# Patient Record
Sex: Female | Born: 1965
Health system: Southern US, Community
[De-identification: ages and names within clinical notes are randomized; demographics above are authoritative.]

## PROBLEM LIST (undated history)

## (undated) DIAGNOSIS — R05 Cough: Secondary | ICD-10-CM

## (undated) DIAGNOSIS — R209 Unspecified disturbances of skin sensation: Secondary | ICD-10-CM

## (undated) DIAGNOSIS — K219 Gastro-esophageal reflux disease without esophagitis: Secondary | ICD-10-CM

## (undated) DIAGNOSIS — R252 Cramp and spasm: Secondary | ICD-10-CM

## (undated) DIAGNOSIS — M79609 Pain in unspecified limb: Secondary | ICD-10-CM

## (undated) DIAGNOSIS — E785 Hyperlipidemia, unspecified: Secondary | ICD-10-CM

## (undated) DIAGNOSIS — D259 Leiomyoma of uterus, unspecified: Secondary | ICD-10-CM

## (undated) DIAGNOSIS — Z90710 Acquired absence of both cervix and uterus: Secondary | ICD-10-CM

## (undated) DIAGNOSIS — R5383 Other fatigue: Secondary | ICD-10-CM

## (undated) DIAGNOSIS — R259 Unspecified abnormal involuntary movements: Secondary | ICD-10-CM

## (undated) DIAGNOSIS — N926 Irregular menstruation, unspecified: Secondary | ICD-10-CM

## (undated) DIAGNOSIS — R1031 Right lower quadrant pain: Secondary | ICD-10-CM

## (undated) DIAGNOSIS — E669 Obesity, unspecified: Secondary | ICD-10-CM

## (undated) DIAGNOSIS — E559 Vitamin D deficiency, unspecified: Secondary | ICD-10-CM

## (undated) DIAGNOSIS — R5381 Other malaise: Secondary | ICD-10-CM

## (undated) DIAGNOSIS — D509 Iron deficiency anemia, unspecified: Secondary | ICD-10-CM

## (undated) DIAGNOSIS — F909 Attention-deficit hyperactivity disorder, unspecified type: Secondary | ICD-10-CM

## (undated) DIAGNOSIS — G4700 Insomnia, unspecified: Secondary | ICD-10-CM

## (undated) HISTORY — DX: Unspecified disturbances of skin sensation: R20.9

## (undated) HISTORY — DX: Gastro-esophageal reflux disease without esophagitis: K21.9

## (undated) HISTORY — DX: Unspecified abnormal involuntary movements: R25.9

## (undated) HISTORY — DX: Obesity, unspecified: E66.9

## (undated) HISTORY — DX: Hyperlipidemia, unspecified: E78.5

## (undated) HISTORY — DX: Cough: R05

## (undated) HISTORY — DX: Iron deficiency anemia, unspecified: D50.9

## (undated) HISTORY — DX: Vitamin D deficiency, unspecified: E55.9

## (undated) HISTORY — DX: Attention-deficit hyperactivity disorder, unspecified type: F90.9

## (undated) HISTORY — DX: Insomnia, unspecified: G47.00

## (undated) HISTORY — PX: CYST REMOVAL HAND: SHX6279

## (undated) HISTORY — DX: Right lower quadrant pain: R10.31

## (undated) HISTORY — PX: BREAST SURGERY: SHX581

## (undated) HISTORY — DX: Pain in unspecified limb: M79.609

## (undated) HISTORY — DX: Other malaise: R53.81

## (undated) HISTORY — DX: Irregular menstruation, unspecified: N92.6

## (undated) HISTORY — DX: Cramp and spasm: R25.2

## (undated) HISTORY — DX: Acquired absence of both cervix and uterus: Z90.710

## (undated) HISTORY — PX: OOPHORECTOMY: SHX86

## (undated) HISTORY — DX: Leiomyoma of uterus, unspecified: D25.9

## (undated) HISTORY — PX: CHOLECYSTECTOMY: SHX55

## (undated) HISTORY — DX: Other fatigue: R53.83

---

## 2002-08-11 ENCOUNTER — Other Ambulatory Visit: Admission: RE | Admit: 2002-08-11 | Discharge: 2002-08-11 | Payer: Self-pay | Admitting: Obstetrics & Gynecology

## 2003-10-04 ENCOUNTER — Other Ambulatory Visit: Admission: RE | Admit: 2003-10-04 | Discharge: 2003-10-04 | Payer: Self-pay | Admitting: Obstetrics & Gynecology

## 2005-05-06 ENCOUNTER — Ambulatory Visit: Payer: Self-pay | Admitting: Family Medicine

## 2005-10-06 ENCOUNTER — Encounter: Admission: RE | Admit: 2005-10-06 | Discharge: 2005-10-06 | Payer: Self-pay | Admitting: Obstetrics & Gynecology

## 2005-11-16 ENCOUNTER — Ambulatory Visit: Payer: Self-pay | Admitting: Internal Medicine

## 2007-02-09 ENCOUNTER — Encounter: Admission: RE | Admit: 2007-02-09 | Discharge: 2007-02-09 | Payer: Self-pay | Admitting: Obstetrics & Gynecology

## 2007-08-08 DIAGNOSIS — E785 Hyperlipidemia, unspecified: Secondary | ICD-10-CM | POA: Insufficient documentation

## 2007-08-08 DIAGNOSIS — K219 Gastro-esophageal reflux disease without esophagitis: Secondary | ICD-10-CM

## 2007-08-08 HISTORY — DX: Hyperlipidemia, unspecified: E78.5

## 2007-08-08 HISTORY — DX: Gastro-esophageal reflux disease without esophagitis: K21.9

## 2007-11-16 ENCOUNTER — Ambulatory Visit: Payer: Self-pay | Admitting: Internal Medicine

## 2007-11-16 DIAGNOSIS — G4700 Insomnia, unspecified: Secondary | ICD-10-CM | POA: Insufficient documentation

## 2007-11-16 HISTORY — DX: Insomnia, unspecified: G47.00

## 2007-12-19 ENCOUNTER — Ambulatory Visit: Payer: Self-pay | Admitting: Internal Medicine

## 2008-03-01 ENCOUNTER — Encounter: Admission: RE | Admit: 2008-03-01 | Discharge: 2008-03-01 | Payer: Self-pay | Admitting: Obstetrics & Gynecology

## 2008-11-26 ENCOUNTER — Encounter: Payer: Self-pay | Admitting: Internal Medicine

## 2009-03-04 ENCOUNTER — Encounter: Admission: RE | Admit: 2009-03-04 | Discharge: 2009-03-04 | Payer: Self-pay | Admitting: Obstetrics & Gynecology

## 2009-04-16 ENCOUNTER — Emergency Department (HOSPITAL_COMMUNITY): Admission: EM | Admit: 2009-04-16 | Discharge: 2009-04-16 | Payer: Self-pay | Admitting: Emergency Medicine

## 2009-04-17 ENCOUNTER — Telehealth (INDEPENDENT_AMBULATORY_CARE_PROVIDER_SITE_OTHER): Payer: Self-pay | Admitting: *Deleted

## 2009-04-18 ENCOUNTER — Ambulatory Visit: Payer: Self-pay | Admitting: Family Medicine

## 2009-05-03 ENCOUNTER — Ambulatory Visit: Payer: Self-pay | Admitting: Internal Medicine

## 2009-06-24 ENCOUNTER — Ambulatory Visit: Payer: Self-pay | Admitting: Internal Medicine

## 2009-06-24 DIAGNOSIS — E669 Obesity, unspecified: Secondary | ICD-10-CM

## 2009-06-24 DIAGNOSIS — R252 Cramp and spasm: Secondary | ICD-10-CM | POA: Insufficient documentation

## 2009-06-24 DIAGNOSIS — R5381 Other malaise: Secondary | ICD-10-CM | POA: Insufficient documentation

## 2009-06-24 DIAGNOSIS — N926 Irregular menstruation, unspecified: Secondary | ICD-10-CM | POA: Insufficient documentation

## 2009-06-24 DIAGNOSIS — R5383 Other fatigue: Secondary | ICD-10-CM

## 2009-06-24 HISTORY — DX: Other malaise: R53.81

## 2009-06-24 HISTORY — DX: Obesity, unspecified: E66.9

## 2009-06-24 HISTORY — DX: Cramp and spasm: R25.2

## 2009-06-24 HISTORY — DX: Irregular menstruation, unspecified: N92.6

## 2009-06-24 LAB — CONVERTED CEMR LAB
Albumin: 4.1 g/dL (ref 3.5–5.2)
Anti Nuclear Antibody(ANA): NEGATIVE
BUN: 9 mg/dL (ref 6–23)
Basophils Absolute: 0.2 10*3/uL — ABNORMAL HIGH (ref 0.0–0.1)
CRP, High Sensitivity: 5 (ref 0.00–5.00)
Calcium: 9.1 mg/dL (ref 8.4–10.5)
Cholesterol: 213 mg/dL — ABNORMAL HIGH (ref 0–200)
Eosinophils Absolute: 0.2 10*3/uL (ref 0.0–0.7)
Free T4: 0.9 ng/dL (ref 0.6–1.6)
GFR calc non Af Amer: 96.15 mL/min (ref >60)
Glucose, Bld: 79 mg/dL (ref 70–99)
HDL: 57.1 mg/dL (ref >39.00)
Hgb A1c MFr Bld: 5.9 % (ref 4.6–6.5)
Lymphocytes Relative: 19.2 % (ref 12.0–46.0)
MCHC: 34 g/dL (ref 30.0–36.0)
Monocytes Relative: 2.4 % — ABNORMAL LOW (ref 3.0–12.0)
Neutrophils Relative %: 75.7 % (ref 43.0–77.0)
Platelets: 395 10*3/uL (ref 150.0–400.0)
Potassium: 3.9 meq/L (ref 3.5–5.1)
RDW: 14.4 % (ref 11.5–14.6)
Sodium: 138 meq/L (ref 135–145)
Total Bilirubin: 0.8 mg/dL (ref 0.3–1.2)
Total CHOL/HDL Ratio: 4
Total CK: 87 units/L (ref 7–177)
VLDL: 10.8 mg/dL (ref 0.0–40.0)
Vit D, 25-Hydroxy: 23 ng/mL — ABNORMAL LOW (ref 30–89)
Vitamin B-12: 471 pg/mL (ref 211–911)

## 2009-06-26 ENCOUNTER — Encounter: Payer: Self-pay | Admitting: Internal Medicine

## 2009-07-05 ENCOUNTER — Telehealth: Payer: Self-pay | Admitting: *Deleted

## 2009-07-10 LAB — CONVERTED CEMR LAB: Volume, Urine-CORTUR: 2800 mL

## 2009-11-11 ENCOUNTER — Ambulatory Visit: Payer: Self-pay | Admitting: Internal Medicine

## 2009-11-11 LAB — CONVERTED CEMR LAB: Vit D, 25-Hydroxy: 26 ng/mL — ABNORMAL LOW (ref 30–89)

## 2010-01-07 ENCOUNTER — Telehealth: Payer: Self-pay | Admitting: *Deleted

## 2010-04-03 ENCOUNTER — Encounter: Admission: RE | Admit: 2010-04-03 | Discharge: 2010-04-03 | Payer: Self-pay | Admitting: Internal Medicine

## 2010-04-09 ENCOUNTER — Ambulatory Visit: Payer: Self-pay | Admitting: Family Medicine

## 2010-04-09 DIAGNOSIS — R059 Cough, unspecified: Secondary | ICD-10-CM

## 2010-04-09 DIAGNOSIS — R05 Cough: Secondary | ICD-10-CM

## 2010-04-09 HISTORY — DX: Cough, unspecified: R05.9

## 2010-05-22 ENCOUNTER — Telehealth: Payer: Self-pay | Admitting: Internal Medicine

## 2010-06-02 ENCOUNTER — Ambulatory Visit: Payer: Self-pay | Admitting: Internal Medicine

## 2010-06-02 DIAGNOSIS — E559 Vitamin D deficiency, unspecified: Secondary | ICD-10-CM

## 2010-06-02 HISTORY — DX: Vitamin D deficiency, unspecified: E55.9

## 2010-06-05 ENCOUNTER — Encounter: Payer: Self-pay | Admitting: *Deleted

## 2010-06-05 LAB — CONVERTED CEMR LAB
ALT: 31 U/L
AST: 17 U/L
Albumin: 3.5 g/dL
Alkaline Phosphatase: 63 U/L
BUN: 16 mg/dL
Basophils Absolute: 0.1 10*3/uL
Basophils Relative: 0.9 %
Bilirubin, Direct: 0.1 mg/dL
CO2: 26 meq/L
Calcium: 8.8 mg/dL
Chloride: 111 meq/L
Creatinine, Ser: 0.7 mg/dL
Eosinophils Absolute: 0.3 10*3/uL
Eosinophils Relative: 2.6 %
Ferritin: 8.3 ng/mL — ABNORMAL LOW
Free T4: 0.95 ng/dL
GFR calc non Af Amer: 99 mL/min
Glucose, Bld: 94 mg/dL
HCT: 36.3 %
Hemoglobin: 12.5 g/dL
Lymphocytes Relative: 22 %
Lymphs Abs: 2.4 10*3/uL
MCHC: 34.4 g/dL
MCV: 85.3 fL
Monocytes Absolute: 1 10*3/uL
Monocytes Relative: 9.7 %
Neutro Abs: 6.9 10*3/uL
Neutrophils Relative %: 64.8 %
Platelets: 431 10*3/uL — ABNORMAL HIGH
Potassium: 4.4 meq/L
RBC: 4.26 M/uL
RDW: 14.7 % — ABNORMAL HIGH
Sodium: 140 meq/L
T3, Free: 2.7 pg/mL
TSH: 1.05 u[IU]/mL
Total Bilirubin: 0.2 mg/dL — ABNORMAL LOW
Total Protein: 6.2 g/dL
WBC: 10.7 10*3/uL — ABNORMAL HIGH

## 2010-07-02 ENCOUNTER — Ambulatory Visit: Payer: Self-pay | Admitting: Internal Medicine

## 2010-07-02 DIAGNOSIS — R259 Unspecified abnormal involuntary movements: Secondary | ICD-10-CM

## 2010-07-02 DIAGNOSIS — D509 Iron deficiency anemia, unspecified: Secondary | ICD-10-CM | POA: Insufficient documentation

## 2010-07-02 HISTORY — DX: Unspecified abnormal involuntary movements: R25.9

## 2010-07-02 HISTORY — DX: Iron deficiency anemia, unspecified: D50.9

## 2010-08-08 ENCOUNTER — Telehealth: Payer: Self-pay | Admitting: *Deleted

## 2010-08-27 ENCOUNTER — Ambulatory Visit: Payer: Self-pay | Admitting: Internal Medicine

## 2010-08-27 LAB — CONVERTED CEMR LAB
Basophils Relative: 0.5 % (ref 0.0–3.0)
Eosinophils Relative: 3.3 % (ref 0.0–5.0)
Lymphocytes Relative: 23.6 % (ref 12.0–46.0)
Neutrophils Relative %: 63.6 % (ref 43.0–77.0)
RBC: 4.39 M/uL (ref 3.87–5.11)
Saturation Ratios: 26.8 % (ref 20.0–50.0)
Transferrin: 290.9 mg/dL (ref 212.0–360.0)
WBC: 8.8 10*3/uL (ref 4.5–10.5)

## 2010-09-02 HISTORY — PX: ABDOMINAL HYSTERECTOMY: SHX81

## 2010-09-03 ENCOUNTER — Ambulatory Visit: Payer: Self-pay | Admitting: Internal Medicine

## 2010-09-03 DIAGNOSIS — D259 Leiomyoma of uterus, unspecified: Secondary | ICD-10-CM | POA: Insufficient documentation

## 2010-09-03 HISTORY — DX: Leiomyoma of uterus, unspecified: D25.9

## 2010-09-17 ENCOUNTER — Encounter: Payer: Self-pay | Admitting: Internal Medicine

## 2010-09-18 ENCOUNTER — Encounter: Payer: Self-pay | Admitting: Internal Medicine

## 2010-09-24 ENCOUNTER — Ambulatory Visit (HOSPITAL_COMMUNITY): Admission: RE | Admit: 2010-09-24 | Discharge: 2010-09-24 | Payer: Self-pay | Admitting: Obstetrics & Gynecology

## 2010-09-24 ENCOUNTER — Encounter: Payer: Self-pay | Admitting: Internal Medicine

## 2010-09-24 ENCOUNTER — Encounter (INDEPENDENT_AMBULATORY_CARE_PROVIDER_SITE_OTHER): Payer: Self-pay | Admitting: Obstetrics & Gynecology

## 2010-10-02 ENCOUNTER — Telehealth: Payer: Self-pay | Admitting: Internal Medicine

## 2010-10-03 ENCOUNTER — Ambulatory Visit: Payer: Self-pay | Admitting: Internal Medicine

## 2010-10-03 ENCOUNTER — Ambulatory Visit: Payer: Self-pay

## 2010-10-03 DIAGNOSIS — R209 Unspecified disturbances of skin sensation: Secondary | ICD-10-CM

## 2010-10-03 DIAGNOSIS — Z90710 Acquired absence of both cervix and uterus: Secondary | ICD-10-CM

## 2010-10-03 DIAGNOSIS — M79609 Pain in unspecified limb: Secondary | ICD-10-CM | POA: Insufficient documentation

## 2010-10-03 DIAGNOSIS — R1031 Right lower quadrant pain: Secondary | ICD-10-CM | POA: Insufficient documentation

## 2010-10-03 HISTORY — DX: Right lower quadrant pain: R10.31

## 2010-10-03 HISTORY — DX: Unspecified disturbances of skin sensation: R20.9

## 2010-10-03 HISTORY — DX: Pain in unspecified limb: M79.609

## 2010-10-03 HISTORY — DX: Acquired absence of both cervix and uterus: Z90.710

## 2010-10-08 ENCOUNTER — Telehealth: Payer: Self-pay | Admitting: Internal Medicine

## 2010-10-17 ENCOUNTER — Encounter: Payer: Self-pay | Admitting: Family Medicine

## 2010-10-17 ENCOUNTER — Ambulatory Visit: Payer: Self-pay | Admitting: Family Medicine

## 2010-10-20 LAB — CONVERTED CEMR LAB
Basophils Relative: 0.7 % (ref 0.0–3.0)
Eosinophils Absolute: 0.2 10*3/uL (ref 0.0–0.7)
Eosinophils Relative: 2.4 % (ref 0.0–5.0)
HCT: 41 % (ref 36.0–46.0)
Hemoglobin: 13.7 g/dL (ref 12.0–15.0)
MCHC: 33.4 g/dL (ref 30.0–36.0)
MCV: 88.2 fL (ref 78.0–100.0)
Monocytes Absolute: 0.7 10*3/uL (ref 0.1–1.0)
Neutro Abs: 5.9 10*3/uL (ref 1.4–7.7)
RBC: 4.66 M/uL (ref 3.87–5.11)

## 2010-10-21 ENCOUNTER — Encounter
Admission: RE | Admit: 2010-10-21 | Discharge: 2010-10-21 | Payer: Self-pay | Source: Home / Self Care | Attending: Obstetrics & Gynecology | Admitting: Obstetrics & Gynecology

## 2010-10-30 ENCOUNTER — Ambulatory Visit
Admission: RE | Admit: 2010-10-30 | Discharge: 2010-10-30 | Payer: Self-pay | Source: Home / Self Care | Attending: Internal Medicine | Admitting: Internal Medicine

## 2010-11-06 ENCOUNTER — Ambulatory Visit
Admission: RE | Admit: 2010-11-06 | Discharge: 2010-11-06 | Payer: Self-pay | Source: Home / Self Care | Attending: Internal Medicine | Admitting: Internal Medicine

## 2010-11-10 ENCOUNTER — Encounter: Payer: Self-pay | Admitting: Internal Medicine

## 2010-11-23 ENCOUNTER — Encounter: Payer: Self-pay | Admitting: Obstetrics & Gynecology

## 2010-11-26 ENCOUNTER — Encounter: Payer: Self-pay | Admitting: Internal Medicine

## 2010-11-26 ENCOUNTER — Other Ambulatory Visit: Payer: Self-pay | Admitting: Internal Medicine

## 2010-11-26 ENCOUNTER — Ambulatory Visit
Admission: RE | Admit: 2010-11-26 | Discharge: 2010-11-26 | Payer: Self-pay | Source: Home / Self Care | Attending: Internal Medicine | Admitting: Internal Medicine

## 2010-11-26 LAB — CBC WITH DIFFERENTIAL/PLATELET
Basophils Absolute: 0.1 10*3/uL (ref 0.0–0.1)
Eosinophils Relative: 2.4 % (ref 0.0–5.0)
HCT: 38.3 % (ref 36.0–46.0)
Lymphs Abs: 2.1 10*3/uL (ref 0.7–4.0)
Monocytes Absolute: 0.7 10*3/uL (ref 0.1–1.0)
Monocytes Relative: 8 % (ref 3.0–12.0)
Neutrophils Relative %: 65.7 % (ref 43.0–77.0)
Platelets: 383 10*3/uL (ref 150.0–400.0)
RDW: 14.9 % — ABNORMAL HIGH (ref 11.5–14.6)
WBC: 8.8 10*3/uL (ref 4.5–10.5)

## 2010-11-26 LAB — IBC PANEL
Iron: 73 ug/dL (ref 42–145)
Saturation Ratios: 18.4 % — ABNORMAL LOW (ref 20.0–50.0)
Transferrin: 284 mg/dL (ref 212.0–360.0)

## 2010-11-26 LAB — FERRITIN: Ferritin: 17.4 ng/mL (ref 10.0–291.0)

## 2010-11-30 LAB — CONVERTED CEMR LAB
Alkaline Phosphatase: 110 units/L (ref 39–117)
Basophils Absolute: 0.1 10*3/uL (ref 0.0–0.1)
Bilirubin, Direct: 0.1 mg/dL (ref 0.0–0.3)
Eosinophils Relative: 1.9 % (ref 0.0–5.0)
GFR calc non Af Amer: 100.56 mL/min (ref >60)
Glucose, Bld: 123 mg/dL — ABNORMAL HIGH (ref 70–99)
Ketones, urine, test strip: NEGATIVE
MCV: 88.8 fL (ref 78.0–100.0)
Monocytes Absolute: 1.2 10*3/uL — ABNORMAL HIGH (ref 0.1–1.0)
Neutrophils Relative %: 77.4 % — ABNORMAL HIGH (ref 43.0–77.0)
Nitrite: NEGATIVE
Platelets: 509 10*3/uL — ABNORMAL HIGH (ref 150.0–400.0)
Potassium: 4.4 meq/L (ref 3.5–5.1)
RDW: 14.8 % — ABNORMAL HIGH (ref 11.5–14.6)
Sodium: 143 meq/L (ref 135–145)
TSH: 0.75 microintl units/mL (ref 0.35–5.50)
Total Bilirubin: 0.6 mg/dL (ref 0.3–1.2)
Urobilinogen, UA: 0.2
WBC: 14.7 10*3/uL — ABNORMAL HIGH (ref 4.5–10.5)

## 2010-12-02 NOTE — Assessment & Plan Note (Signed)
Summary: cough lingering/dm   Vital Signs:  Patient profile:   45 year old female Menstrual status:  irregular Weight:      202 pounds Temp:     98.1 degrees F oral BP sitting:   110 / 80  (left arm) Cuff size:   large CC: Lingering cough, Cough   History of Present Illness:  Cough      This is a 45 year old woman who presents with Cough.  The patient reports non-productive cough, shortness of breath, and wheezing, but denies productive cough, pleuritic chest pain, exertional dyspnea, fever, and hemoptysis.  Associated symtpoms include acid reflux symptoms.  The patient denies the following symptoms: cold/URI symptoms, sore throat, nasal congestion, chronic rhinitis, weight loss, and peripheral edema.  Risk factors include history of reflux.  Diagnostic testing to date has included CXR (at urgent care 3 months ago).  Cough of 3 months duration.  Has freq GERD symptoms usually controlled with H2 blocker. No smoking hx.  No hx of asthma.   Current Medications (verified): 1)  Vitamin D (Ergocalciferol) 50000 Unit Caps (Ergocalciferol) .Marland Kitchen.. 1 By Mouth Weekly  Allergies (verified): No Known Drug Allergies  Past History:  Past Medical History: Last updated: 08/08/2007 GERD Hyperlipidemia  Social History: Last updated: 06/24/2009 Occupation: Single Never Smoked Alcohol use-yes Drug use-no Regular exercise-yes HH of  1   3 [employer]  PMH reviewed for relevance, SH/Risk Factors reviewed for relevance  Review of Systems  The patient denies anorexia, fever, weight loss, hoarseness, chest pain, peripheral edema, and hemoptysis.    Physical Exam  General:  Well-developed,well-nourished,in no acute distress; alert,appropriate and cooperative throughout examination Head:  Normocephalic and atraumatic without obvious abnormalities. No apparent alopecia or balding. Ears:  External ear exam shows no significant lesions or deformities.  Otoscopic examination reveals clear canals,  tympanic membranes are intact bilaterally without bulging, retraction, inflammation or discharge. Hearing is grossly normal bilaterally. Mouth:  Oral mucosa and oropharynx without lesions or exudates.  Teeth in good repair. Neck:  No deformities, masses, or tenderness noted. Lungs:  Normal respiratory effort, chest expands symmetrically. Lungs are clear to auscultation, no crackles or wheezes. Heart:  Normal rate and regular rhythm. S1 and S2 normal without gallop, murmur, click, rub or other extra sounds. Extremities:  no edema   Impression & Recommendations:  Problem # 1:  COUGH, CHRONIC (ICD-786.2) Assessment New ?GERD related.  No hx asthma and does not appear to have any allergy or chronic sinusitis symptoms. Start PPI and diet discussed.  Elevate head of bed.  Cough syrup for suppression.  Problem # 2:  GERD (ICD-530.81)  Her updated medication list for this problem includes:    Omeprazole 20 Mg Cpdr (Omeprazole) ..... One by mouth once daily  Complete Medication List: 1)  Vitamin D (ergocalciferol) 50000 Unit Caps (Ergocalciferol) .Marland Kitchen.. 1 by mouth weekly 2)  Hydrocodone-homatropine 5-1.5 Mg/77ml Syrp (Hydrocodone-homatropine) .... One tsp by mouth q 4-6 hours as needed cough 3)  Omeprazole 20 Mg Cpdr (Omeprazole) .... One by mouth once daily  Patient Instructions: 1)  Elevate head of bed 6 to 8 inches. 2)  Take Omeprazole daily. 3)  Avoid eating within 3 hours of bedtime. 4)  Follow up with Dr Darrell Jewel in 3-4 weeks if no better. Prescriptions: VITAMIN D (ERGOCALCIFEROL) 50000 UNIT CAPS (ERGOCALCIFEROL) 1 by mouth weekly  #12 x 1   Entered and Authorized by:   Evelena Peat MD   Signed by:   Evelena Peat MD on 04/09/2010  Method used:   Electronically to        [facility]  [phone]* (retail)       [address] [place], [place]  [address]       Ph: [phone] or [phone]       Fax: [phone]   RxID:   [id number] OMEPRAZOLE 20 MG CPDR  (OMEPRAZOLE) one by mouth once daily  #30 x 3   Entered and Authorized by:   Evelena Peat MD   Signed by:   Evelena Peat MD on 04/09/2010   Method used:   Electronically to        [facility]  [phone]* (retail)       [address] [place], [place]  [address]       Ph: [phone] or [phone]       Fax: [phone]   RxID:   [id number] HYDROCODONE-HOMATROPINE 5-1.5 MG/5ML SYRP (HYDROCODONE-HOMATROPINE) one tsp by mouth q 4-6 hours as needed cough  #120 ml x 0   Entered and Authorized by:   Evelena Peat MD   Signed by:   Evelena Peat MD on 04/09/2010   Method used:   Print then Give to Patient   RxID:   [id number]

## 2010-12-02 NOTE — Consult Note (Signed)
Summary: [facility]  [facility]   Imported By: Maryln Gottron 09/23/2010 10:20:40  _____________________________________________________________________  External Attachment:    Type:   Image     Comment:   External Document

## 2010-12-02 NOTE — Assessment & Plan Note (Signed)
Summary: 1 month follow up/cjr   Vital Signs:  Patient profile:   45 year old female Menstrual status:  irregular Weight:      198 pounds Pulse rate:   86 / minute BP sitting:   110 / 80  (left arm) Cuff size:   regular  Vitals Entered By: Romualdo Bolk, CMA (AAMA) (July 02, 2010 4:06 PM) CC: follow-up visit   History of Present Illness: Tiffany Carson comes in today   for follow up of a number of problems. Since her last bisit sleep poss soome improvement . Asks about meds and melatonins to use. Also gets leg cramps and started iron.  Has righ had tremor for a while that is getting worse affecting her handwriting  . no weakness sudden onset or association with a given med.  GERD :   taking prilosec   with help  To have gyne  surgery at some point  for her fibroid poss contibuting to her  anemia.  Preventive Screening-Counseling & Management  Alcohol-Tobacco     Alcohol drinks/day: 0     Smoking Status: never  Caffeine-Diet-Exercise     Caffeine use/day: 0     Does Patient Exercise: yes     Type of exercise: walking     Times/week: 2  Current Medications (verified): 1)  Vitamin D (Ergocalciferol) 50000 Unit Caps (Ergocalciferol) .Marland Kitchen.. 1 By Mouth Weekly 2)  Omeprazole 20 Mg Cpdr (Omeprazole) .... One By Mouth Once Daily 3)  Ambien 10 Mg Tabs (Zolpidem Tartrate) .Marland Kitchen.. 1 To 1 and 1/2  By Mouth At Bedtime As Needed Sleep  Allergies (verified): 1)  ! Dexamethasone (Dexamethasone)  Past History:  Past medical, surgical, family and social histories (including risk factors) reviewed, and no changes noted (except as noted below).  Past Medical History: Reviewed history from 06/02/2010 and no changes required. GERD  fibroid tumor     may n  Past Surgical History: Reviewed history from 08/08/2007 and no changes required. Cholecystectomy Oophorectomy  Past History:  Care Management: Gynecology:[facility]  Urgent Care: [facility]   Family  History: Reviewed history from 11/16/2007 and no changes required. Family History of Alcoholism/Addiction bro Family History of Arthritis Family History Breast cancer 1st degree relative <50 Family History Diabetes 1st degree relative Family History High cholesterol Family History Hypertension Family History Ovarian cancer Family History of Sudden Death Family History Uterine cancer  no fam hx other mental illness  Social History: Reviewed history from 06/24/2009 and no changes required. Occupation: Single Never Smoked Alcohol use-yes Drug use-no Regular exercise-yes HH of  1   3 [employer]   Review of Systems  The patient denies anorexia, fever, weight loss, weight gain, vision loss, decreased hearing, chest pain, syncope, prolonged cough, melena, hematochezia, hematuria, difficulty walking, depression, enlarged lymph nodes, and angioedema.    Physical Exam  General:  alert, well-developed, well-nourished, and well-hydrated.   Eyes:  vision grossly intact.   Neck:  No deformities, masses, or tenderness noted. Lungs:  normal respiratory effort, no intercostal retractions, no accessory muscle use, and normal breath sounds.   Heart:  normal rate, regular rhythm, and no murmur.   Msk:  no joint warmth and no redness over joints.   Pulses:  pulses intact without delay   Extremities:  no clubbing cyanosis or edema  Neurologic:  alert & oriented X3, strength normal in all extremities, and gait normal.  right hand with a fine intention tremor  and handwriting  erratice  and halting  but  confluent  Skin:  turgor normal, color normal, no ecchymoses, no petechiae, and no purpura.   Cervical Nodes:  No lymphadenopathy noted Psych:  Oriented X3, normally interactive, good eye contact, not anxious appearing, and not depressed appearing.     Impression & Recommendations:  Problem # 1:  ANEMIA, IRON DEFICIENCY (ICD-280.9)  could be contributing to sleep issues   no t anemic  now but  is iron deficient   Hgb: 12.5 (06/02/2010)   Hct: 36.3 (06/02/2010)   Platelets: 431.0 (06/02/2010) RBC: 4.26 (06/02/2010)   RDW: 14.7 (06/02/2010)   WBC: 10.7 (06/02/2010) MCV: 85.3 (06/02/2010)   MCHC: 34.4 (06/02/2010) Ferritin: 8.3 (06/02/2010) B12: 471 (06/24/2009)   Folate: 12.8 (06/24/2009)   TSH: 1.05 (06/02/2010)  Problem # 2:  SLEEPLESSNESS (ICD-780.52) ok to use melatonin  other  concern about   ingredient   purity  Her updated medication list for this problem includes:    Ambien 10 Mg Tabs (Zolpidem tartrate) .Marland Kitchen... 1 to 1 and 1/2  by mouth at bedtime as needed sleep  Problem # 3:  LEG CRAMPS (ICD-729.82) poss better ? from  iron deficicny.   Problem # 4:  TREMOR (ICD-781.0)  right hand now affecting  her handwriting   ? essential bvs  othe tremor.  fam hx of parkinsons and tremor great aunt.   Orders: Neurology Referral (Neuro)  Problem # 5:  GERD (ICD-530.81) Assessment: Unchanged  Her updated medication list for this problem includes:    Omeprazole 20 Mg Cpdr (Omeprazole) ..... One by mouth once daily  Complete Medication List: 1)  Vitamin D (ergocalciferol) 50000 Unit Caps (Ergocalciferol) .Marland Kitchen.. 1 by mouth weekly 2)  Omeprazole 20 Mg Cpdr (Omeprazole) .... One by mouth once daily 3)  Ambien 10 Mg Tabs (Zolpidem tartrate) .Marland Kitchen.. 1 to 1 and 1/2  by mouth at bedtime as needed sleep  Other Orders: Admin 1st Vaccine ([address]) Flu Vaccine 66yrs + (60454)  Patient Instructions: 1)  will contact you  about neuro referral  about the tremor. 2)  take iron two times a day if possible  better absorbed with vitamin C  250mg  . 3)  call us when surgery date decided and then plan follow up . 4)  otherwise  Ferritin ,CBC and diff and IBC panel in 2 months and then ROV  5)  prefer straight melatonin for sleep .     Flu Vaccine Consent Questions     Do you have a history of severe allergic reactions to this vaccine? no    Any prior history of allergic reactions to egg and/or  gelatin? no    Do you have a sensitivity to the preservative Thimersol? no    Do you have a past history of Guillan-Barre Syndrome? no    Do you currently have an acute febrile illness? no    Have you ever had a severe reaction to latex? no    Vaccine information given and explained to patient? yes    Are you currently pregnant? no    Lot Number:[id number]   Exp Date:05/02/2011   Site Given  Left Deltoid IMu Romualdo Bolk, CMA (AAMA)  July 02, 2010 4:07 PM

## 2010-12-02 NOTE — Miscellaneous (Signed)
Summary: Orders Update  Clinical Lists Changes  Problems: Added new problem of LEG PAIN, RIGHT (ICD-729.5) Orders: Added new Test order of Venous Duplex Lower Extremity (Venous Duplex Lower) - Signed 

## 2010-12-02 NOTE — Progress Notes (Signed)
Summary: numbness in hands  Phone Note Call from Patient Call back at Home Phone [phone]   Caller: Patient Call For: Madelin Headings MD Summary of Call: Hysterectomy Nov. 23, 2011, and is now having tingling of both hands.  GYN states she needs to see Primary Care.  Also, complains of cramping in right lower leg, no hotness, not swollen.  The cramping comes and goes.  Initial call taken by: Lynann Beaver CMA AAMA,  October 02, 2010 10:05 AM  Follow-up for Phone Call        dont know what all of this means   need  hosp discharge summaryop note etc and   labs   that werer done  in hospital for review and and  then can do OV  on MOnday with the information    I  there  is anyconcernbout blood clots with leg pain   or cannot wait  until monday  then she can see other doc this pm or     be seen in ED .  Follow-up by: Madelin Headings MD,  October 02, 2010 11:45 AM  Additional Follow-up for Phone Call Additional follow up Details #1::        Does not sound suspicious of a blood clot.  Op notes printed. Pt does not want to wait until Monday.  Will come in tomorrow.  Does not want to go to the ER as the GYN doctors feels she  needs to see Primary Care and they do not feel it is emergent. Additional Follow-up by: Lynann Beaver CMA AAMA,  October 02, 2010 1:28 PM

## 2010-12-02 NOTE — Progress Notes (Signed)
Summary: FYI-  Phone Note Call from Patient Call back at [facility] Phone [phone]   Caller: Patient Summary of Call: Pt called saying that she saw her gyn and she has a post op infection. They put her on flagyl.  Initial call taken by: Romualdo Bolk, CMA Duncan Dull),  October 08, 2010 9:17 AM  Follow-up for Phone Call        noted  Follow-up by: Madelin Headings MD,  October 08, 2010 9:40 AM

## 2010-12-02 NOTE — Assessment & Plan Note (Signed)
Summary: 2 month fup//ccm   Vital Signs:  Patient profile:   45 year old female Menstrual status:  irregular LMP:     08/27/2010 Weight:      197 pounds Pulse rate:   80 / minute BP sitting:   120 / 80  (right arm) Cuff size:   regular  Vitals Entered By: Romualdo Bolk, CMA (AAMA) (September 03, 2010 3:42 PM) CC: Follow-up visit on labs LMP (date): 08/27/2010 LMP - Character: normal Menarche (age onset years): 11   Menses interval (days): 14-21 Menstrual flow (days): 4 Enter LMP: 08/27/2010   History of Present Illness: Tiffany Carson  for follow up of iron deficiciency. Sincre last visit her leg cramps are better  is still taking iron and is not finished with vit d course.    November 23rd to fibroid  removal .  Has appt for tremor eval in NOv 16 for neuro.  ? if had  issues with ambien sleep    lights were on in her room but no documented sleep walking or  other behaviors.     Preventive Screening-Counseling & Management  Alcohol-Tobacco     Alcohol drinks/day: 0     Smoking Status: never  Caffeine-Diet-Exercise     Caffeine use/day: 0     Does Patient Exercise: yes     Type of exercise: walking     Times/week: 2  Current Medications (verified): 1)  Omeprazole 20 Mg Cpdr (Omeprazole) .... One By Mouth Once Daily 2)  Ambien 10 Mg Tabs (Zolpidem Tartrate) .Marland Kitchen.. 1 To 1 and 1/2  By Mouth At Bedtime As Needed Sleep 3)  Ferrous Sulfate 325 (65 Fe) Mg Tabs (Ferrous Sulfate) .Marland Kitchen.. 1 By Mouth Two Times A Day  Allergies (verified): 1)  ! Dexamethasone (Dexamethasone)  Past History:  Past medical, surgical, family and social histories (including risk factors) reviewed for relevance to current acute and chronic problems.  Past Medical History: GERD fibroid tumor      Iron deficiency   Past Surgical History: Reviewed history from 08/08/2007 and no changes required. Cholecystectomy Oophorectomy  Past History:  Care Management: Gynecology:[facility]  Urgent  Care: [facility]   Family History: Reviewed history from 11/16/2007 and no changes required. Family History of Alcoholism/Addiction bro Family History of Arthritis Family History Breast cancer 1st degree relative <50 Family History Diabetes 1st degree relative Family History High cholesterol Family History Hypertension Family History Ovarian cancer Family History of Sudden Death Family History Uterine cancer  no fam hx other mental illness  Social History: Reviewed history from 06/24/2009 and no changes required. Occupation: Single Never Smoked Alcohol use-yes Drug use-no Regular exercise-yes HH of  1   3 [employer]   Review of Systems  The patient denies anorexia, fever, chest pain, hemoptysis, abdominal pain, difficulty walking, depression, enlarged lymph nodes, and angioedema.    Physical Exam  General:  Well-developed,well-nourished,in no acute distress; alert,appropriate and cooperative throughout examination Neurologic:  no tremor seen today  Psych:  Oriented X3, normally interactive, good eye contact, not anxious appearing, and not depressed appearing.     Impression & Recommendations:  Problem # 1:  ANEMIA, IRON DEFICIENCY (ICD-280.9) Assessment Improved  Her updated medication list for this problem includes:    Ferrous Sulfate 325 (65 Fe) Mg Tabs (Ferrous sulfate) .Marland Kitchen... 1 by mouth two times a day  Problem # 2:  VITAMIN D DEFICIENCY (ICD-268.9) Assessment: Comment Only finished  rx med   Problem # 3:  TREMOR (ICD-781.0)  Problem # 4:  FIBROIDS, UTERUS (ICD-218.9) to be removed by robotoc surgery this month  Problem # 5:  SLEEPLESSNESS (ICD-780.52) poss combo  / is se of med  sample  of silenor if needed  Her updated medication list for this problem includes:    Ambien 10 Mg Tabs (Zolpidem tartrate) .Marland Kitchen... 1 to 1 and 1/2  by mouth at bedtime as needed sleep    Silenor 6 Mg Tabs (Doxepin hcl) .Marland Kitchen... 1 by mouth hs for sleep  Problem # 6:   LEG CRAMPS (ICD-729.82) Assessment: Improved ? from iron defic  Complete Medication List: 1)  Omeprazole 20 Mg Cpdr (Omeprazole) .... One by mouth once daily 2)  Ambien 10 Mg Tabs (Zolpidem tartrate) .Marland Kitchen.. 1 to 1 and 1/2  by mouth at bedtime as needed sleep 3)  Ferrous Sulfate 325 (65 Fe) Mg Tabs (Ferrous sulfate) .Marland Kitchen.. 1 by mouth two times a day 4)  Silenor 6 Mg Tabs (Doxepin hcl) .Marland Kitchen.. 1 by mouth hs for sleep  Patient Instructions: 1)  take vit d 1000-2000iu  per day. 2)  can try off ambien  if wishes but could get slight rebound.  3)  Continue  t on     iron   as tolerated . 4)  check CBC diff and  IBC ferritin   vit D level in 2-3 months  and then return office visit .   5)  Dx iron deficiency  and vit d deficiency .    Orders Added: 1)  Est. Patient Level IV [16109]   greater than 50% of visit spent in counseling  25 minutes

## 2010-12-02 NOTE — Progress Notes (Signed)
Summary: sleeping rx  Phone Note Call from Patient Call back at [phone]   Caller: Patient Call For: Madelin Headings MD Reason for Call: Talk to Doctor Summary of Call: patient is calling because she is having a problem falling and staying asleep.  she does not drink soda in the afternoon or evenings.  she has tried [place] in the past.   [facility]  Follow-up for Phone Call        pt called following up on phone call from yesterday requesting medication for sleep.  Follow-up by: Duard Brady LPN,  May 23, 2010 2:05 PM  Additional Follow-up for Phone Call Additional follow up Details #1::        can disp ambien 10 mg  disp 10  no refills .  ROV   to evaluate.  Additional Follow-up by: Madelin Headings MD,  May 23, 2010 4:59 PM    Additional Follow-up for Phone Call Additional follow up Details #2::    added to med list - [facility] - [facility] to [facility] pt - home # listed has been disconnected.   #10 called in - needs rov to eval. KIK Follow-up by: Duard Brady LPN,  May 23, 2010 5:06 PM  New/Updated Medications: AMBIEN 10 MG TABS (ZOLPIDEM TARTRATE) 1 by mouth at bedtime as needed sleep Prescriptions: AMBIEN 10 MG TABS (ZOLPIDEM TARTRATE) 1 by mouth at bedtime as needed sleep  #10 x 0   Entered by:   Duard Brady LPN   Authorized by:   Madelin Headings MD   Signed by:   Duard Brady LPN on 44/11/270   Method used:   Historical   RxID:   [id number]   Appended Document: sleeping rx please call whatever number  she contacted [place] to make appt .   the pharmacy may have an updated .  one.   Appended Document: sleeping rx attempted to call at # left with phone note - voice mail - LMTCB and make rov - to eval need for sleep meds with Dr. Fabian Sharp. KIK

## 2010-12-02 NOTE — Op Note (Signed)
Summary: [facility]  [facility]   Imported By: Maryln Gottron 10/09/2010 09:16:55  _____________________________________________________________________  External Attachment:    Type:   Image     Comment:   External Document

## 2010-12-02 NOTE — Assessment & Plan Note (Signed)
Summary: fu on med/njr   Vital Signs:  Patient profile:   45 year old female Menstrual status:  irregular LMP:     05/18/2010 Height:      63 inches Weight:      196 pounds BMI:     34.85 Pulse rate:   66 / minute BP sitting:   110 / 70  (right arm) Cuff size:   regular  Vitals Entered By: Romualdo Bolk, CMA (AAMA) (June 02, 2010 3:16 PM) CC: Follow-up visit on meds LMP (date): 05/18/2010 LMP - Character: normal Menarche (age onset years): 11   Menses interval (days): 14-21 Menstrual flow (days): 4 Enter LMP: 05/18/2010   History of Present Illness: Tiffany Carson  comes in today  for problems with sleep. Last visit was  for med check was august 2010 . seen for cough 2 months ago.   AMbien gen not as good as brand in the remote past .    HAd not slept weell in   for 4-5 days  4-5  hours   . ? why.   Denied anxiety and caffiene.   ? if could be related to hormone  irregularity.     Period irregularity.    ambien helped some . denies sig depression some anxiety  Sometimes legs feel like have to  move and then get out of bed.   Not known to be anemic. GERD NO chagne  Vit d   refilled med last time seen by Dr Karen Kitchens. no recent check     Preventive Screening-Counseling & Management  Alcohol-Tobacco     Alcohol drinks/day: 0     Smoking Status: never  Caffeine-Diet-Exercise     Caffeine use/day: 0     Does Patient Exercise: yes     Type of exercise: walking     Times/week: 2  Current Medications (verified): 1)  Vitamin D (Ergocalciferol) 50000 Unit Caps (Ergocalciferol) .Marland Kitchen.. 1 By Mouth Weekly 2)  Omeprazole 20 Mg Cpdr (Omeprazole) .... One By Mouth Once Daily 3)  Ambien 10 Mg Tabs (Zolpidem Tartrate) .Marland Kitchen.. 1 By Mouth At Bedtime As Needed Sleep  Allergies (verified): 1)  ! Dexamethasone (Dexamethasone)  Past History:  Past medical, surgical, family and social histories (including risk factors) reviewed, and no changes noted (except as noted  below).  Past Medical History: GERD  fibroid tumor     may n  Past Surgical History: Reviewed history from 08/08/2007 and no changes required. Cholecystectomy Oophorectomy  Past History:  Care Management: Gynecology:[facility]  Urgent Care: [facility]   Family History: Reviewed history from 11/16/2007 and no changes required. Family History of Alcoholism/Addiction bro Family History of Arthritis Family History Breast cancer 1st degree relative <50 Family History Diabetes 1st degree relative Family History High cholesterol Family History Hypertension Family History Ovarian cancer Family History of Sudden Death Family History Uterine cancer  no fam hx other mental illness  Social History: Reviewed history from 06/24/2009 and no changes required. Occupation: Single Never Smoked Alcohol use-yes Drug use-no Regular exercise-yes HH of  1   3 [employer]   Review of Systems  The patient denies anorexia, fever, weight loss, vision loss, decreased hearing, chest pain, syncope, dyspnea on exertion, abdominal pain, melena, hematochezia, severe indigestion/heartburn, muscle weakness, difficulty walking, and abnormal bleeding.         ? restless leg   about a month ago but no recently.  Physical Exam  General:  alert, well-developed, well-nourished, and well-hydrated.   Head:  normocephalic, atraumatic, and no abnormalities observed.   Eyes:  vision grossly intact, pupils equal, and pupils round.   Ears:  no external deformities.   Mouth:  pharynx pink and moist.   Neck:  No deformities, masses, or tenderness noted. Lungs:  Normal respiratory effort, chest expands symmetrically. Lungs are clear to auscultation, no crackles or wheezes.no dullness.   Heart:  Normal rate and regular rhythm. S1 and S2 normal without gallop, murmur, click, rub or other extra sounds.no lifts.   Abdomen:  Bowel sounds positive,abdomen soft and non-tender without masses, organomegaly or    noted. Pulses:  pulses intact without delay   Extremities:  no clubbing cyanosis or edema  Neurologic:  alert & oriented X3, cranial nerves II-XII intact, strength normal in all extremities, and gait normal.   Skin:  turgor normal, color normal, no ecchymoses, and no petechiae.   Cervical Nodes:  No lymphadenopathy noted Psych:  Oriented X3, memory intact for recent and remote, normally interactive, good eye contact, not depressed appearing, and slightly anxious.     Impression & Recommendations:  Problem # 1:  SLEEPLESSNESS (ICD-780.52)  sleep hygiene discussed.    check for poss of low iron  poss rls signs in additin to other  factors Her updated medication list for this problem includes:    Ambien 10 Mg Tabs (Zolpidem tartrate) .Marland Kitchen... 1 to 1 and 1/2  by mouth at bedtime as needed sleep  Orders: Specimen Handling (16109) Venipuncture (60454) TLB-BMP (Basic Metabolic Panel-BMET) (80048-METABOL) TLB-CBC Platelet - w/Differential (85025-CBCD) TLB-TSH (Thyroid Stimulating Hormone) (84443-TSH) TLB-Ferritin (82728-FER) TLB-T4 (Thyrox), Free [phone]) TLB-T3, Free (Triiodothyronine) (84481-T3FREE)  Problem # 2:  VITAMIN D DEFICIENCY (ICD-268.9) check level  on rx med  Orders: T-Vitamin D (25-Hydroxy) (82956-21308)  Problem # 3:  OBESITY (ICD-278.00) weight loss advised  Ht: 63 (06/02/2010)   Wt: 196 (06/02/2010)   BMI: 34.85 (06/02/2010)  Problem # 4:  GERD (ICD-530.81) Assessment: Unchanged  Her updated medication list for this problem includes:    Omeprazole 20 Mg Cpdr (Omeprazole) ..... One by mouth once daily  Complete Medication List: 1)  Vitamin D (ergocalciferol) 50000 Unit Caps (Ergocalciferol) .Marland Kitchen.. 1 by mouth weekly 2)  Omeprazole 20 Mg Cpdr (Omeprazole) .... One by mouth once daily 3)  Ambien 10 Mg Tabs (Zolpidem tartrate) .Marland Kitchen.. 1 to 1 and 1/2  by mouth at bedtime as needed sleep  Other Orders: TLB-Hepatic/Liver Function Pnl (80076-HEPATIC)  Patient  Instructions: 1)  try samples of lunesta   3 mg    2)  consider a sleep diary. 3)  Otherwise can try  can use  10- 15 mg of generic ambien.  4)  You will be informed of lab results when available.  5)  rov in about a month . Prescriptions: AMBIEN 10 MG TABS (ZOLPIDEM TARTRATE) 1 to 1 and 1/2  by mouth at bedtime as needed sleep  #30 x 1   Entered and Authorized by:   Madelin Headings MD   Signed by:   Madelin Headings MD on 06/02/2010   Method used:   Print then Give to Patient   RxID:   [id number]

## 2010-12-02 NOTE — Assessment & Plan Note (Signed)
Summary: leg pain/ok per deb/njr   Vital Signs:  Patient profile:   45 year old female Menstrual status:  hysterectomy Weight:      189 pounds O2 Sat:      97 % Temp:     97.9 degrees F Pulse rate:   90 / minute BP sitting:   130 / 80  (left arm)  Vitals Entered By: Pura Spice, RN (October 03, 2010 8:14 AM) CC: states since her vaginal hysterectomy on Sep 24 2010 she has numbness in both wrist to fingers and rt calf muscles cramping with numbness. also,  wants you to know since surgery she feels pain rt side which she describes runs down side down to the ureters and having some bloody leakage. Dr Lenord Carbo  GYN  LMP - Character: normal Menarche (age onset years): 11   Menses interval (days): 14-21 Menstrual flow (days): 4 Menstrual Status hysterectomy   History of Present Illness: Tiffany Carson  comes in today  as an acute  problem  post op  from a laparoscopic total  hysterectomy  November 23rd for fibroids  .  She had right adnexa adhesions.  she began having some symptoms and  Called gyne office and told to see [place].   however she was seen by her GYN for some right lower quadrant pain near her groin  with some vaginal leakage but no fever. She has had some abdominal pain in that area with urination but the no burning or increase frequency. She apparently had some type of pelvic ultrasound within the last week not available to this clinician. There is lab work pending from her GYN.she was MR SA negative preop screen  She states the surgery went well and was taking pain medicine regularly Tylox until 3 days ago and just at night.  no chills night sweats. Decrease appetite.  This week she developed tingling and numbness feeling in both hands in a glove distribution .  and sometimes it is hard to pick up objects and hold onto them. There is no weakness in her arms and legs or numbness in her feet. There's no change in her tremor that had been diagnosed by neurology as a familial  problem. Hoit and cold flushes  are occuring but no nausea vomiting full-blooded diarrhea rash.she did not have a transfusion. She went home after the surgery within the day.  she also complains of a right leg crampy feeling that she says is different than her leg cramps restless like she is had in the past   NO  swelling or warmth or redness, no history of blood clots. this does not affect her walking  Sleep: she's taking Ambien 15 mg at night to help with sleep needs a refill  Preventive Screening-Counseling & Management  Alcohol-Tobacco     Alcohol drinks/day: 0     Smoking Status: never  Allergies: 1)  ! Dexamethasone (Dexamethasone)  Past History:  Past medical, surgical, family and social histories (including risk factors) reviewed, and no changes noted (except as noted below).  Past Medical History: GERD fibroid tumor      Iron deficiency  familial tremor Hx of low Vitamin D  Past Surgical History: Cholecystectomy Oophorectomy salpingectomy  left  HysterectomyNov 23 2011 lysis of adhesions right  Family History: Reviewed history from 11/16/2007 and no changes required. Family History of Alcoholism/Addiction bro Family History of Arthritis Family History Breast cancer 1st degree relative <50 Family History Diabetes 1st degree relative Family History High cholesterol  Family History Hypertension Family History Ovarian cancer Family History of Sudden Death Family History Uterine cancer  no fam hx other mental illness  Social History: Reviewed history from 06/24/2009 and no changes required. Occupation: Single Never Smoked Alcohol use-yes Drug use-no Regular exercise-yes HH of  1   3 [employer]   Review of Systems  The patient denies fever, vision loss, decreased hearing, chest pain, syncope, dyspnea on exertion, peripheral edema, prolonged cough, headaches, melena, hematochezia, severe indigestion/heartburn, hematuria, transient blindness, difficulty  walking, unusual weight change, enlarged lymph nodes, and angioedema.         end  urinary  pain on right.  side.   Physical Exam  General:  Well-developed,well-nourished,in no acute distress; alert,appropriate and cooperative throughout examination Head:  normocephalic and atraumatic.   Eyes:  vision grossly intact, pupils equal, and pupils round.   Ears:  R ear normal and L ear normal.   Neck:  No deformities, masses, or tenderness noted. Lungs:  Normal respiratory effort, chest expands symmetrically. Lungs are clear to auscultation, no crackles or wheezes. Heart:  Normal rate and regular rhythm. S1 and S2 normal without gallop, murmur, click, rub or other extra sounds. Abdomen:  no hepatomegaly and no splenomegaly.  five laparoscopic incisions healing without mass. There is some bruising her right lower quadrant in nearby some tenderness but no mass effect range of motion of right leg seems stable. No flank pain no guarding or rebound Msk:  no joint swelling, no joint warmth, and no redness over joints.   no atrophy o grip seems 45/5 and symmetrical Pulses:  pulses intact without delay   Extremities:  no clubbing cyanosis or edema  new chords redness or palpable veinsin lower extremity Neurologic:  alert & oriented X3 and gait normal.  but slow Skin:  turgor normal, color normal, no ecchymoses, and no petechiae.   Cervical Nodes:  No lymphadenopathy noted Inguinal Nodes:  No significant adenopathy Psych:  Oriented X3, normally interactive, good eye contact, and not depressed appearing.     Impression & Recommendations:  Problem # 1:  TINGLING (ICD-782.0)  in post op period .     but onset a week later ? cause    atypical presentation.     could consider metabolic disturbance perhaps this is really a medication affecting carpal tunnel but it is hard to make a one diagnosis for all of her symptoms.  Orders: TLB-CBC Platelet - w/Differential (85025-CBCD) TLB-BMP (Basic Metabolic  Panel-BMET) (80048-METABOL) TLB-TSH (Thyroid Stimulating Hormone) (84443-TSH) TLB-Hepatic/Liver Function Pnl (80076-HEPATIC) TLB-Magnesium (Mg) (83735-MG) UA Dipstick w/o Micro (automated)  (81003) Specimen Handling (04540) Venipuncture (98119)  Problem # 2:  LEG PAIN, RIGHT (ICD-729.5) this does not seem like a DVT but because she is post operative will check a Doppler test. This is the same side that her abd groin pain is on and unclear if related  Problem # 3:  ABDOMINAL PAIN RIGHT LOWER QUADRANT (ICD-789.03) right postop apparently being evaluated by her gynecologist  rule out urinary tract infection or other. no systemic symptoms ?  Problem # 4:  ACQUIRED ABSENCE OF BOTH CERVIX AND UTERUS (ICD-V88.01) 10 days post operative  Problem # 5:  SLEEPLESSNESS (ICD-780.52)  Her updated medication list for this problem includes:    Ambien 10 Mg Tabs (Zolpidem tartrate) .Marland Kitchen... 1 to 1 and 1/2  by mouth at bedtime as needed sleep    Silenor 6 Mg Tabs (Doxepin hcl) .Marland Kitchen... 1 by mouth hs for sleep  Complete Medication List: 1)  Omeprazole 20 Mg Cpdr (Omeprazole) .... One by mouth once daily 2)  Ambien 10 Mg Tabs (Zolpidem tartrate) .Marland Kitchen.. 1 to 1 and 1/2  by mouth at bedtime as needed sleep 3)  Ferrous Sulfate 325 (65 Fe) Mg Tabs (Ferrous sulfate) .Marland Kitchen.. 1 by mouth two times a day 4)  Silenor 6 Mg Tabs (Doxepin hcl) .Marland Kitchen.. 1 by mouth hs for sleep  Other Orders: Radiology Referral (Radiology) T-Culture, Urine ([id number]) okay to refill her Ambien  Patient Instructions: 1)  You will be informed of lab results when available.  2)  make sure getting fluids and not dehydrated  3)  doppler today  will let you know results . 4)  will make follow up depending on results  5)  call if fever or  worsening   as needed . Prescriptions: AMBIEN 10 MG TABS (ZOLPIDEM TARTRATE) 1 to 1 and 1/2  by mouth at bedtime as needed sleep  #45 x 1   Entered and Authorized by:   Madelin Headings MD   Signed by:   Madelin Headings MD on 10/03/2010   Method used:   Print then Give to Patient   RxID:   [id number]    Orders Added: 1)  TLB-CBC Platelet - w/Differential [85025-CBCD] 2)  TLB-BMP (Basic Metabolic Panel-BMET) [80048-METABOL] 3)  TLB-TSH (Thyroid Stimulating Hormone) [84443-TSH] 4)  TLB-Hepatic/Liver Function Pnl [80076-HEPATIC] 5)  TLB-Magnesium (Mg) [83735-MG] 6)  UA Dipstick w/o Micro (automated)  [81003] 7)  Radiology Referral [Radiology] 8)  Specimen Handling [99000] 9)  Venipuncture [40347] 10)  T-Culture, Urine [42595-63875] 11)  Est. Patient Level V [64332]    Laboratory Results   Urine Tests    Routine Urinalysis   Color: yellow Appearance: Clear Glucose: negative   (Normal Range: Negative) Bilirubin: negative   (Normal Range: Negative) Ketone: negative   (Normal Range: Negative) Spec. Gravity: 1.025   (Normal Range: 1.003-1.035) Blood: 1+   (Normal Range: Negative) pH: 5.5   (Normal Range: 5.0-8.0) Protein: 1+   (Normal Range: Negative) Urobilinogen: 0.2   (Normal Range: 0-1) Nitrite: negative   (Normal Range: Negative) Leukocyte Esterace: 1+   (Normal Range: Negative)    Comments: Rita Ohara  October 03, 2010 11:17 AM    urine was  recollected and sent for C&S prolonged visit  aquisition of info and coordination of care. 45 minutes .

## 2010-12-02 NOTE — Progress Notes (Signed)
Summary: vit D  Phone Note Call from Patient Call back at [phone]   Summary of Call: Vit D labs Jan and what to do if anything.  Took the 12 weeks Vit D before the lab.  [facility].   Initial call taken by: Rudy Jew, RN,  January 07, 2010 2:25 PM  Follow-up for Phone Call        Continue another 12 weeks  it was   slightly low .   then change over to 1000 international units per day.    check a level in 4-6 months  Sorry about the delay   in reporting Follow-up by: Madelin Headings MD,  January 07, 2010 5:18 PM  Additional Follow-up for Phone Call Additional follow up Details #1::        LMTOCB Additional Follow-up by: Romualdo Bolk, CMA Duncan Dull),  January 08, 2010 8:24 AM    Additional Follow-up for Phone Call Additional follow up Details #2::    Pt aware of results. Follow-up by: Romualdo Bolk, CMA (AAMA),  January 08, 2010 9:17 AM

## 2010-12-02 NOTE — Letter (Signed)
Summary: Generic Letter  [facility]  [address] [place], [place] [address]   Phone: [phone]  Fax: [phone]    06/05/2010  [facility] [address] [place], [place]  [address]  Dear Ms. Metzgar,  We have tried to contact you about your lab results but were unable to get ahold of you. Your labs showed no anemia but low iron. Dr. Fabian Sharp would like you to try a iron supplement each day until you come back for your follow up appointment.  (1) BMP (METABOL)   Sodium                    140 mEq/L                   135-145   Potassium                 4.4 mEq/L                   3.5-5.1   Chloride                  111 mEq/L                   96-112   Carbon Dioxide            26 mEq/L                    19-32   Glucose                   94 mg/dL                    64-33   BUN                       16 mg/dL                    2-95   Creatinine                0.7 mg/dL                   1.8-8.4   Calcium                   8.8 mg/dL                   1.6-60.6   GFR                       99.00 mL/min                >60  Tests: (2) CBC Platelet w/Diff (CBCD)   White Cell Count     [H]  10.7 K/uL                   4.5-10.5   Red Cell Count            4.26 Mil/uL                 3.87-5.11   Hemoglobin                12.5 g/dL                   30.1-60.1   Hematocrit  36.3 %                      36.0-46.0   MCV                       85.3 fl                     78.0-100.0   MCHC                      34.4 g/dL                   56.2-13.0   RDW                  [H]  14.7 %                      11.5-14.6   Platelet Count       [H]  431.0 K/uL                  150.0-400.0   Neutrophil %              64.8 %                      43.0-77.0   Lymphocyte %              22.0 %                      12.0-46.0   Monocyte %                9.7 %                       3.0-12.0   Eosinophils%              2.6 %                       0.0-5.0   Basophils %                0.9 %                       0.0-3.0   Neutrophill Absolute      6.9 K/uL                    1.4-7.7   Lymphocyte Absolute       2.4 K/uL                    0.7-4.0   Monocyte Absolute         1.0 K/uL                    0.1-1.0  Eosinophils, Absolute                             0.3 K/uL                    0.0-0.7   Basophils Absolute        0.1 K/uL                    0.0-0.1  Tests: (3) TSH (TSH)   FastTSH  1.05 uIU/mL                 0.35-5.50  Tests: (4) Hepatic/Liver Function Panel (HEPATIC)   Total Bilirubin      [L]  0.2 mg/dL                   1.6-1.0   Direct Bilirubin          0.1 mg/dL                   9.6-0.4   Alkaline Phosphatase      63 U/L                      39-117   AST                       17 U/L                      0-37   ALT                       31 U/L                      0-35   Total Protein             6.2 g/dL                    5.4-0.9   Albumin                   3.5 g/dL                    8.1-1.9  Tests: (5) Ferritin (FER)   Ferritin             [L]  8.3 ng/mL                   10.0-291.0  Tests: (6) T4, Free (FT4R)   Free T4                   0.95 ng/dL                  0.60-1.60  Tests: (7) T3, Free (T3FREE)   Free T3                   2.7 pg/mL                   2.3-4.2          Sincerely,   Tor Netters, CMA (AAMA)

## 2010-12-02 NOTE — Progress Notes (Signed)
Summary: refill on zolpidem  Phone Note From Pharmacy   [facility]  [phone]* Reason for Call: Needs renewal Details for Reason: zolpidem 10mg  Summary of Call: Last filled on 07/06/10 #30 Initial call taken by: Romualdo Bolk, CMA (AAMA),  August 08, 2010 8:52 AM  Follow-up for Phone Call        ok x 2 Follow-up by: Madelin Headings MD,  August 08, 2010 4:40 PM  Additional Follow-up for Phone Call Additional follow up Details #1::        Rx faxed to pharmacy. Additional Follow-up by: Romualdo Bolk, CMA (AAMA),  August 08, 2010 4:45 PM    Prescriptions: AMBIEN 10 MG TABS (ZOLPIDEM TARTRATE) 1 to 1 and 1/2  by mouth at bedtime as needed sleep  #30 x 1   Entered by:   Romualdo Bolk, CMA (AAMA)   Authorized by:   Madelin Headings MD   Signed by:   Romualdo Bolk, CMA (AAMA) on 08/08/2010   Method used:   Handwritten   RxID:   [id number]

## 2010-12-04 NOTE — Assessment & Plan Note (Signed)
Summary: continued ear pain/dm   Vital Signs:  Patient profile:   45 year old female Menstrual status:  hysterectomy Weight:      186 pounds Pulse rate:   78 / minute BP sitting:   110 / 70  (left arm)  Vitals Entered By: Kyung Rudd, CMA (November 06, 2010 4:05 PM) CC: c/o ear pain not better...feels like sinus drainage    CC:  c/o ear pain not better...feels like sinus drainage .  History of Present Illness: Patient presents to clinic as a workin for evaluation of ear pain. Seen recently 12/29 with dizziness. Please refer to previous note for details.  Took 7d course of augmentin without difficulty without significant improvement. Symptoms becoming more prominent with yellow nasal drainage, teeth pain, bilateral ear pain and frontal HA. No fever/chills. No alleviating or exacerbating factors.  Current Medications (verified): 1)  Omeprazole 20 Mg Cpdr (Omeprazole) .... One By Mouth Once Daily 2)  Ambien 10 Mg Tabs (Zolpidem Tartrate) .Marland Kitchen.. 1 To 1 and 1/2  By Mouth At Bedtime As Needed Sleep 3)  Ferrous Sulfate 325 (65 Fe) Mg Tabs (Ferrous Sulfate) .Marland Kitchen.. 1 By Mouth Two Times A Day  Allergies (verified): 1)  ! Dexamethasone (Dexamethasone)  Social History: Reviewed history from 06/24/2009 and no changes required. Occupation: Single Never Smoked Alcohol use-yes Drug use-no Regular exercise-yes HH of  1   [employer]   Review of Systems      See HPI  Physical Exam  General:  Well-developed,well-nourished,in no acute distress; alert,appropriate and cooperative throughout examination Head:  Normocephalic and atraumatic without obvious abnormalities. No apparent alopecia or balding. +maxillary sinus tenderness. Eyes:  No corneal or conjunctival inflammation noted. EOMI. Perrla. Funduscopic exam benign, without hemorrhages, exudates or papilledema. Vision grossly normal. Ears:  Left ear mild effusion.no external deformities.   Nose:  External nasal examination shows no  deformity or inflammation. Nasal mucosa are pink and moist without lesions or exudates. Lungs:  Normal respiratory effort, chest expands symmetrically. Lungs are clear to auscultation, no crackles or wheezes. Heart:  Normal rate and regular rhythm. S1 and S2 normal without gallop, murmur, click, rub or other extra sounds. Skin:  Intact without suspicious lesions or rashes Cervical Nodes:  No lymphadenopathy noted   Impression & Recommendations:  Problem # 1:  OTHER ACUTE SINUSITIS (ICD-461.8) Assessment Deteriorated Proceed with second abx course for sinusitis. Followup if no improvement or worsening.  Her updated medication list for this problem includes:    Levaquin 500 Mg Tabs (Levofloxacin) ..... One by mouth qd  Complete Medication List: 1)  Omeprazole 20 Mg Cpdr (Omeprazole) .... One by mouth once daily 2)  Ambien 10 Mg Tabs (Zolpidem tartrate) .Marland Kitchen.. 1 to 1 and 1/2  by mouth at bedtime as needed sleep 3)  Ferrous Sulfate 325 (65 Fe) Mg Tabs (Ferrous sulfate) .Marland Kitchen.. 1 by mouth two times a day 4)  Levaquin 500 Mg Tabs (Levofloxacin) .... One by mouth qd Prescriptions: LEVAQUIN 500 MG TABS (LEVOFLOXACIN) one by mouth qd  #10 x 0   Entered and Authorized by:   Edwyna Perfect MD   Signed by:   Edwyna Perfect MD on 11/06/2010   Method used:   [facility]ically to        [facility]  [phone]* (retail)       [address] [place], [place]  [address]       Ph: [phone] or [phone]  Fax: [phone]   RxID:   [id number]    Orders Added: 1)  Est. Patient Level III [95284]

## 2010-12-04 NOTE — Assessment & Plan Note (Signed)
Summary: fatigue/njr   Vital Signs:  Patient profile:   45 year old female Menstrual status:  hysterectomy Temp:     97.8 degrees F oral BP sitting:   120 / 84  (left arm) Cuff size:   regular  Vitals Entered By: Sid Falcon LPN (October 17, 2010 8:38 AM)   History of Present Illness: Patient seen with chief complaint of ongoing fatigue. Similar issues in past. History of chronic insomnia treated with Ambien and generally sleeping well. She complains of being in a mental fog frequently.  She had recent history of hysterectomy with postoperative infection. Infection which has cleared. Had some recent labs including thyroid functions which were normal, CBC significant for thrombocytosis with elevated white blood cell count ( being treated for postoperative infection). Past history low vitamin D. Not currently on Vit D replacement.  No daytime sleepiness. No history sleep apnea. No depressive symptoms. Not exercising regularly. Chronic symptoms of tingling in both hands.  Allergies: 1)  ! Dexamethasone (Dexamethasone)  Past History:  Past Medical History: Last updated: 10/03/2010 GERD fibroid tumor      Iron deficiency  familial tremor Hx of low Vitamin D  Past Surgical History: Last updated: 10/03/2010 Cholecystectomy Oophorectomy salpingectomy  left  HysterectomyNov 23 2011 lysis of adhesions right  Family History: Last updated: 11/16/2007 Family History of Alcoholism/Addiction bro Family History of Arthritis Family History Breast cancer 1st degree relative <50 Family History Diabetes 1st degree relative Family History High cholesterol Family History Hypertension Family History Ovarian cancer Family History of Sudden Death Family History Uterine cancer  no fam hx other mental illness  Social History: Last updated: 06/24/2009 Occupation: Single Never Smoked Alcohol use-yes Drug use-no Regular exercise-yes HH of  1   3 [employer]   Risk  Factors: Alcohol Use: 0 (10/03/2010) Caffeine Use: 0 (09/03/2010) Exercise: yes (09/03/2010)  Risk Factors: Smoking Status: never (10/03/2010) PMH-FH-SH reviewed for relevance  Review of Systems  The patient denies anorexia, fever, hoarseness, chest pain, dyspnea on exertion, peripheral edema, prolonged cough, headaches, hemoptysis, abdominal pain, melena, hematochezia, severe indigestion/heartburn, muscle weakness, suspicious skin lesions, depression, and enlarged lymph nodes.         has lost some weight post operation.  Physical Exam  General:  Well-developed,well-nourished,in no acute distress; alert,appropriate and cooperative throughout examination Head:  normocephalic and atraumatic.   Eyes:  No corneal or conjunctival inflammation noted. EOMI. Perrla. Funduscopic exam benign, without hemorrhages, exudates or papilledema. Vision grossly normal. Ears:  External ear exam shows no significant lesions or deformities.  Otoscopic examination reveals clear canals, tympanic membranes are intact bilaterally without bulging, retraction, inflammation or discharge. Hearing is grossly normal bilaterally. Mouth:  Oral mucosa and oropharynx without lesions or exudates.  Teeth in good repair. Neck:  No deformities, masses, or tenderness noted. Lungs:  Normal respiratory effort, chest expands symmetrically. Lungs are clear to auscultation, no crackles or wheezes. Heart:  Normal rate and regular rhythm. S1 and S2 normal without gallop, murmur, click, rub or other extra sounds. Extremities:  No clubbing, cyanosis, edema, or deformity noted with normal full range of motion of all joints.   Neurologic:  alert & oriented X3, cranial nerves II-XII intact, and strength normal in all extremities.   Skin:  no rashes and no suspicious lesions.   Cervical Nodes:  No lymphadenopathy noted Psych:  normally interactive, good eye contact, not anxious appearing, and not depressed appearing.     Impression &  Recommendations:  Problem # 1:  FATIGUE (ICD-780.79) Assessment Deteriorated  Doubt low B12 (not vegan, no gastric surgery, normal MVC, etc) but pt requests check and reasonable with her unexplained tingling in hands. Repeat CBC.  Recent elev WBC,plts likely sec to surgery and post op infection.  focus on more exercise if labs normal. Orders: TLB-CBC Platelet - w/Differential (85025-CBCD) TLB-B12, Serum-Total ONLY (81191-Y78)  Problem # 2:  VITAMIN D DEFICIENCY (ICD-268.9)  Orders: T-Vitamin D (25-Hydroxy) (29562-13086)  Problem # 3:  TINGLING (ICD-782.0)  Complete Medication List: 1)  Omeprazole 20 Mg Cpdr (Omeprazole) .... One by mouth once daily 2)  Ambien 10 Mg Tabs (Zolpidem tartrate) .Marland Kitchen.. 1 to 1 and 1/2  by mouth at bedtime as needed sleep 3)  Ferrous Sulfate 325 (65 Fe) Mg Tabs (Ferrous sulfate) .Marland Kitchen.. 1 by mouth two times a day 4)  Silenor 6 Mg Tabs (Doxepin hcl) .Marland Kitchen.. 1 by mouth hs for sleep  Patient Instructions: 1)  It is important that you exercise reguarly at least 20 minutes 5 times a week. If you develop chest pain, have severe difficulty breathing, or feel very tired, stop exercising immediately and seek medical attention.  2)  Consider sleep study if labs all unremarkable.   Orders Added: 1)  T-Vitamin D (25-Hydroxy) [phone] 2)  TLB-CBC Platelet - w/Differential [85025-CBCD] 3)  TLB-B12, Serum-Total ONLY [82607-B12] 4)  Est. Patient Level IV [28413]

## 2010-12-04 NOTE — Letter (Signed)
Summary: [facility]  [facility]   Imported By: Maryln Gottron 11/28/2010 13:28:26  _____________________________________________________________________  External Attachment:    Type:   Image     Comment:   External Document

## 2010-12-04 NOTE — Assessment & Plan Note (Signed)
Summary: PRESSURE AND FLUID IN EAR/PT EXTREMELY DIZZY/CJR   Vital Signs:  Patient profile:   45 year old female Menstrual status:  hysterectomy Weight:      187 pounds Temp:     98.2 degrees F Pulse rate:   74 / minute BP sitting:   100 / 68  (left arm)  Vitals Entered By: Kyung Rudd, CMA (October 30, 2010 4:16 PM) CC: dizziness, ??vertigo   CC:  dizziness and ??vertigo.  History of Present Illness: Patient presents to clinic as a workin for evaluation of dizziness. States 2wk h/o dizziness and fatigue. Dizziness mildly worse and fatigue is spontaneously improving. Dizziness better reading with glasses and worse watching action movie as well as with lateral head turning. +right ear fullness with injury or discharge. +teeth pain and sensitivity. No presyncope or syncope. +recent intermittent RUE numbness that is not positional. Denies weakness and has been referred to neurology with appt in near future.   Current Medications (verified): 1)  Omeprazole 20 Mg Cpdr (Omeprazole) .... One By Mouth Once Daily 2)  Ambien 10 Mg Tabs (Zolpidem Tartrate) .Marland Kitchen.. 1 To 1 and 1/2  By Mouth At Bedtime As Needed Sleep 3)  Ferrous Sulfate 325 (65 Fe) Mg Tabs (Ferrous Sulfate) .Marland Kitchen.. 1 By Mouth Two Times A Day  Allergies (verified): 1)  ! Dexamethasone (Dexamethasone)  Past History:  Past medical, surgical, family and social histories (including risk factors) reviewed, and no changes noted (except as noted below).  Past Medical History: Reviewed history from 10/03/2010 and no changes required. GERD fibroid tumor      Iron deficiency  familial tremor Hx of low Vitamin D  Past Surgical History: Reviewed history from 10/03/2010 and no changes required. Cholecystectomy Oophorectomy salpingectomy  left  HysterectomyNov 23 2011 lysis of adhesions right  Family History: Reviewed history from 11/16/2007 and no changes required. Family History of Alcoholism/Addiction bro Family History of  Arthritis Family History Breast cancer 1st degree relative <50 Family History Diabetes 1st degree relative Family History High cholesterol Family History Hypertension Family History Ovarian cancer Family History of Sudden Death Family History Uterine cancer  no fam hx other mental illness  Social History: Reviewed history from 06/24/2009 and no changes required. Occupation: Single Never Smoked Alcohol use-yes Drug use-no Regular exercise-yes HH of  1   3 [employer]   Review of Systems      See HPI General:  Complains of fatigue; denies chills, fever, sweats, and weakness. Eyes:  Denies blurring, discharge, double vision, eye pain, red eye, vision loss-1 eye, and vision loss-both eyes. ENT:  Complains of earache; denies decreased hearing, ear discharge, and ringing in ears. Neuro:  Complains of numbness; denies brief paralysis, disturbances in coordination, falling down, headaches, inability to speak, memory loss, poor balance, seizures, sensation of room spinning, tingling, tremors, visual disturbances, and weakness.  Physical Exam  General:  Well-developed,well-nourished,in no acute distress; alert,appropriate and cooperative throughout examination Head:  Normocephalic and atraumatic without obvious abnormalities. No apparent alopecia or balding. Eyes:  vision grossly intact, pupils equal, pupils round, corneas and lenses clear, and no injection.   Ears:  R tm dull and retracted. No perforation. no canal erythema or discharge. L ear nl Nose:  External nasal examination shows no deformity or inflammation. Nasal mucosa are pink and moist without lesions or exudates. Mouth:  Oral mucosa and oropharynx without lesions or exudates.  Teeth in good repair. Neck:  No deformities, masses, or tenderness noted. Lungs:  Normal respiratory effort, chest expands symmetrically.  Lungs are clear to auscultation, no crackles or wheezes. Heart:  Normal rate and regular rhythm. S1 and S2 normal  without gallop, murmur, click, rub or other extra sounds. Neurologic:  alert & oriented X3, cranial nerves II-XII intact, strength normal in all extremities, sensation intact to light touch, gait normal, and finger-to-nose normal.   Psych:  Oriented X3, memory intact for recent and remote, normally interactive, good eye contact, not anxious appearing, and not depressed appearing.     Impression & Recommendations:  Problem # 1:  DIZZINESS (ICD-780.4) Assessment New Possible contribution from vestibular source With symptoms and ear findings recommend course of abx. Recommend formal eye exam to be scheduled. Keep neurology appt given recent paresthesias. Followup if no improvement or worsening.  Complete Medication List: 1)  Omeprazole 20 Mg Cpdr (Omeprazole) .... One by mouth once daily 2)  Ambien 10 Mg Tabs (Zolpidem tartrate) .Marland Kitchen.. 1 to 1 and 1/2  by mouth at bedtime as needed sleep 3)  Ferrous Sulfate 325 (65 Fe) Mg Tabs (Ferrous sulfate) .Marland Kitchen.. 1 by mouth two times a day 4)  Augmentin 875-125 Mg Tabs (Amoxicillin-pot clavulanate) .... One by mouth bid Prescriptions: AUGMENTIN 875-125 MG TABS (AMOXICILLIN-POT CLAVULANATE) one by mouth bid  #14 x 0   Entered and Authorized by:   Edwyna Perfect MD   Signed by:   Edwyna Perfect MD on 10/30/2010   Method used:   Electronically to        [facility]  [phone]* (retail)       [address] [place], [place]  [address]       Ph: [phone] or [phone]       Fax: [phone]   RxID:   [id number]    Orders Added: 1)  Est. Patient Level IV [34742]

## 2010-12-05 ENCOUNTER — Other Ambulatory Visit: Payer: Self-pay | Admitting: *Deleted

## 2010-12-05 MED ORDER — ZOLPIDEM TARTRATE 10 MG PO TABS: 10.0000 mg | ORAL_TABLET | Freq: Every evening | ORAL | Status: DC | PRN

## 2010-12-05 NOTE — Telephone Encounter (Signed)
Fax this

## 2010-12-08 ENCOUNTER — Ambulatory Visit: Payer: Self-pay | Admitting: Internal Medicine

## 2010-12-08 NOTE — Telephone Encounter (Signed)
Rx faxed to pharmacy  

## 2010-12-31 ENCOUNTER — Ambulatory Visit (INDEPENDENT_AMBULATORY_CARE_PROVIDER_SITE_OTHER): Payer: BC Managed Care – PPO | Admitting: Internal Medicine

## 2010-12-31 ENCOUNTER — Encounter: Payer: Self-pay | Admitting: Internal Medicine

## 2010-12-31 DIAGNOSIS — D509 Iron deficiency anemia, unspecified: Secondary | ICD-10-CM

## 2010-12-31 DIAGNOSIS — G4700 Insomnia, unspecified: Secondary | ICD-10-CM

## 2010-12-31 DIAGNOSIS — R259 Unspecified abnormal involuntary movements: Secondary | ICD-10-CM

## 2010-12-31 DIAGNOSIS — R252 Cramp and spasm: Secondary | ICD-10-CM

## 2010-12-31 DIAGNOSIS — D259 Leiomyoma of uterus, unspecified: Secondary | ICD-10-CM

## 2010-12-31 DIAGNOSIS — E559 Vitamin D deficiency, unspecified: Secondary | ICD-10-CM

## 2010-12-31 NOTE — Patient Instructions (Addendum)
Continue iron bid at least with 250 of vitamin c  Labs in a month and then decide follow up .

## 2011-01-03 ENCOUNTER — Encounter: Payer: Self-pay | Admitting: Internal Medicine

## 2011-01-03 NOTE — Assessment & Plan Note (Signed)
No change 

## 2011-01-03 NOTE — Progress Notes (Signed)
Subjective:    Patient ID: Tiffany Carson, female    DOB: 02/16/1966, 45 y.o.   MRN: [id number]  HPI Patient comesin today as follow up of iron deficincy after having a  complrehensive eval for sx that occurred after her hysterectomy .  She was rx for  Infection but then had weakness feeling and vertigo tingling and aggravation of her tremor.  She under went Neuro and ns and cards eval with neg MRi neck and head and echo and holter .  Also had ENT eval .   Is getting better  And dizziness is no where near as severe.  She has been taking iron and no  Gi bleeding  .   Tremor about the same. Still ambien for sleep.  Taking vit d supplements Past Medical History  Diagnosis Date  . FIBROIDS, UTERUS 09/03/2010  . VITAMIN D DEFICIENCY 06/02/2010  . HYPERLIPIDEMIA 08/08/2007  . OBESITY 06/24/2009  . ANEMIA, IRON DEFICIENCY 07/02/2010  . GERD 08/08/2007  . Irregular menstrual cycle 06/24/2009  . LEG PAIN, RIGHT 10/03/2010  . LEG CRAMPS 06/24/2009  . SLEEPLESSNESS 11/16/2007  . FATIGUE 06/24/2009  . TREMOR 07/02/2010  . TINGLING 10/03/2010  . COUGH, CHRONIC 04/09/2010  . ABDOMINAL PAIN RIGHT LOWER QUADRANT 10/03/2010  . Acquired absence of both cervix and uterus 10/03/2010   Past Surgical History  Procedure Date  . Cholecystectomy   . Oophorectomy     left   . Abdominal hysterectomy nov 2011    secondary infection  had adhesions    reports that she has never smoked. She does not have any smokeless tobacco history on file. She reports that she drinks alcohol. She reports that she does not use illicit drugs. family history is not on file.      Review of Systems No current cp sob fever weight loss  Or nuew numbness no  Petechia or bleeding. No adenopathy.  Mood is good  Doesn't think she is to anxious now,  Had a choking senseation in upper chest  But no cough or sob.     Objective:   Physical Exam Physical Exam: Vital signs reviewed BJY:NWGN is a well-developed well-nourished alert cooperative   white female who appears her stated age in no acute distress.  HEENT: normocephalic  traumatic , Eyes: PERRL EOM's full, conjunctiva clear, Nares: paten,t no deformity discharge or tenderness., Ears: no deformity EAC's clear TMs with normal landmarks. Mouth: clear OP, no lesions, edema.  Moist mucous membranes. Dentition in adequate repair. NECK: supple without masses, thyromegaly or bruits. CHEST/PULM:  Clear to auscultation and percussion breath sounds equal no wheeze , rales or rhonchi. No chest wall deformities or tenderness. CV: PMI is nondisplaced, S1 S2 no gallops, murmurs, rubs. Peripheral pulses are full without delay.No JVD .  ABDOMEN: Bowel sounds normal nontender  No guard or rebound, no hepato splenomegal no CVA tenderness.  No hernia. Extremtities:  No clubbing cyanosis or edema, no acute joint swelling or redness no focal atrophy  No cogwheeling  NEURO:  Oriented x3, cranial nerves 3-12 appear to be intact, no obvious focal weakness,gait within normal limits SKIN: No acute rashes normal turgor, color, no bruising or petechiae. PSYCH: Oriented, good eye contact, no obvious depression anxiety, cognition and judgment appear normal. See  Consult notes in past ehr and  Scanned documents         Assessment & Plan:  Iron deficiency    No more anemia but  Still deficient.  Disc  Plan and goal  to get ferritin to 50 range and then stop   Now that had hyst should be easier to achieve.  omeprazole could also interfere. Don't think the choking episode is of clinical immprot  Just watch how she takes pills.

## 2011-01-03 NOTE — Assessment & Plan Note (Signed)
Still some iron deficiency  And trying to get ferritin to about 50 to see if helps her tremor and poss rl type sx .  Will iincrease oron as tolerated and add vit c

## 2011-01-13 LAB — SURGICAL PCR SCREEN: MRSA, PCR: NEGATIVE

## 2011-01-13 LAB — TYPE AND SCREEN
ABO/RH(D): A POS
Antibody Screen: NEGATIVE

## 2011-01-13 LAB — CBC
HCT: 40.6 % (ref 36.0–46.0)
MCV: 88.5 fL (ref 78.0–100.0)
Platelets: 363 10*3/uL (ref 150–400)
RBC: 4.59 MIL/uL (ref 3.87–5.11)
WBC: 8.8 10*3/uL (ref 4.0–10.5)

## 2011-01-13 LAB — ABO/RH: ABO/RH(D): A POS

## 2011-01-22 ENCOUNTER — Telehealth: Payer: Self-pay | Admitting: Internal Medicine

## 2011-01-22 NOTE — Telephone Encounter (Signed)
What do you want me to to with this?

## 2011-01-22 NOTE — Telephone Encounter (Signed)
LMTCB to schedule appt

## 2011-01-22 NOTE — Telephone Encounter (Signed)
Please triage this message.

## 2011-01-22 NOTE — Telephone Encounter (Signed)
Pt  Called and said that she has cough, chest congestion and tightness in chest. Pt req work in appt with Dr Fabian Sharp after 3pm. Pls advise.

## 2011-01-22 NOTE — Telephone Encounter (Deleted)
Pt has to be created as a new pt due to length of time away from the office.

## 2011-01-23 ENCOUNTER — Telehealth: Payer: Self-pay | Admitting: *Deleted

## 2011-01-23 NOTE — Telephone Encounter (Signed)
Pt had decided not to make appt yet.

## 2011-01-23 NOTE — Telephone Encounter (Signed)
error 

## 2011-01-27 ENCOUNTER — Telehealth: Payer: Self-pay | Admitting: *Deleted

## 2011-01-27 NOTE — Telephone Encounter (Signed)
Refill on zolpidem 10mg  last filled on 12/08/10 #45. Last ov 2/29 and next ov 3/28.

## 2011-01-27 NOTE — Telephone Encounter (Signed)
Will refill at visit tomorrow.

## 2011-01-28 ENCOUNTER — Other Ambulatory Visit (INDEPENDENT_AMBULATORY_CARE_PROVIDER_SITE_OTHER): Payer: BC Managed Care – PPO | Admitting: Internal Medicine

## 2011-01-28 ENCOUNTER — Telehealth: Payer: Self-pay | Admitting: *Deleted

## 2011-01-28 DIAGNOSIS — E611 Iron deficiency: Secondary | ICD-10-CM

## 2011-01-28 LAB — CBC WITH DIFFERENTIAL/PLATELET
Basophils Relative: 0.3 % (ref 0.0–3.0)
Eosinophils Absolute: 0.2 10*3/uL (ref 0.0–0.7)
Eosinophils Relative: 1.2 % (ref 0.0–5.0)
HCT: 39.4 % (ref 36.0–46.0)
Lymphs Abs: 2.4 10*3/uL (ref 0.7–4.0)
MCHC: 34.2 g/dL (ref 30.0–36.0)
MCV: 88.3 fl (ref 78.0–100.0)
Monocytes Absolute: 0.8 10*3/uL (ref 0.1–1.0)
Neutrophils Relative %: 73.2 % (ref 43.0–77.0)
RBC: 4.46 Mil/uL (ref 3.87–5.11)
WBC: 12.6 10*3/uL — ABNORMAL HIGH (ref 4.5–10.5)

## 2011-01-28 LAB — FERRITIN: Ferritin: 64.7 ng/mL (ref 10.0–291.0)

## 2011-01-28 LAB — IBC PANEL: Iron: 91 ug/dL (ref 42–145)

## 2011-01-28 MED ORDER — ZOLPIDEM TARTRATE 10 MG PO TABS: 10.0000 mg | ORAL_TABLET | Freq: Every evening | ORAL | Status: DC | PRN

## 2011-01-28 NOTE — Telephone Encounter (Signed)
rx faxed to pharmacy

## 2011-01-29 ENCOUNTER — Other Ambulatory Visit: Payer: Self-pay | Admitting: Family Medicine

## 2011-01-29 NOTE — Telephone Encounter (Signed)
Routing med request to Dr Estevan Oaks nurse

## 2011-02-05 ENCOUNTER — Telehealth: Payer: Self-pay | Admitting: *Deleted

## 2011-02-05 DIAGNOSIS — Z0000 Encounter for general adult medical examination without abnormal findings: Secondary | ICD-10-CM

## 2011-02-05 DIAGNOSIS — D509 Iron deficiency anemia, unspecified: Secondary | ICD-10-CM

## 2011-02-05 NOTE — Progress Notes (Signed)
Pt aware and will call back to schedule labs in the winter.

## 2011-02-05 NOTE — Telephone Encounter (Signed)
Message copied by Tor Netters on Thu Feb 05, 2011 10:23 AM ------      Message from: [facility] [place] [facility]      Created: Wed Feb 04, 2011  5:33 PM       Tell patients that her ferritin is much better it is 64 currently.      Would maintain at that level .  Perhaps stay on the iron another month or 2 and then stop.            If she is doing well I think we could wait until the winter and do a CPX with full labs and IBC and ferritin diagnosis iron deficiency.

## 2011-02-05 NOTE — Telephone Encounter (Signed)
Future orders placed. Pt aware of results and will call back to schedule her cpx and labs in the winter.

## 2011-02-09 LAB — POCT I-STAT, CHEM 8
Creatinine, Ser: 0.7 mg/dL (ref 0.4–1.2)
Glucose, Bld: 125 mg/dL — ABNORMAL HIGH (ref 70–99)
Hemoglobin: 15 g/dL (ref 12.0–15.0)
TCO2: 26 mmol/L (ref 0–100)

## 2011-02-20 ENCOUNTER — Encounter: Payer: Self-pay | Admitting: Internal Medicine

## 2011-02-20 ENCOUNTER — Ambulatory Visit (INDEPENDENT_AMBULATORY_CARE_PROVIDER_SITE_OTHER): Payer: BC Managed Care – PPO | Admitting: Internal Medicine

## 2011-02-20 VITALS — BP 120/80 | HR 80 | Wt 193.0 lb

## 2011-02-20 DIAGNOSIS — M542 Cervicalgia: Secondary | ICD-10-CM | POA: Insufficient documentation

## 2011-02-20 DIAGNOSIS — M62838 Other muscle spasm: Secondary | ICD-10-CM

## 2011-02-20 DIAGNOSIS — N62 Hypertrophy of breast: Secondary | ICD-10-CM | POA: Insufficient documentation

## 2011-02-20 NOTE — Assessment & Plan Note (Signed)
Recurring problem with spasm.. large breast could be adding to postural discomfort. We'll do physical therapy and referrals

## 2011-02-20 NOTE — Progress Notes (Signed)
  Subjective:    Patient ID: Tiffany Carson, female    DOB: 28-Feb-1966, 45 y.o.   MRN: [id number]  HPI  Patient comes in today for the above reason. She has had some intermittent problem has been going on for quite a while however more recently over the last weeks it is worse. She complains of tightness and discomfort in the back of her neck up to her skull and also in her upper back. She has a lot of tightness up to her right shoulder area. Recurring and worse recently . Denies any fever or trauma although she is more active having recovered from her surgery.  When she was evaluated postoperatively she did have a C-spine MRI which showed minimal degenerative disc. The tremor in her right arm as no different and she has seen neurology for this. She has always had some difficulty withHeavy    Breasts  and maintaining her posture and not leaning forward. Thinks that this may be adding to her discomfort. No radiating pain or weakness.    Has tried massage therapy and chiropractic without significant help and she has been to the headache people in the past.  Interested in looking into breast reduction surgery to help this discomfort.   It is hard for her to wear an underwire bra.  Past history family history social history reviewed in the electronic medical record.   Review of Systems Negative for chest pain shortness of breath currently fever unusual weight loss or bleeding. No change in tremor right had     Objective:   Physical Exam Well-developed well-nourished in no acute distress she does sit somewhat hunched over. Neck negative midline bony tenderness she has a lot of muscle tightness in the trapezius and below the C-spine. Shoulder normal range of motion. Tremors not noted today on the right hand.  No masses are noted. In the neck  Chest:  Clear to A&P without wheezes rales or rhonchi CV:  S1-S2 no gallops or murmurs peripheral perfusion is normal Breast large   pendulous brought  suboptimal support. Axilla is clear. Abdomen without organomegaly.      Assessment & Plan:  Neck and upper back pain Gynecomastia  this certainly could be contributing to her intermittent problem and postural changes. She has had a negative radiologic workup. He gets a recurrent problem despite massage therapy and chiropractic. She's changed her pillow.  tried exercise.   We will do a referral to integrative  therapies for physical therapy and postural training. Meantime we'll plan on plastic surgery consult she will call us about names and will put a referral through.

## 2011-02-20 NOTE — Patient Instructions (Signed)
I agree that breast size  Is adding to you discomfort  We will do a Plastic surgery referral and  PT referral   To integrative.  therapies in the meantime.

## 2011-02-27 ENCOUNTER — Ambulatory Visit: Payer: BC Managed Care – PPO | Admitting: Internal Medicine

## 2011-03-02 ENCOUNTER — Telehealth: Payer: Self-pay | Admitting: *Deleted

## 2011-03-02 MED ORDER — ZOLPIDEM TARTRATE 10 MG PO TABS: 10.0000 mg | ORAL_TABLET | Freq: Every evening | ORAL | Status: DC | PRN

## 2011-03-02 NOTE — Telephone Encounter (Signed)
Per Dr. Panosh- ok x 1. Rx faxed back to pharmacy. 

## 2011-03-02 NOTE — Telephone Encounter (Signed)
Refill on zolpidem 10 mg Last filled on 01/28/11

## 2011-03-16 ENCOUNTER — Other Ambulatory Visit: Payer: Self-pay | Admitting: Internal Medicine

## 2011-03-16 DIAGNOSIS — Z1231 Encounter for screening mammogram for malignant neoplasm of breast: Secondary | ICD-10-CM

## 2011-03-31 ENCOUNTER — Telehealth: Payer: Self-pay | Admitting: *Deleted

## 2011-03-31 NOTE — Telephone Encounter (Signed)
Refill on zolpidem 10mg  last filled on 03/04/11 #45

## 2011-04-01 MED ORDER — ZOLPIDEM TARTRATE 10 MG PO TABS: 10.0000 mg | ORAL_TABLET | Freq: Every evening | ORAL | Status: DC | PRN

## 2011-04-01 NOTE — Telephone Encounter (Signed)
Per Dr. Fabian Sharp- okay x 3. Rx faxed to pharmacy.

## 2011-04-06 ENCOUNTER — Ambulatory Visit: Payer: BC Managed Care – PPO

## 2011-04-10 ENCOUNTER — Ambulatory Visit
Admission: RE | Admit: 2011-04-10 | Discharge: 2011-04-10 | Disposition: A | Discharge status: Home / Self Care | Payer: BC Managed Care – PPO | Source: Ambulatory Visit | Attending: Internal Medicine | Admitting: Internal Medicine

## 2011-04-10 DIAGNOSIS — Z1231 Encounter for screening mammogram for malignant neoplasm of breast: Secondary | ICD-10-CM

## 2011-04-20 ENCOUNTER — Encounter: Payer: Self-pay | Admitting: Internal Medicine

## 2011-04-20 ENCOUNTER — Ambulatory Visit (INDEPENDENT_AMBULATORY_CARE_PROVIDER_SITE_OTHER): Payer: BC Managed Care – PPO | Admitting: Internal Medicine

## 2011-04-20 VITALS — BP 120/80 | HR 66 | Wt 192.0 lb

## 2011-04-20 DIAGNOSIS — E669 Obesity, unspecified: Secondary | ICD-10-CM

## 2011-04-20 DIAGNOSIS — M62838 Other muscle spasm: Secondary | ICD-10-CM

## 2011-04-20 DIAGNOSIS — N62 Hypertrophy of breast: Secondary | ICD-10-CM

## 2011-04-20 DIAGNOSIS — M542 Cervicalgia: Secondary | ICD-10-CM

## 2011-04-20 NOTE — Progress Notes (Signed)
  Subjective:    Patient ID: Tiffany Carson, female    DOB: February 28, 1966, 45 y.o.   MRN: [id number]  HPI Patient comesin today as SDA  For above  Saw  Dr Odis Luster  Earlier this month. Has been undergoing   PT also.  Needs to be losing weight for surgery Still has  Neck an upper body tightness.  Has changes eating and exercise.   Mindfulness  And motivated.  Physician  Directed weight loss is needed.   Joined weight watchers last week.  And changing  Sleep  Using ambien  With help.  Working 40 hours per week doing ok.   Review of Systems Vitamin d and iron and to wean.   knees ache and ankles ache at times.     Past history family history social history reviewed in the electronic medical record.  Objective:   Physical Exam Wt Readings from Last 3 Encounters:  04/20/11 192 lb (87.091 kg)  02/20/11 193 lb (87.544 kg)  12/31/10 199 lb (90.266 kg)   WDWN in nad    Neck tight trapezius  Ok rom  Chest:  Clear to A&P without wheezes rales or rhonchi CV:  S1-S2 no gallops or murmurs peripheral perfusion is normal Abdomen:  Sof,t normal bowel sounds without hepatosplenomegaly, no guarding rebound or masses no CVA tenderness Neuro grossly normal.         Assessment & Plan:  Agree  Gynecomastia  Neck and upper back pain Obesity      Counseled.   Strategies for long term success.    agree  That  Healthy weight loss will help medical problems.  Total visit > 50% spent counseling and coordinating care

## 2011-04-20 NOTE — Patient Instructions (Addendum)
Agree with weight watchers   For weight loss and healthy eating.  Continue   Weight loss. This will continue to help your   Health issues   3500 calories is the energy content of a pound of body weight .Must have a 3500 cal deficit to lose one pound . Thus decrease 500 calorie equivalent per day in food or drink intake / or exercise  for 7 days to lose one pound.

## 2011-04-25 ENCOUNTER — Encounter: Payer: Self-pay | Admitting: Internal Medicine

## 2011-05-04 ENCOUNTER — Encounter: Payer: Self-pay | Admitting: Internal Medicine

## 2011-05-04 ENCOUNTER — Other Ambulatory Visit: Payer: BC Managed Care – PPO

## 2011-05-04 ENCOUNTER — Ambulatory Visit (INDEPENDENT_AMBULATORY_CARE_PROVIDER_SITE_OTHER): Payer: BC Managed Care – PPO | Admitting: Internal Medicine

## 2011-05-04 VITALS — BP 120/80 | HR 78 | Wt 186.0 lb

## 2011-05-04 DIAGNOSIS — M62838 Other muscle spasm: Secondary | ICD-10-CM

## 2011-05-04 DIAGNOSIS — L304 Erythema intertrigo: Secondary | ICD-10-CM

## 2011-05-04 DIAGNOSIS — N62 Hypertrophy of breast: Secondary | ICD-10-CM

## 2011-05-04 MED ORDER — KETOCONAZOLE 2 % EX CREA: TOPICAL_CREAM | Freq: Every day | CUTANEOUS | Status: DC

## 2011-05-04 NOTE — Progress Notes (Signed)
  Subjective:    Patient ID: Tiffany Carson, female    DOB: 1966-07-30, 45 y.o.   MRN: [id number]  HPI Patient comes in today For acute sda for above .Having  Rash under both breasts irritated itching  Off an on for month.  RX tried  :  Keep dry   Baby powder  lamisil and reported no help.       Back and neck pain  still problematic and has done PT .  And compliant with exercises .     Interferes with work  Despite losing weight.  Insurance has denied reduction mammoplasty so far.   Review of Systems Neg fever, injury, cp, sob,  Injury, HA  Past history family history social history reviewed in the electronic medical record.     Objective:   Physical Exam Wt Readings from Last 3 Encounters:  05/04/11 186 lb (84.369 kg)  04/20/11 192 lb (87.091 kg)  02/20/11 193 lb (87.544 kg)  Wd wn in nad  Skin below breast with redness and some satellite lesions.  No vesicles  Or blisters Reviewed Pt reports   Pt etc.      Assessment & Plan:  Intertrigo  Add nizoral and steroid if needed  Keep dry.  Failed otc.  Treatment  Continued neck  Upper back pain related to her gynecomastia  Decrease productivity at work and avoidance acitiviy Despite weight loss of  7 #        physical therapy.   Continue lifestyle intervention healthy eating and exercise .

## 2011-05-04 NOTE — Patient Instructions (Addendum)
Keep dry can use powder to  Keep dry.   Add antifungal and( low dose cortisone)   Expect improvement in the next 2-3 weeks.

## 2011-05-09 DIAGNOSIS — L304 Erythema intertrigo: Secondary | ICD-10-CM | POA: Insufficient documentation

## 2011-05-19 ENCOUNTER — Telehealth: Payer: Self-pay | Admitting: *Deleted

## 2011-05-19 MED ORDER — MICONAZOLE NITRATE 2 % EX CREA: TOPICAL_CREAM | Freq: Two times a day (BID) | CUTANEOUS | Status: DC

## 2011-05-19 NOTE — Telephone Encounter (Signed)
Pt called stating that her rash has not gotten any better on the medication that we gave her. Used Nizoral and hydrocortisone cream.  Per Dr. Fabian Sharp- can try monistat cream  Rx sent to pharmacy and left message for pt to call back.

## 2011-05-19 NOTE — Telephone Encounter (Signed)
Pt aware that rx has been called in

## 2011-05-25 ENCOUNTER — Encounter: Payer: Self-pay | Admitting: Internal Medicine

## 2011-05-25 ENCOUNTER — Ambulatory Visit (INDEPENDENT_AMBULATORY_CARE_PROVIDER_SITE_OTHER): Payer: BC Managed Care – PPO | Admitting: Internal Medicine

## 2011-05-25 ENCOUNTER — Ambulatory Visit: Payer: BC Managed Care – PPO | Admitting: Internal Medicine

## 2011-05-25 VITALS — BP 120/80 | HR 60 | Wt 184.0 lb

## 2011-05-25 DIAGNOSIS — N62 Hypertrophy of breast: Secondary | ICD-10-CM

## 2011-05-25 DIAGNOSIS — L538 Other specified erythematous conditions: Secondary | ICD-10-CM

## 2011-05-25 DIAGNOSIS — M542 Cervicalgia: Secondary | ICD-10-CM

## 2011-05-25 DIAGNOSIS — E669 Obesity, unspecified: Secondary | ICD-10-CM

## 2011-05-25 DIAGNOSIS — L304 Erythema intertrigo: Secondary | ICD-10-CM

## 2011-05-25 NOTE — Patient Instructions (Signed)
Continue lifestyle intervention healthy eating and exercise . return office visit       Or when due for a check up  In about 6 months .

## 2011-05-25 NOTE — Progress Notes (Signed)
  Subjective:    Patient ID: Tiffany Carson, female    DOB: 1966/02/24, 45 y.o.   MRN: [id number]  HPI Has lost a few more pounds  walking 6 days a per week . And dong fitness class,  Also doing weight watchers   Lost 11 pounds   total.    Gm slated to die soon. Back/ neck pain no change.   Coping with this pain and discomfort .  Sleep still interrupted and work  Issues.  To finish  12 weeks of PT July 30 and to reeval  Then      Review of Systems Neg cp sob .    Pain as above.   No sob falling  Bleeding  Rash  Some better but persists  Keeping dry Past history family history social history reviewed in the electronic medical record.      Objective:   Physical Exam Wt Readings from Last 3 Encounters:  05/25/11 184 lb (83.462 kg)  05/04/11 186 lb (84.369 kg)  04/20/11 192 lb (87.091 kg)   WDWN in nad Skin faded pink area  Under breast area no  Vesicles .  Psych normal eye contact.   No depressed or anxieous    Assessment & Plan:  Gynecomastia Overweight Intertrigo.    Better but still present .   Try to keep dry as possible.  Still has s/e of  Gynecomastia.  Neck pain continues   Compliant  On  Multiple interventions   Has lost weight     Still having  Sig pain .  Continue  healthy eating and exercise  for heart health    .  Rec reeval for breast reduction as appropriate per insurance.

## 2011-06-11 ENCOUNTER — Telehealth: Payer: Self-pay

## 2011-06-11 NOTE — Telephone Encounter (Signed)
There is not a "stronger" muscle relaxer than flexeril.  We can try another type.  She should NOT be taking any muscle relaxer and driving. Robaxin 750 mg 1-2 po q 8 hours prn #30 with no refill.

## 2011-06-11 NOTE — Telephone Encounter (Signed)
Pt called and stated she would like a medication called in because she is experiencing lower back spasms.  Pt states she has had this before and feels she does not need to be seen.  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  Pt states flexeril did not work well last time and something stronger is needed.  If prescription is given pls call in to [facility]

## 2011-06-12 NOTE — Telephone Encounter (Signed)
Left message to call back  

## 2011-06-16 NOTE — Telephone Encounter (Signed)
Pt just wants to stay with flexeril. Pt needs a refill on this medication

## 2011-06-16 NOTE — Telephone Encounter (Signed)
Left message to call back  

## 2011-06-17 NOTE — Telephone Encounter (Addendum)
Ok to refill flexeril x 1   If she needs it# 30 and shouldn't drive with this

## 2011-06-18 MED ORDER — CYCLOBENZAPRINE HCL 10 MG PO TABS: 10.0000 mg | ORAL_TABLET | Freq: Three times a day (TID) | ORAL | Status: DC | PRN

## 2011-06-18 NOTE — Telephone Encounter (Signed)
Addended by: Romualdo Bolk on: 06/18/2011 12:54 PM   Modules accepted: Orders

## 2011-06-18 NOTE — Telephone Encounter (Signed)
rx sent to pt.

## 2011-06-30 ENCOUNTER — Telehealth: Payer: Self-pay | Admitting: *Deleted

## 2011-06-30 MED ORDER — ZOLPIDEM TARTRATE 10 MG PO TABS: 10.0000 mg | ORAL_TABLET | Freq: Every evening | ORAL | Status: DC | PRN

## 2011-06-30 NOTE — Telephone Encounter (Signed)
Refill on zolpidem 10mg  last filled on 06/02/11  LOV 05/19/11 NOV 10/12/11

## 2011-06-30 NOTE — Telephone Encounter (Signed)
Per Dr. Fabian Sharp- Ok x 2 Rx sent to pharmacy.

## 2011-07-04 HISTORY — PX: REDUCTION MAMMAPLASTY: SUR839

## 2011-07-23 ENCOUNTER — Other Ambulatory Visit: Payer: Self-pay | Admitting: Plastic Surgery

## 2011-07-23 HISTORY — PX: BREAST REDUCTION SURGERY: SHX8

## 2011-08-25 ENCOUNTER — Other Ambulatory Visit: Payer: Self-pay

## 2011-08-25 NOTE — Telephone Encounter (Signed)
rx request for zolpidem tartrate 10 mg. Pls advise. 

## 2011-08-25 NOTE — Telephone Encounter (Signed)
Ok to refill x 1    #45    

## 2011-08-26 ENCOUNTER — Ambulatory Visit: Payer: BC Managed Care – PPO | Admitting: Internal Medicine

## 2011-08-26 MED ORDER — ZOLPIDEM TARTRATE 10 MG PO TABS: ORAL_TABLET | ORAL | Status: DC

## 2011-08-26 NOTE — Telephone Encounter (Signed)
rx called into pharmacy

## 2011-10-05 ENCOUNTER — Other Ambulatory Visit: Payer: Self-pay | Admitting: *Deleted

## 2011-10-05 ENCOUNTER — Other Ambulatory Visit (INDEPENDENT_AMBULATORY_CARE_PROVIDER_SITE_OTHER): Payer: BC Managed Care – PPO

## 2011-10-05 DIAGNOSIS — D509 Iron deficiency anemia, unspecified: Secondary | ICD-10-CM

## 2011-10-05 DIAGNOSIS — Z0000 Encounter for general adult medical examination without abnormal findings: Secondary | ICD-10-CM

## 2011-10-05 LAB — BASIC METABOLIC PANEL
Calcium: 9 mg/dL (ref 8.4–10.5)
GFR: 93.64 mL/min (ref >60.00)
Potassium: 4.3 mEq/L (ref 3.5–5.1)
Sodium: 143 mEq/L (ref 135–145)

## 2011-10-05 LAB — CBC WITH DIFFERENTIAL/PLATELET
Basophils Relative: 0.6 % (ref 0.0–3.0)
Eosinophils Absolute: 0.4 10*3/uL (ref 0.0–0.7)
HCT: 44.6 % (ref 36.0–46.0)
Hemoglobin: 15.1 g/dL — ABNORMAL HIGH (ref 12.0–15.0)
Lymphocytes Relative: 31.3 % (ref 12.0–46.0)
Lymphs Abs: 2.2 10*3/uL (ref 0.7–4.0)
MCHC: 33.8 g/dL (ref 30.0–36.0)
MCV: 92.7 fl (ref 78.0–100.0)
Monocytes Absolute: 0.6 10*3/uL (ref 0.1–1.0)
Neutro Abs: 3.8 10*3/uL (ref 1.4–7.7)
RBC: 4.81 Mil/uL (ref 3.87–5.11)
RDW: 13.2 % (ref 11.5–14.6)

## 2011-10-05 LAB — POCT URINALYSIS DIPSTICK
Glucose, UA: NEGATIVE
Ketones, UA: NEGATIVE
Leukocytes, UA: NEGATIVE
Protein, UA: NEGATIVE

## 2011-10-05 LAB — LDL CHOLESTEROL, DIRECT: Direct LDL: 158.9 mg/dL

## 2011-10-05 LAB — IBC PANEL
Iron: 78 ug/dL (ref 42–145)
Saturation Ratios: 19.3 % — ABNORMAL LOW (ref 20.0–50.0)

## 2011-10-05 LAB — HEPATIC FUNCTION PANEL
AST: 20 U/L (ref 0–37)
Alkaline Phosphatase: 63 U/L (ref 39–117)
Bilirubin, Direct: 0 mg/dL (ref 0.0–0.3)

## 2011-10-05 LAB — FERRITIN: Ferritin: 25.9 ng/mL (ref 10.0–291.0)

## 2011-10-05 LAB — LIPID PANEL: HDL: 57.8 mg/dL (ref >39.00)

## 2011-10-05 MED ORDER — OMEPRAZOLE 20 MG PO CPDR: 20.0000 mg | DELAYED_RELEASE_CAPSULE | Freq: Every day | ORAL | Status: DC

## 2011-10-05 NOTE — Telephone Encounter (Signed)
Refill on omeprazole

## 2011-10-05 NOTE — Progress Notes (Signed)
Addended by: Bonnye Fava on: 10/05/2011 10:17 AM   Modules accepted: Orders

## 2011-10-06 LAB — VITAMIN D 25 HYDROXY (VIT D DEFICIENCY, FRACTURES): Vit D, 25-Hydroxy: 47 ng/mL (ref 30–89)

## 2011-10-12 ENCOUNTER — Encounter: Payer: Self-pay | Admitting: Internal Medicine

## 2011-10-12 ENCOUNTER — Ambulatory Visit (INDEPENDENT_AMBULATORY_CARE_PROVIDER_SITE_OTHER): Payer: BC Managed Care – PPO | Admitting: Internal Medicine

## 2011-10-12 VITALS — BP 120/80 | HR 60 | Ht 62.0 in | Wt 178.0 lb

## 2011-10-12 DIAGNOSIS — N62 Hypertrophy of breast: Secondary | ICD-10-CM

## 2011-10-12 DIAGNOSIS — Z0000 Encounter for general adult medical examination without abnormal findings: Secondary | ICD-10-CM

## 2011-10-12 DIAGNOSIS — E785 Hyperlipidemia, unspecified: Secondary | ICD-10-CM

## 2011-10-12 DIAGNOSIS — D509 Iron deficiency anemia, unspecified: Secondary | ICD-10-CM

## 2011-10-12 DIAGNOSIS — G4700 Insomnia, unspecified: Secondary | ICD-10-CM

## 2011-10-12 DIAGNOSIS — K219 Gastro-esophageal reflux disease without esophagitis: Secondary | ICD-10-CM

## 2011-10-12 DIAGNOSIS — E669 Obesity, unspecified: Secondary | ICD-10-CM

## 2011-10-12 DIAGNOSIS — R252 Cramp and spasm: Secondary | ICD-10-CM

## 2011-10-12 DIAGNOSIS — Z23 Encounter for immunization: Secondary | ICD-10-CM

## 2011-10-12 MED ORDER — CYCLOBENZAPRINE HCL 10 MG PO TABS: 10.0000 mg | ORAL_TABLET | Freq: Three times a day (TID) | ORAL | Status: DC | PRN

## 2011-10-12 MED ORDER — ZOLPIDEM TARTRATE 10 MG PO TABS: ORAL_TABLET | ORAL | Status: DC

## 2011-10-12 NOTE — Patient Instructions (Signed)
Will arrange   For consult about the sleep and poss RLS. Continue iron.  1-2 x per day.  Continue lifestyle intervention healthy eating and exercise .

## 2011-10-12 NOTE — Assessment & Plan Note (Signed)
Had surgery 9 12  Doing better

## 2011-10-12 NOTE — Progress Notes (Signed)
Subjective:    Patient ID: Tiffany Carson, female    DOB: 08-Mar-1966, 45 y.o.   MRN: [id number]  HPI Patient comes in today for preventive visit and follow-up of medical issues. Update of her history since her last visit. Had the Breast reduction helped sx right away still has some and does stretching   To help this.   As needed muscle relaxant   None.   But will refill not taking nasid currently but was post op.  Sleep see below.   Only taking prilosec prn. Review of Systems ROS:  GEN/ HEENTNo fever, significant weight changes sweats headaches vision problems hearing changes, CV/ PULM; No chest pain shortness of breath cough, syncope,edema  change in exercise tolerance. GI /GU: No adominal pain, vomiting, change in bowel habits. No blood in the stool. No significant GU symptoms. SKIN/HEME: ,no acute skin rashes suspicious lesions or bleeding. No lymphadenopathy, nodules, masses.  Tired and achy at timtes.  NEURO/ PSYCH:  No neurologic signs such as weakness numbness No depression anxiety. IMM/ Allergy: No unusual infections.  Allergy .   REST of 12 system review negative  Past Medical History  Diagnosis Date  . FIBROIDS, UTERUS 09/03/2010  . VITAMIN D DEFICIENCY 06/02/2010  . HYPERLIPIDEMIA 08/08/2007  . OBESITY 06/24/2009  . ANEMIA, IRON DEFICIENCY 07/02/2010  . GERD 08/08/2007  . Irregular menstrual cycle 06/24/2009  . LEG PAIN, RIGHT 10/03/2010  . LEG CRAMPS 06/24/2009  . SLEEPLESSNESS 11/16/2007  . FATIGUE 06/24/2009  . TREMOR 07/02/2010  . TINGLING 10/03/2010  . COUGH, CHRONIC 04/09/2010  . ABDOMINAL PAIN RIGHT LOWER QUADRANT 10/03/2010  . Acquired absence of both cervix and uterus 10/03/2010    History   Social History  . Marital Status: Single    Spouse Name: N/A    Number of Children: N/A  . Years of Education: N/A   Occupational History  . Not on file.   Social History Main Topics  . Smoking status: Never Smoker   . Smokeless tobacco: Not on file  . Alcohol Use: Yes       socially  . Drug Use: No  . Sexually Active: Not on file   Other Topics Concern  . 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     Past Surgical History  Procedure Date  . Cholecystectomy   . Oophorectomy     left   salpingesctomy   . Abdominal hysterectomy nov 2011    secondary infection  had adhesions  . Breast reduction surgery 09 20 12    Family History  Problem Relation Age of Onset  . Alcohol abuse Brother   . Arthritis    . Hypertension      Allergies  Allergen Reactions  . Dexamethasone     Current Outpatient Prescriptions on File Prior to Visit  Medication Sig Dispense Refill  . ferrous sulfate 325 (65 FE) MG tablet Take 325 mg by mouth 2 (two) times daily.        Marland Kitchen ibuprofen (ADVIL,MOTRIN) 200 MG tablet Take 200 mg by mouth every 6 (six) hours as needed.        Marland Kitchen omeprazole (PRILOSEC) 20 MG capsule Take 1 capsule (20 mg total) by mouth daily.  30 capsule  3  . Vitamin D, Cholecalciferol, 400 UNITS CHEW Chew by mouth.  BP 120/80  Pulse 60  Ht 5\' 2"  (1.575 m)  Wt 178 lb (80.74 kg)  BMI 32.56 kg/m2        Objective:   Physical Exam  Wt Readings from Last 3 Encounters:  10/12/11 178 lb (80.74 kg)  05/25/11 184 lb (83.462 kg)  05/04/11 186 lb (84.369 kg)    Physical Exam: Vital signs reviewed [id number] is a well-developed well-nourished alert cooperative  white female who appears her stated age in no acute distress.  HEENT: normocephalic  traumatic , Eyes: PERRL EOM's full, conjunctiva clear, Nares: paten,t no deformity discharge or tenderness., Ears: no deformity EAC's clear TMs with normal landmarks. Mouth: clear OP, no lesions, edema.  Moist mucous membranes. Dentition in adequate repair. NECK: supple without masses, thyromegaly or bruits. CHEST/PULM:  Clear to auscultation and percussion breath sounds equal no wheeze ,  rales or rhonchi. No chest wall deformities or tenderness. CV: PMI is nondisplaced, S1 S2 no gallops, murmurs, rubs. Peripheral pulses are full without delay.No JVD .  BREAST:   Surgical scars well healed and keloid formation.  nontender no masses  LN: no cervical axillary inguinal adenopathy  ABDOMEN: Bowel sounds normal nontender  No guard or rebound, no hepato splenomegal no CVA tenderness.  No hernia. Extremtities:  No clubbing cyanosis or edema, no acute joint swelling or redness no focal atrophy NEURO:  Oriented x3, cranial nerves 3-12 appear to be intact, no obvious focal weakness,gait within normal limits no abnormal reflexes or asymmetrical no tremor seen  SKIN: No acute rashes normal turgor, color, no bruising or petechiae. Keloids around some of the breast scarring    PSYCH: Oriented, good eye contact, no obvious depression anxiety, cognition and judgment appear normal. Lab Results  Component Value Date   WBC 6.9 10/05/2011   HGB 15.1* 10/05/2011   HCT 44.6 10/05/2011   PLT 380.0 10/05/2011   GLUCOSE 117* 10/05/2011   CHOL 233* 10/05/2011   TRIG 121.0 10/05/2011   HDL 57.80 10/05/2011   LDLDIRECT 158.9 10/05/2011   ALT 19 10/05/2011   AST 20 10/05/2011   NA 143 10/05/2011   K 4.3 10/05/2011   CL 106 10/05/2011   CREATININE 0.7 10/05/2011   BUN 14 10/05/2011   CO2 28 10/05/2011   TSH 1.86 10/05/2011   HGBA1C 5.9 06/24/2009   IBC  Slightly low       Assessment & Plan:  Preventive Health Care Counseled regarding healthy nutrition, exercise, sleep, injury prevention, calcium vit d and healthy weight . tdap today  Back pain better with reduction but some continues only using prn meds  Sleep  Increasing again   Iron low again but not a lot.  Has leg sx unsure what the sleep problem is  But now on ambien nightly .  Iron level  drops some  And seems to get worse but not anemic and not bleeding  related   To RL acitivity worse and got better  With iron supplementation .   Get consult .    Hyperglycemia Continue lifestyle intervention healthy eating and exercise . LIPIDS:    No sig change could be better

## 2011-11-08 ENCOUNTER — Other Ambulatory Visit: Payer: Self-pay | Admitting: Internal Medicine

## 2011-11-09 ENCOUNTER — Telehealth: Payer: Self-pay | Admitting: Family Medicine

## 2011-11-09 DIAGNOSIS — E559 Vitamin D deficiency, unspecified: Secondary | ICD-10-CM

## 2011-11-09 DIAGNOSIS — D509 Iron deficiency anemia, unspecified: Secondary | ICD-10-CM

## 2011-11-09 NOTE — Telephone Encounter (Signed)
Pt had cpx in Dec. And was experiencing fatigue and joint pain. It was discussed that it was low iron. However, even with supplements, symptoms persist. Wondering about Vit D deficiency? Please call pt - she wants to know if she needs appt or a lab draw for Vit D. Thanks!

## 2011-11-09 NOTE — Telephone Encounter (Signed)
Spoke to pt- she is having fatigue and joint pain. She is trying to get her iron up. She is more concerned about her fatigue. She is wondering if she could get her vitamin d checked? She is taking 1000-2000 iu's a day.

## 2011-11-09 NOTE — Telephone Encounter (Signed)
Per Dr. Panosh- ok x 1 

## 2011-11-10 NOTE — Telephone Encounter (Signed)
DID she get to see the sleep specialist yet as noted in my last note.?  Her last  Vit d level was ok  Can repeat  Cbc, ibc panel, vit d and  HG a1c  And then plan office visit . ( do not use an SDA  Please)

## 2011-11-11 NOTE — Telephone Encounter (Signed)
Sleep study is scheduled for the end of this month. Pt aware and will call back to schedule labs and a rov

## 2011-11-11 NOTE — Telephone Encounter (Signed)
Left message to call back  

## 2012-01-20 ENCOUNTER — Other Ambulatory Visit: Payer: Self-pay | Admitting: Internal Medicine

## 2012-01-21 NOTE — Telephone Encounter (Signed)
Ok x 1

## 2012-03-10 ENCOUNTER — Other Ambulatory Visit: Payer: Self-pay | Admitting: Internal Medicine

## 2012-03-11 NOTE — Telephone Encounter (Signed)
Ok to refill x 1  

## 2012-03-11 NOTE — Telephone Encounter (Signed)
Pt last sen 10/12/11. Rx last filled 01/20/12 #30.  Pls advise.

## 2012-04-18 ENCOUNTER — Ambulatory Visit: Payer: BC Managed Care – PPO | Admitting: Internal Medicine

## 2012-04-28 ENCOUNTER — Other Ambulatory Visit: Payer: Self-pay | Admitting: Family Medicine

## 2012-04-28 MED ORDER — ZOLPIDEM TARTRATE 10 MG PO TABS: ORAL_TABLET | ORAL | Status: DC

## 2012-05-08 ENCOUNTER — Other Ambulatory Visit: Payer: Self-pay | Admitting: Internal Medicine

## 2012-05-09 ENCOUNTER — Other Ambulatory Visit: Payer: Self-pay | Admitting: Internal Medicine

## 2012-05-09 ENCOUNTER — Ambulatory Visit
Admission: RE | Admit: 2012-05-09 | Discharge: 2012-05-09 | Disposition: A | Discharge status: Home / Self Care | Payer: BC Managed Care – PPO | Source: Ambulatory Visit | Attending: Internal Medicine | Admitting: Internal Medicine

## 2012-05-09 DIAGNOSIS — Z1231 Encounter for screening mammogram for malignant neoplasm of breast: Secondary | ICD-10-CM

## 2012-05-09 NOTE — Telephone Encounter (Signed)
Future appt. 06/27/12.  Last appt. 12/12.  Please advise.

## 2012-05-26 ENCOUNTER — Telehealth: Payer: Self-pay | Admitting: Family Medicine

## 2012-05-26 NOTE — Telephone Encounter (Signed)
Needs refills.  Last seen 04/18/12 (fu) and has future appt on 06/27/12 (fu).  Last refilled on 04/28/12 #45 0 refills.  Please advise.  Thanks!!!

## 2012-05-26 NOTE — Telephone Encounter (Signed)
Refill x1 

## 2012-05-27 ENCOUNTER — Other Ambulatory Visit: Payer: Self-pay | Admitting: Family Medicine

## 2012-05-27 MED ORDER — ZOLPIDEM TARTRATE 10 MG PO TABS: ORAL_TABLET | ORAL | Status: DC

## 2012-05-27 NOTE — Telephone Encounter (Signed)
Medication called to the pharmacy.  Left on voicemail.

## 2012-06-13 ENCOUNTER — Other Ambulatory Visit: Payer: BC Managed Care – PPO

## 2012-06-17 ENCOUNTER — Other Ambulatory Visit: Payer: Self-pay | Admitting: Internal Medicine

## 2012-06-20 ENCOUNTER — Telehealth: Payer: Self-pay | Admitting: Family Medicine

## 2012-06-20 NOTE — Telephone Encounter (Signed)
Last seen 04/18/12 and fu on 06/27/12.  Last filled on 05/08/12 #30 0 refills.  Please advise.  WP patient.

## 2012-06-20 NOTE — Telephone Encounter (Signed)
Telephone note created and sent to Dr. Clent Ridges.  WP on vacation.

## 2012-06-20 NOTE — Telephone Encounter (Signed)
Ask Dr.Panosh tomorrow

## 2012-06-22 ENCOUNTER — Telehealth: Payer: Self-pay | Admitting: Family Medicine

## 2012-06-22 ENCOUNTER — Other Ambulatory Visit: Payer: Self-pay | Admitting: Family Medicine

## 2012-06-22 DIAGNOSIS — Z0000 Encounter for general adult medical examination without abnormal findings: Secondary | ICD-10-CM

## 2012-06-22 NOTE — Telephone Encounter (Signed)
Sent to the pharmacy by e-scribe. 

## 2012-06-22 NOTE — Telephone Encounter (Signed)
Pt returned my call.  I called her back and left message.  Needs CPE scheduled for Dec.

## 2012-06-22 NOTE — Telephone Encounter (Signed)
Left message on voicemail for the pt tor return my call.

## 2012-06-22 NOTE — Telephone Encounter (Signed)
Can refill x 1  Noted last 2 appts were canceled .   Make sure she reschedules appt  ROV .  Before runs out of med

## 2012-06-22 NOTE — Telephone Encounter (Signed)
Pt needs refills.  Last seen last year on 10/12/11 for CPE.  I scheduled her for CPE on 10/31/12 and labs.  Last filled on 05/27/12 #45. Please advise.  [facility] [address]

## 2012-06-23 NOTE — Telephone Encounter (Signed)
Ok to refill x 3 

## 2012-06-24 ENCOUNTER — Other Ambulatory Visit: Payer: Self-pay | Admitting: Internal Medicine

## 2012-06-24 MED ORDER — ZOLPIDEM TARTRATE 10 MG PO TABS: ORAL_TABLET | ORAL | Status: DC

## 2012-06-24 NOTE — Telephone Encounter (Signed)
Called to the pharmacy and left on voicemail. 

## 2012-06-27 ENCOUNTER — Ambulatory Visit: Payer: BC Managed Care – PPO | Admitting: Internal Medicine

## 2012-06-27 ENCOUNTER — Other Ambulatory Visit (INDEPENDENT_AMBULATORY_CARE_PROVIDER_SITE_OTHER): Payer: BC Managed Care – PPO

## 2012-06-27 DIAGNOSIS — Z0000 Encounter for general adult medical examination without abnormal findings: Secondary | ICD-10-CM

## 2012-06-27 DIAGNOSIS — D509 Iron deficiency anemia, unspecified: Secondary | ICD-10-CM

## 2012-06-27 DIAGNOSIS — E559 Vitamin D deficiency, unspecified: Secondary | ICD-10-CM

## 2012-06-27 LAB — BASIC METABOLIC PANEL
GFR: 107.16 mL/min (ref >60.00)
Potassium: 4.8 mEq/L (ref 3.5–5.1)
Sodium: 146 mEq/L — ABNORMAL HIGH (ref 135–145)

## 2012-06-27 LAB — CBC WITH DIFFERENTIAL/PLATELET
Basophils Absolute: 0 10*3/uL (ref 0.0–0.1)
Eosinophils Relative: 3.6 % (ref 0.0–5.0)
Hemoglobin: 15.2 g/dL — ABNORMAL HIGH (ref 12.0–15.0)
Lymphocytes Relative: 27.9 % (ref 12.0–46.0)
Monocytes Relative: 6.7 % (ref 3.0–12.0)
Platelets: 405 10*3/uL — ABNORMAL HIGH (ref 150.0–400.0)
RDW: 12.8 % (ref 11.5–14.6)
WBC: 6.4 10*3/uL (ref 4.5–10.5)

## 2012-06-27 LAB — HEPATIC FUNCTION PANEL
ALT: 17 U/L (ref 0–35)
Albumin: 4.2 g/dL (ref 3.5–5.2)
Total Protein: 7.3 g/dL (ref 6.0–8.3)

## 2012-06-27 LAB — LDL CHOLESTEROL, DIRECT: Direct LDL: 135.4 mg/dL

## 2012-06-27 LAB — LIPID PANEL
Cholesterol: 207 mg/dL — ABNORMAL HIGH (ref 0–200)
HDL: 61 mg/dL (ref >39.00)
Total CHOL/HDL Ratio: 3
VLDL: 15.2 mg/dL (ref 0.0–40.0)

## 2012-06-27 LAB — IBC PANEL: Saturation Ratios: 18.5 % — ABNORMAL LOW (ref 20.0–50.0)

## 2012-06-27 LAB — HEMOGLOBIN A1C: Hgb A1c MFr Bld: 5.7 % (ref 4.6–6.5)

## 2012-06-28 LAB — VITAMIN D 25 HYDROXY (VIT D DEFICIENCY, FRACTURES): Vit D, 25-Hydroxy: 48 ng/mL (ref 30–89)

## 2012-07-08 NOTE — Progress Notes (Signed)
Quick Note:  Left a message for pt to return call. ______ 

## 2012-07-26 ENCOUNTER — Other Ambulatory Visit: Payer: Self-pay | Admitting: Internal Medicine

## 2012-10-05 ENCOUNTER — Other Ambulatory Visit: Payer: Self-pay | Admitting: *Deleted

## 2012-10-05 MED ORDER — ZOLPIDEM TARTRATE 10 MG PO TABS: ORAL_TABLET | ORAL | Status: DC

## 2012-10-24 ENCOUNTER — Other Ambulatory Visit: Payer: BC Managed Care – PPO

## 2012-10-31 ENCOUNTER — Encounter: Payer: BC Managed Care – PPO | Admitting: Internal Medicine

## 2012-11-09 ENCOUNTER — Other Ambulatory Visit: Payer: Self-pay | Admitting: Internal Medicine

## 2012-11-30 ENCOUNTER — Encounter: Payer: Self-pay | Admitting: Internal Medicine

## 2012-11-30 ENCOUNTER — Ambulatory Visit (INDEPENDENT_AMBULATORY_CARE_PROVIDER_SITE_OTHER): Payer: 59 | Admitting: Internal Medicine

## 2012-11-30 VITALS — BP 134/86 | HR 94 | Temp 98.3°F | Wt 188.0 lb

## 2012-11-30 DIAGNOSIS — M25569 Pain in unspecified knee: Secondary | ICD-10-CM | POA: Insufficient documentation

## 2012-11-30 NOTE — Patient Instructions (Signed)
There seem  To be a medial collateral strain pain and some swelling under the knee cap.  Could have arthritis or other  Derangement .  Will arrange a sports medicine evaluation  And advice for rehab.  Exercises.     Knee Problems, Questions and Answers Knee problems are common in young people and adults. This document contains general information about several knee problems. It includes:  Descriptions of the different parts of the knee.  Diagram of the different parts of the knee. Individual sections describe specific types of knee injuries and their:   Symptoms.  Diagnosis.  Treatment. Information on how to prevent these problems is also provided. WHAT DO THE KNEES DO? HOW DO THEY WORK? The knees provide stable support for the body. Knees allow the legs to bend and straighten. Flexibility and stability are needed for standing and for motions like:  Walking.  Jumping.  Running.  Turning.  Crouching. Supporting and moving parts help the knees do their job, these parts include:  Bones.  Cartilage.  Muscles.  Ligaments.  Tendons. Any of these parts can be involved in knee pain or a knee not working right (dysfunction). WHAT CAUSES KNEE PROBLEMS? There are two general kinds of knee problems: mechanical and inflammatory. Mechanical Knee Problems are problems that result from:  Injury, such as a direct blow or sudden movements that strain the knee beyond its normal range of movement.  Overuse, repetitive motions that produce partial fiber failure in tendon or ligaments.  Osteoarthritis in the knee, result from wear and tear on its parts. Inflammatory Knee Problems are inflammation that occurs in certain rheumatic diseases, such as:   Rheumatoid arthritis.  Systemic lupus erythematosus. JOINT BASICS  The point at which two or more bones are connected is called a joint.  In all joints, the bones are kept from grinding against each other by the tissue lining the  ends of the bones called cartilage.  Bones are joined to bones by strong, elastic bands of tissue called ligaments.  Tendons are tough cords of tissue that connect muscle to bone.  Muscles work in opposing pairs to bend and straighten joints. While muscles are not technically part of a joint, they are important because strong muscles help support and protect joints. WHAT ARE THE PARTS OF THE KNEE? Like any joint, the knee is composed of bones and cartilage, ligaments, tendons, and muscles. BONES AND CARTILAGE The knee joint is the junction of four bones:   The thigh bone or upper leg bone (femur).  The shin bone or larger bone of the lower leg (tibia).  The small bone on the outside of the knee where ligaments attach (fibula).  The knee cap (patella). The patella is 2 to 3 inches wide and 3 to 4 inches long. It sits over the other bones at the front of the knee joint and slides when the leg moves. It protects the knee and gives leverage to muscles. The ends of the bones in the knee joint are covered with articular cartilage, a tough, elastic material that helps absorb shock and allows the knee joint to move smoothly. Separating the bones of the knee are pads of connective tissue which are called meniscus. The plural is menisci. The menisci are divided into two crescent-shaped discs positioned between the tibia and femur on the outer and inner sides of each knee. The two menisci in each knee act as shock absorbers, cushioning the lower part of the leg from the weight of the  rest of the body as well as enhancing stability. MUSCLES There are two groups of muscles at the knee.  The quadriceps muscle are four muscles on the front of the thigh that work to straighten the leg from a bent position.  The hamstring muscles, which bend the leg at the knee, run along the back of the thigh from the hip to just below the knee. Keeping these muscles strong with exercises such as walking up stairs or  riding a stationary bicycle helps support and protect the knee. TENDONS AND LIGAMENTS  The quadriceps tendon connects the quadriceps muscle to the patella and provides the power to extend the leg. The patella is a bone within this tendon. Four ligaments connect the femur and tibia and give the joint strength and stability:  The medial collateral ligament (MCL) provides stability to the inner (medial) part of the knee.  The lateral collateral ligament (LCL) provides stability to the outer (lateral) part of the knee.  The anterior cruciate ligament (ACL), in the center of the knee, limits rotation and the forward movement of the tibia.  The posterior cruciate ligament (PCL), also in the center of the knee, limits backward movement of the tibia.  Other ligaments are part of the knee capsule. This is the protective, fiber-like structure that wraps around the knee joint. Inside the capsule, the joint is lined with a thin, soft tissue called synovium. This tissue produces the fluid (synovial fluid) which lubricates the joint. HOW ARE KNEE PROBLEMS DIAGNOSED? Caregivers use several methods to diagnose knee problems:  Medical history--The patient tells the caregiver details about:  Symptoms.  Injuries.  Medical conditions.  Physical examination-- To help the caregiver understand how the knee is working, the patient may be asked to stand, walk or squat. The caregiver, to discover the limits of movement and the location of pain in the knee, may:  Bend the knee.  Straighten the knee.  Rotate (turn) turn the knee.  Press on the knee to feel for injury.  Diagnostic tests--The caregiver uses one or more stress tests to determine the nature of a knee problem.  X-ray (radiography)--An x-ray beam is passed through the knee to produce a two-dimensional picture of the bones.  Computerized axial tomography (CAT) scan--X-rays are passed through the knee at different angles, detected by a scanner,  and analyzed by a computer. This produces a series of clear cross-sectional images ("slices") of the knee tissues on a computer screen. CAT scan images show details of bone structure, show soft tissues such as ligaments or muscles to a limited degree, can give a three-dimensional view of the knee.  Bone scan (radionuclide scanning)--A very small amount of radioactive material is injected into the patient's bloodstream and detected by a scanner. This test detects blood flow to the bone and cell activity within the bone and can show abnormalities. This may help the caregiver understand what is wrong.  Magnetic resonance imaging (MRI)--Energy from a powerful magnet (rather than x-rays) stimulates knee tissue to produce signals. These signals are detected by a scanner and analyzed by a computer. Like a CAT scan, a computer is used to produce three-dimensional views of the knee during MRI. The MRI provides precise details of ligament, tendon and cartilage structure.  Arthroscopy--The surgeon manipulates a small, lighted optic tube (arthroscope) that has been inserted into the joint through a small incision in the knee. Images of the inside of the knee joint are projected onto a television screen. While the arthroscope is inside  the knee joint, removal of loose pieces of bone or cartilage, or the repair of torn ligaments and menisci can be preformed.  Biopsy--The caregiver removes tissue to examine under a microscope.  Aspiration of fluid from the knee--The laboratory will analyze the fluid for cell count, presence of crystals that produce inflammation (as in gout where Uric Acid crystals are the cause of the inflammation) and check for infection. WHAT IS ARTHRITIS OF THE KNEE?   Arthritis of the knee is most often osteoarthritis. In this disease, the cartilage in the joint gradually wears away. It may be caused by excess stress on the joint from:  Trauma.  Deformity.  Repeated injury.  Excess  weight.  It most often affects middle-aged and older people. A young person who develops osteoarthritis may have an inherited form of the disease or may have experienced continuous irritation from an unrepaired knee injury or other injury.  In rheumatoid arthritis, which can also affect the knees, the joint becomes inflamed and cartilage may be destroyed. Rheumatoid arthritis often affects people at an earlier age than osteoarthritis and often involves multiple joints.  Arthritis can also affect supporting structures such as muscles, tendons, and ligaments. WHAT ARE SIGNS OF ARTHRITIS OF THE KNEE?  Someone who has arthritis of the knee may experience:  Pain.  Swelling/ fluid on the knee.  A decrease in knee motion.  A common symptom is morning stiffness. This generally improves as the person moves around.  Sometimes the joint locks or clicks. These signs may occur in other knee disorders as well.  The caregiver may confirm the diagnosis by:  Performing a physical examination.  Examining x-rays, which typically show a loss of joint space.  Blood tests may be helpful for diagnosing rheumatoid arthritis, but other tests may be needed.  Analyzing fluid from the knee joint may be helpful in diagnosing some kinds of arthritis.  The caregiver may use arthroscopy to directly see damage to cartilage, tendons, and ligaments and to confirm a diagnosis. Arthroscopy is usually done only if a repair procedure is to be performed. WHAT IS TREATMENT FOR ARTHRITIS OF THE KNEE?  Most often osteoarthritis of the knee is treated with pain-reducing medicines, such as:  Nonsteroidal anti-inflammatory drugs (NSAID's)  Exercises to restore joint movement and strengthen the knee.  Losing excess weight can also help people with osteoarthritis.  Rheumatoid arthritis of the knee may require physical therapy and more powerful medicines. In people with severe arthritis of the knee, a seriously damaged  joint may need to be replaced with an artificial one. WHAT IS CHONDROMALACIA?  Chondromalacia refers to softening of the articular cartilage of the knee cap. This disorder occurs most often in young adults. Instead of gliding smoothly across the lower end of the thigh bone, the knee cap rubs against it, thereby roughening the cartilage underneath the knee cap. The damage may range from a slightly abnormal surface of the cartilage to a surface that has been worn away to the bone. It can be caused by:  Injury.  Overuse.  Misalignment of the patellar tendon.  Muscle weakness (generally the quadriceps).  Chondromalacia related to injury occurs when a blow to the knee cap tears off either a small piece of cartilage or a large fragment containing a piece of bone. WHAT ARE SYMPTOMS OF CHONDROMALACIA?  The most frequent symptom is a dull pain around or under the knee cap. This pain worsens when walking down stairs, or hills or sitting with the knee bent for  long periods of time.  A person may also feel pain when climbing stairs or when the knee bears weight as it straightens.  The disorder is common in:  Runners.  Skiers.  Cyclists.  Soccer players.  A patient's description of symptoms, the physical exam, and a follow-up x-ray usually help the caregiver make a diagnosis.  Although arthroscopy can confirm the diagnosis. It is not used unless the condition requires extensive treatment. WHAT IS TREATMENT FOR CHONDROMALACIA?  Many caregivers recommend that patients with chondromalacia perform low-impact exercises. The knee must not bend more than 90 degrees. This includes:  Swimming.  Riding a stationary bicycle.  Using a cross-country ski machine.  Electrical stimulation may also be used to strengthen the muscles.  If these treatments do not improve the condition, the caregiver may perform arthroscopic. Goals of this surgery include smoothing the surface of the cartilage and  "washing out" the cartilage fragments that cause the joint to catch during bending and straightening.  In more severe cases, surgery may be necessary to:  Correct the alignment of the knee cap.  Decrease the pressure on the undersurface of the patella.  Relieve friction with the cartilage.  Reposition parts that are out of alignment. WHAT CAUSES INJURIES TO THE MENISCUS? The meniscus is easily injured by the force of rotating the knee while bearing weight. A partial or total tear may occur when a person quickly twists or rotates the upper leg while the foot stays still. For example, when dribbling a basketball around an opponent or turning to hit a tennis ball. If the tear is tiny, the meniscus stays connected to the front and back of the knee. If the tear is large, the meniscus may be left in an abnormally mobile position which produces instability. The seriousness of a tear depends on its location and extent. WHAT ARE SYMPTOMS OF INJURIES TO THE MENISCUS?  Pain, particularly when the knee is straightened.  If the pain is mild, the patient may continue with normal activity.  Severe pain may occur if a fragment of the meniscus catches between the femur and the tibia.  Swelling may occur soon after injury if blood vessels are disrupted. Swelling may occur several hours later if the joint fills with fluid produced by the joint lining (synovium) as a result of inflammation. If the synovium is injured, it may become inflamed and produce fluid. This makes the knee swell.  Sometimes, an injury that occurred in the past but was not treated becomes painful months or years later.  After any injury, the knee may click, lock, feel weak, or give way without warning.  Although symptoms of meniscal injury may disappear on their own (particularly with a stable meniscal tear), they frequently persist or return and require treatment. HOW IS INJURY TO THE MENISCUS DIAGNOSED?  The caregiver will listen to  the patient's description of the pain and swelling. The caregiver will perform a physical examination and take x-rays of the knee. The examination may include a test in which the caregiver bends the leg, and then rotates the leg outward and inward while extending it. Pain along the joint line or an audible click suggests a meniscal tear.  An MRI may be done.  Occasionally, the caregiver may use arthroscopy without obtaining the MRI to diagnose and treat a meniscal tear. WHAT IS TREATMENT FOR INJURY TO THE MENISCUS?  The caregiver may recommend a muscle-strengthening program if:  The tear is minor.  The pain and symptoms are improving.  Exercises for meniscal problems are best started with guidance from a caregiver and physical therapist or athletic trainer. The therapist will make sure that the patient does the exercises properly and without risking new or repeat injury. The following exercises after injury to the meniscus are designed to build up the quadriceps and hamstring muscles and increase flexibility and strength.  Warming up the joint by riding a stationary bicycle, then straightening and raising the leg (but not straightening it too much).  Extending the leg while sitting (a weight may be worn on the ankle for this exercise).  Raising the leg while lying on the stomach.  Exercising in a pool (walking as fast as possible in chest-deep water, performing small flutter kicks while holding onto the side of the pool, and raising each leg to 90 in chest-deep water while pressing the back against the side of the pool).  If the tear is more extensive, the caregiver may perform arthroscopic with or without open surgery to see the extent of injury and to repair the tear. The caregiver can sew the meniscus back in place if the patient is relatively young, if the injury is in an area with a good blood supply, and if the ligaments are intact. Most young athletes are able to return to active  sports after meniscus repair.  If the patient is elderly or the tear is in an area with a poor blood supply, the caregiver may trim a small portion of the meniscus to even the surface. In rare cases, the caregiver removes the entire meniscus. Osteoarthritis is more likely to develop in the knee if the entire meniscus is removed.  Recovery after surgical repair takes several weeks to months. Activity after surgery is slightly more restricted than when the meniscus is partially removed. However, putting weight on the joint actually helps recovery. Regardless of the form of surgery, rehabilitation usually includes:  Walking.  Bending the legs.  Doing exercises that stretch and build up leg muscles.  The best results of treatment for meniscal injury are obtained in people who:  Do not have articular cartilage changes.  Have an intact ACL. LIGAMENT INJURIES WHAT ARE THE CAUSES OF ANTERIOR AND POSTERIOR CRUCIATE LIGAMENT INJURIES?  Injury to the cruciate ligaments is sometimes referred to as a "sprain".  The ACL is most often stretched or torn (or both) by a sudden twisting or pushing the ACL beyond its normal range. For example, when the feet are planted one way and the knee rotates in the opposite direction.  The PCL is most often injured by a direct impact, such as in an automobile accident or football tackle. WHAT ARE SYMPTOMS?  Injury to a cruciate ligament may not cause pain. Symptoms may include:  A popping sound  Buckling when trying to stand on the leg.  The caregiver will perform several physical exam tests. These tests are to see whether the parts of the knee stay in proper position when pressure is applied in different directions.  A thorough examination is essential. An MRI is very accurate in detecting a complete tear. Arthroscopy may be the only reliable means of detecting a partial one. TREATMENT  For an incomplete tear, the caregiver may recommend that the patient  begin an exercise program to strengthen surrounding muscles.  The caregiver may also prescribe a brace to protect the knee during activity.  For a completely torn ACL in an active athlete and motivated person, the caregiver is likely to recommend surgery. The surgeon [phone]  reconstruct the torn ligament by using:  A piece (graft) of healthy ligament from the patient (autograft)  A piece of ligament from a tissue bank (allograft). One of the most important elements in a patient's successful recovery after cruciate ligament surgery is a 4- to 39-month exercise program. This program may involve using special exercise equipment at a rehabilitation or sports center. Successful surgery special exercises will allow the patient to return to a normal, active lifestyle. MEDIAL AND LATERAL COLLATERAL LIGAMENT INJURIES WHAT IS THE MOST COMMON CAUSE OF MEDIAL AND LATERAL COLLATERAL LIGAMENT INJURIES? The MCL is more commonly injured than the LCL. The cause is most often a blow to the outer side of the knee. This injury stretches and tears the ligament on the inner side of the knee. Such blows frequently occur in contact sports like football or hockey. SYMPTOMS AND DIAGNOSIS  When injury to the MCL occurs, a person may feel a pop and the knee may buckle sideways.  Pain and swelling are common.  A thorough exam is needed to determine the kind and extent of the injury.  To diagnose a collateral ligament injury, the caregiver exerts pressure on the side of the knee to determine the degree of pain and the looseness of the joint.  An MRI is helpful in diagnosing injuries to these ligaments. TREATMENT  Most sprains of the collateral ligaments will heal if the patient follows a prescribed exercise program.  In addition to exercise, the caregiver may recommend ice packs to reduce pain and swelling and a small sleeve-type brace to protect and stabilize the knee.  A sprain may take 4 to 6 weeks to heal.  A  patient with a severely sprained or torn collateral ligament may also have a torn ACL. This usually requires surgical repair. TENDON INJURIES AND DISORDERS WHAT CAUSES TENDINITIS AND RUPTURED TENDONS?  Knee tendon injuries range from tendinitis to a torn (ruptured) tendon.  If a person overuses a tendon during certain activities such as dancing, cycling, or running, the tendon stretches like a worn-out rubber band and becomes inflamed.  Also, trying to break a fall may cause the quadriceps muscles to contract and tear the quadriceps tendon above the patella or the patellar tendon below the patella. This type of injury is most likely to happen in older people.  Tendinitis of the patellar tendon is sometimes called jumper's knee because in sports that require jumping, such as basketball or volleyball, the muscle contraction and force of hitting the ground after a jump strain the tendon.  After repeated stress, the tendon may become inflamed or tear. SYMPTOMS AND DIAGNOSIS  People with tendinitis often have tenderness at the point where the patellar tendon meets the bone. In addition, they may feel pain during running, fast walking, or jumping.  A complete rupture of the quadriceps or patellar tendon is painful. It also makes it difficult for a person to bend, extend, or lift the leg against gravity.  If there is not much swelling, the caregiver may be able to feel a defect in the tendon near the tear during a physical examination.  An x-ray will show that the patella is lower than normal in a quadriceps tendon tear and higher than normal in a patellar tendon tear. The caregiver may use an MRI to confirm a partial or total tear. TREATMENT  Initially, the caregiver may ask a patient with tendinitis to rest, elevate, and apply ice to the knee and to take medicines to relieve pain and decrease  inflammation and swelling.  If the quadriceps or patellar tendon is completely ruptured, a surgeon will  reattach the ends. After surgery, the patient will wear a cast or brace for 3 to 6 weeks and use crutches.  For a partial tear, the caregiver might apply a cast or an extension knee brace without performing surgery.  Rehabilitating a partial or complete tear of a tendon requires an exercise program that is similar to but less forceful than that used for ligament injuries. The goals of exercise are to restore the ability to bend and straighten the knee and to strengthen the leg to prevent repeat injury. A rehabilitation program may last 4 to 6 months. A patient can return to many activities before then. OSGOOD-SCHLATTER DISEASE WHAT CAUSES OSGOOD-SCHLATTER DISEASE?  Osgood-Schlatter disease is caused by repetitive stress or tension on part of the growth area of the upper tibia (the apophysis). Symptoms included inflammation of the patellar tendon and surrounding soft tissues at the point where the tendon attaches to the tibia.  The disease may also be associated with an injury in which the tendon is stretched so much that it tears away from the tibia and takes a fragment of bone with it.  The disease generally affects active young people. Particularly boys between the ages of 60 and 82, who play games or sports that include frequent running and jumping and who have open growth plates. SYMPTOMS AND DIAGNOSIS  People with this disease experience pain just below the knee joint. This pain usually worsens with activity and is relieved by rest.  The bony bump that is particularly painful when pressed may increase in size at the upper edge of the tibia (below the knee cap).  Usually motion of the knee is not affected.  Pain may last a few months and may come back with periods of high activity until the child's growth is completed.  Osgood-Schlatter disease is most often diagnosed by the symptoms and the physical exam. An x-ray may be normal, or show an injury. An x-ray, more typically, will show that  the growth area is fragmented. TREATMENT  Usually, the disease goes away without aggressive treatment.  Applying ice to the knee when pain begins helps relieve inflammation. Applying ice is sometimes used along with stretching and strengthening exercises.  The caregiver may advise the patient to limit participation in vigorous sports. Children who wish to continue less stressful sports activities may need to wear knee pads for protection and apply ice to the knee after activity. If there is a great deal of pain, sports activities may be limited until discomfort becomes tolerable. ILIOTIBIAL BAND SYNDROME WHAT CAUSES ILIOTIBIAL BAND SYNDROME? This is an overuse condition in which inflammation results when a band of a tendon rubs over the outer bone of the knee. Although iliotibial band syndrome may be caused by direct injury to the knee, it is most often caused by the stress of long-term overuse. SYMPTOMS AND DIAGNOSIS  A person with this syndrome feels an ache or burning sensation at the outside of the knee during activity. Pain may be localized at the outside of the knee or radiate up the side of the thigh.  A person may also feel a snap when the knee is bent and then straightened.  Swelling may be absent and knee motion is normal.  The diagnosis of this disorder is typically based on the symptoms. Symptoms include pain at the outer side of the knee. Other problems with similar symptoms must also be  excluded. TREATMENT  Usually, the problem disappears if the person reduces activity and performs stretching exercises followed by muscle-strengthening exercises.  In rare cases when the syndrome does not disappear, surgery may be necessary to split the tendon so it is not stretched too tightly over the bone. OTHER KNEE INJURIES WHAT IS OSTEOCHONDRITIS DISSECANS?  Osteochondritis dissecans results from a loss of the blood supply to an area of bone at the joint surface and usually involves the  knee. The affected bone and its covering of cartilage gradually loosen and cause pain.  This problem usually arises in an active adolescent or young adult. It may be due to a slight blockage of a small artery or to an unrecognized injury or tiny fracture that damages the overlying cartilage.  Lack of a blood supply can cause bone to break down (avascular necrosis).  The involvement of several joints or the appearance of the condition in several family members may indicate that the disorder is inherited.  A person with this condition may eventually develop osteoarthritis. SYMPTOMS AND DIAGNOSIS  If normal healing does not occur, cartilage separates from the diseased bone and a fragment breaks loose into the knee joint. This causes weakness, sharp pain, and locking of the joint.  An x-ray, MRI, or arthroscopy can determine the condition of the cartilage and can be used to diagnose osteochondritis dissecans. TREATMENT  If cartilage fragments have not broken loose, a surgeon may fix the cartilage and underlying bone in place with pins or screws. These pins or screws are sunk into the cartilage to stimulate a new blood supply.  If fragments are loose, the surgeon may scrape down the cavity to reach fresh bone and add a bone graft and fix the fragments in position. Fragments that cannot be mended are removed, and the cavity is drilled or scraped to stimulate new cartilage growth.  Research is being done to assess the use of cartilage cell implants and other tissue transplants to treat this disorder. WHAT IS PLICA SYNDROME?  Plica syndrome occurs when plicae (bands of synovial tissue) are irritated by overuse or injury.  Synovial plicae are the remains of tissue pouches found in the early stages of fetal development. As the fetus develops, these pouches normally combine to form one large synovial cavity. If this process is incomplete, plicae remain as folds or bands of synovial tissue within the  knee.  Injury, chronic overuse, or inflammatory conditions are associated with this syndrome. SYMPTOMS AND DIAGNOSIS  People with this syndrome are likely to experience pain and swelling, a clicking sensation, and locking and weakness of the knee.  Because the symptoms are similar to those of some other knee problems, plica syndrome is often misdiagnosed. Diagnosis usually depends on excluding other conditions that cause similar symptoms. TREATMENT  The goal of treatment is to reduce inflammation of the synovium and thickening of the plicae.  The caregiver usually prescribes medicine to reduce inflammation.  The patient is also advised to reduce activity, apply ice and an elastic bandage to the knee, and do strengthening exercises.  A cortisone injection into the plica folds helps about half of those treated.  If treatment fails to relieve symptoms within 3 months, the caregiver may recommend arthroscopic or open surgery to remove the plicae. WHAT KINDS OF CAREGIVERS TREAT KNEE PROBLEMS?  Extensive injuries and diseases of the knees are usually treated by an orthopedic surgeon, a doctor who has been trained in the nonsurgical and surgical treatment of bones, joints, and soft tissues  such as ligaments, tendons, and muscles.  Patients seeking nonsurgical treatment of arthritis of the knee may also consult a rheumatologist. This is a caregiver specializing in the diagnosis and treatment of arthritis and related disorders. HOW CAN PEOPLE PREVENT KNEE PROBLEMS? Some knee problems cannot be foreseen or prevented. However, a person can prevent many knee problems by following these suggestions:  Before exercising or playing sports, warm up by walking or riding a stationary bicycle, and then do stretches. Stretching the muscles in the front of the thigh (quadriceps) and back of the thigh (hamstrings) reduces tension on the tendons. Stretching also relieves pressure on the knee during  activity.  Strengthen the leg muscles by doing specific exercises (for example, by walking up stairs or hills, or by riding a stationary bicycle). A supervised workout with weights is another way to strengthen the leg muscles that support the knee.  Avoid sudden changes in the intensity of exercise. Increase the force or duration of activity gradually.  Wear shoes that both fit properly and are in good condition. Good shoes will help maintain balance and leg alignment when walking or running. Knee problems can be caused by flat feet or feet that roll inward. People can often reduce some of these problems by wearing special shoe inserts (orthotics).  Maintain a healthy weight to reduce stress on the knee. Obesity increases the risk of degenerative (wearing) conditions such as osteoarthritis of the knee. WHAT TYPES OF EXERCISE ARE MOST SUITABLE FOR SOMEONE WITH KNEE PROBLEMS? Three types of exercise are best for people with arthritis:  Range-of-motion exercises help maintain normal joint movement and relieve stiffness. This type of exercise helps maintain or increase flexibility.  Strengthening exercises help maintain or increase muscle strength. Strong muscles help support and protect joints affected by arthritis.  Aerobic or endurance exercises improve function of the heart and circulation and help control weight. Weight control can be important to people who have arthritis because extra weight puts pressure on many joints. Some studies show that aerobic exercise can reduce inflammation in some joints. WHERE CAN PEOPLE FIND MORE INFORMATION ABOUT KNEE PROBLEMS? [employer]: www.niams.SelfMillionaire.nl American Academy of Orthopedic Surgeons: www.aaos.org [employer]: www.rheumatology.org [employer] Association: www.apta.org Arth[employer] Foundation: www.arthritis.org Document Released: 10/22/2003 Document Revised:  01/11/2012 Document Reviewed: 02/06/2009 [facility] Patient Information 2013 [place], [place].

## 2012-11-30 NOTE — Progress Notes (Signed)
Chief Complaint  Patient presents with  . Knee Pain    Rt side.  Started 2-3 wks ago.  She said she did get tanglesd up in a rug.  Painful when she walks and painful on the inside of the knee.    HPI: Patient comes in today for SDA for  new problem evaluation. tripeed caught herself and about a week later felt pain increasing over time on the medial right knee and kneecap.  For several weeks.   Has been getting  mass e and was told that the knee looked a bit swollen. ise.   Taking ibuprofen  2 -3  At a time.   Remote  Hx of fall left knee cap   years ago no history of a pop states it does grind and feel unstable the front of her knee but no falling.   She has lost weight down to 160 pounds before her breast reduction surgery but then gained most of it back because of stress last year but plans on trying to reinstitute Weight Watchers and exercise but needs to be evaluated. ROS: See pertinent positives and negatives per HPI.  Past Medical History  Diagnosis Date  . FIBROIDS, UTERUS 09/03/2010  . VITAMIN D DEFICIENCY 06/02/2010  . HYPERLIPIDEMIA 08/08/2007  . OBESITY 06/24/2009  . ANEMIA, IRON DEFICIENCY 07/02/2010  . GERD 08/08/2007  . Irregular menstrual cycle 06/24/2009  . LEG PAIN, RIGHT 10/03/2010  . LEG CRAMPS 06/24/2009  . SLEEPLESSNESS 11/16/2007  . FATIGUE 06/24/2009  . TREMOR 07/02/2010  . TINGLING 10/03/2010  . COUGH, CHRONIC 04/09/2010  . ABDOMINAL PAIN RIGHT LOWER QUADRANT 10/03/2010  . Acquired absence of both cervix and uterus 10/03/2010    Family History  Problem Relation Age of Onset  . Alcohol abuse Brother   . Arthritis    . Hypertension      History   Social History  . Marital Status: Single    Spouse Name: N/A    Number of Children: N/A  . Years of Education: N/A   Social History Main Topics  . Smoking status: Never Smoker   . Smokeless tobacco: None  . Alcohol Use: Yes     Comment: socially  . Drug Use: No  . Sexually Active: None   Other Topics Concern    . 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     Outpatient Encounter Prescriptions as of 11/30/2012  Medication Sig Dispense Refill  . cyclobenzaprine (FLEXERIL) 10 MG tablet TAKE 1 TABLET BY MOUTH EVERY 8 HOURS AS NEEDED FOR MUSCLE SPASMS  30 tablet  0  . ferrous sulfate 325 (65 FE) MG tablet Take 325 mg by mouth 2 (two) times daily.        Marland Kitchen ibuprofen (ADVIL,MOTRIN) 200 MG tablet Take 200 mg by mouth every 6 (six) hours as needed.        Marland Kitchen omeprazole (PRILOSEC) 20 MG capsule Take 1 capsule (20 mg total) by mouth daily.  30 capsule  3  . propranolol (INDERAL) 60 MG tablet Take 60 mg by mouth once.      . Vitamin D, Cholecalciferol, 400 UNITS CHEW Chew by mouth.        . zolpidem (AMBIEN) 10 MG tablet Take 1-1 1/2 tabs by mouth at bedtime as needed for sleep.  45 tablet  0    EXAM:  BP 134/86  Pulse 94  Temp 98.3 F (36.8 C) (Oral)  Wt 188 lb (85.276 kg)  SpO2 97%  There is no height on file to calculate BMI. Wt Readings from Last 3 Encounters:  11/30/12 188 lb (85.276 kg)  10/12/11 178 lb (80.74 kg)  05/25/11 184 lb (83.462 kg)    GENERAL: vitals reviewed and listed above, alert, oriented, appears well hydrated and in no acute distress  HEENT: atraumatic, conjunctiva  clear, no obvious abnormalities on inspection of external nose and ears  NECK: no obvious masses on inspection palpation   MS: moves all extremities without noticeable focal  Abnormality right knees shows +1 patellar effusion it is not warm or hot or red normal range of motion with crepitus some tenderness along the medial joint line  seems stable but difficult to examine compared to the left. No lower extremity edema reports pain on extension and flexion.  PSYCH: pleasant and cooperative, no obvious depression or anxiety  ASSESSMENT AND PLAN:  Discussed the following assessment and plan:  1. Medial  knee pain  Ambulatory referral to Sports Medicine   With some swelling possible strain plus minus arthritis discussed options   will eventually need x-ray plan sports medicine consult for exercise management plan that she is trying to implement to lose weight and be healthy. For now walking as tolerated would be okay ice after activity.  -Patient advised to return or notify health care team  if symptoms worsen or persist or new concerns arise.  Patient Instructions  There seem  To be a medial collateral strain pain and some swelling under the knee cap.  Could have arthritis or other  Derangement .  Will arrange a sports medicine evaluation  And advice for rehab.  Exercises.     Knee Problems, Questions and Answers Knee problems are common in young people and adults. This document contains general information about several knee problems. It includes:  Descriptions of the different parts of the knee.  Diagram of the different parts of the knee. Individual sections describe specific types of knee injuries and their:   Symptoms.  Diagnosis.  Treatment. Information on how to prevent these problems is also provided. WHAT DO THE KNEES DO? HOW DO THEY WORK? The knees provide stable support for the body. Knees allow the legs to bend and straighten. Flexibility and stability are needed for standing and for motions like:  Walking.  Jumping.  Running.  Turning.  Crouching. Supporting and moving parts help the knees do their job, these parts include:  Bones.  Cartilage.  Muscles.  Ligaments.  Tendons. Any of these parts can be involved in knee pain or a knee not working right (dysfunction). WHAT CAUSES KNEE PROBLEMS? There are two general kinds of knee problems: mechanical and inflammatory. Mechanical Knee Problems are problems that result from:  Injury, such as a direct blow or sudden movements that strain the knee beyond its normal range of movement.  Overuse, repetitive  motions that produce partial fiber failure in tendon or ligaments.  Osteoarthritis in the knee, result from wear and tear on its parts. Inflammatory Knee Problems are inflammation that occurs in certain rheumatic diseases, such as:   Rheumatoid arthritis.  Systemic lupus erythematosus. JOINT BASICS  The point at which two or more bones are connected is called a joint.  In all joints, the bones are kept from grinding against each other by the tissue lining the ends of the bones called cartilage.  Bones are joined to bones by strong,  elastic bands of tissue called ligaments.  Tendons are tough cords of tissue that connect muscle to bone.  Muscles work in opposing pairs to bend and straighten joints. While muscles are not technically part of a joint, they are important because strong muscles help support and protect joints. WHAT ARE THE PARTS OF THE KNEE? Like any joint, the knee is composed of bones and cartilage, ligaments, tendons, and muscles. BONES AND CARTILAGE The knee joint is the junction of four bones:   The thigh bone or upper leg bone (femur).  The shin bone or larger bone of the lower leg (tibia).  The small bone on the outside of the knee where ligaments attach (fibula).  The knee cap (patella). The patella is 2 to 3 inches wide and 3 to 4 inches long. It sits over the other bones at the front of the knee joint and slides when the leg moves. It protects the knee and gives leverage to muscles. The ends of the bones in the knee joint are covered with articular cartilage, a tough, elastic material that helps absorb shock and allows the knee joint to move smoothly. Separating the bones of the knee are pads of connective tissue which are called meniscus. The plural is menisci. The menisci are divided into two crescent-shaped discs positioned between the tibia and femur on the outer and inner sides of each knee. The two menisci in each knee act as shock absorbers, cushioning the  lower part of the leg from the weight of the rest of the body as well as enhancing stability. MUSCLES There are two groups of muscles at the knee.  The quadriceps muscle are four muscles on the front of the thigh that work to straighten the leg from a bent position.  The hamstring muscles, which bend the leg at the knee, run along the back of the thigh from the hip to just below the knee. Keeping these muscles strong with exercises such as walking up stairs or riding a stationary bicycle helps support and protect the knee. TENDONS AND LIGAMENTS  The quadriceps tendon connects the quadriceps muscle to the patella and provides the power to extend the leg. The patella is a bone within this tendon. Four ligaments connect the femur and tibia and give the joint strength and stability:  The medial collateral ligament (MCL) provides stability to the inner (medial) part of the knee.  The lateral collateral ligament (LCL) provides stability to the outer (lateral) part of the knee.  The anterior cruciate ligament (ACL), in the center of the knee, limits rotation and the forward movement of the tibia.  The posterior cruciate ligament (PCL), also in the center of the knee, limits backward movement of the tibia.  Other ligaments are part of the knee capsule. This is the protective, fiber-like structure that wraps around the knee joint. Inside the capsule, the joint is lined with a thin, soft tissue called synovium. This tissue produces the fluid (synovial fluid) which lubricates the joint. HOW ARE KNEE PROBLEMS DIAGNOSED? Caregivers use several methods to diagnose knee problems:  Medical history--The patient tells the caregiver details about:  Symptoms.  Injuries.  Medical conditions.  Physical examination-- To help the caregiver understand how the knee is working, the patient may be asked to stand, walk or squat. The caregiver, to discover the limits of movement and the location of pain in the  knee, may:  Bend the knee.  Straighten the knee.  Rotate (turn) turn the knee.  Press on  the knee to feel for injury.  Diagnostic tests--The caregiver uses one or more stress tests to determine the nature of a knee problem.  X-ray (radiography)--An x-ray beam is passed through the knee to produce a two-dimensional picture of the bones.  Computerized axial tomography (CAT) scan--X-rays are passed through the knee at different angles, detected by a scanner, and analyzed by a computer. This produces a series of clear cross-sectional images ("slices") of the knee tissues on a computer screen. CAT scan images show details of bone structure, show soft tissues such as ligaments or muscles to a limited degree, can give a three-dimensional view of the knee.  Bone scan (radionuclide scanning)--A very small amount of radioactive material is injected into the patient's bloodstream and detected by a scanner. This test detects blood flow to the bone and cell activity within the bone and can show abnormalities. This may help the caregiver understand what is wrong.  Magnetic resonance imaging (MRI)--Energy from a powerful magnet (rather than x-rays) stimulates knee tissue to produce signals. These signals are detected by a scanner and analyzed by a computer. Like a CAT scan, a computer is used to produce three-dimensional views of the knee during MRI. The MRI provides precise details of ligament, tendon and cartilage structure.  Arthroscopy--The surgeon manipulates a small, lighted optic tube (arthroscope) that has been inserted into the joint through a small incision in the knee. Images of the inside of the knee joint are projected onto a television screen. While the arthroscope is inside the knee joint, removal of loose pieces of bone or cartilage, or the repair of torn ligaments and menisci can be preformed.  Biopsy--The caregiver removes tissue to examine under a microscope.  Aspiration of fluid from the  knee--The laboratory will analyze the fluid for cell count, presence of crystals that produce inflammation (as in gout where Uric Acid crystals are the cause of the inflammation) and check for infection. WHAT IS ARTHRITIS OF THE KNEE?   Arthritis of the knee is most often osteoarthritis. In this disease, the cartilage in the joint gradually wears away. It may be caused by excess stress on the joint from:  Trauma.  Deformity.  Repeated injury.  Excess weight.  It most often affects middle-aged and older people. A young person who develops osteoarthritis may have an inherited form of the disease or may have experienced continuous irritation from an unrepaired knee injury or other injury.  In rheumatoid arthritis, which can also affect the knees, the joint becomes inflamed and cartilage may be destroyed. Rheumatoid arthritis often affects people at an earlier age than osteoarthritis and often involves multiple joints.  Arthritis can also affect supporting structures such as muscles, tendons, and ligaments. WHAT ARE SIGNS OF ARTHRITIS OF THE KNEE?  Someone who has arthritis of the knee may experience:  Pain.  Swelling/ fluid on the knee.  A decrease in knee motion.  A common symptom is morning stiffness. This generally improves as the person moves around.  Sometimes the joint locks or clicks. These signs may occur in other knee disorders as well.  The caregiver may confirm the diagnosis by:  Performing a physical examination.  Examining x-rays, which typically show a loss of joint space.  Blood tests may be helpful for diagnosing rheumatoid arthritis, but other tests may be needed.  Analyzing fluid from the knee joint may be helpful in diagnosing some kinds of arthritis.  The caregiver may use arthroscopy to directly see damage to cartilage, tendons, and ligaments and  to confirm a diagnosis. Arthroscopy is usually done only if a repair procedure is to be performed. WHAT IS  TREATMENT FOR ARTHRITIS OF THE KNEE?  Most often osteoarthritis of the knee is treated with pain-reducing medicines, such as:  Nonsteroidal anti-inflammatory drugs (NSAID's)  Exercises to restore joint movement and strengthen the knee.  Losing excess weight can also help people with osteoarthritis.  Rheumatoid arthritis of the knee may require physical therapy and more powerful medicines. In people with severe arthritis of the knee, a seriously damaged joint may need to be replaced with an artificial one. WHAT IS CHONDROMALACIA?  Chondromalacia refers to softening of the articular cartilage of the knee cap. This disorder occurs most often in young adults. Instead of gliding smoothly across the lower end of the thigh bone, the knee cap rubs against it, thereby roughening the cartilage underneath the knee cap. The damage may range from a slightly abnormal surface of the cartilage to a surface that has been worn away to the bone. It can be caused by:  Injury.  Overuse.  Misalignment of the patellar tendon.  Muscle weakness (generally the quadriceps).  Chondromalacia related to injury occurs when a blow to the knee cap tears off either a small piece of cartilage or a large fragment containing a piece of bone. WHAT ARE SYMPTOMS OF CHONDROMALACIA?  The most frequent symptom is a dull pain around or under the knee cap. This pain worsens when walking down stairs, or hills or sitting with the knee bent for long periods of time.  A person may also feel pain when climbing stairs or when the knee bears weight as it straightens.  The disorder is common in:  Runners.  Skiers.  Cyclists.  Soccer players.  A patient's description of symptoms, the physical exam, and a follow-up x-ray usually help the caregiver make a diagnosis.  Although arthroscopy can confirm the diagnosis. It is not used unless the condition requires extensive treatment. WHAT IS TREATMENT FOR CHONDROMALACIA?  Many  caregivers recommend that patients with chondromalacia perform low-impact exercises. The knee must not bend more than 90 degrees. This includes:  Swimming.  Riding a stationary bicycle.  Using a cross-country ski machine.  Electrical stimulation may also be used to strengthen the muscles.  If these treatments do not improve the condition, the caregiver may perform arthroscopic. Goals of this surgery include smoothing the surface of the cartilage and "washing out" the cartilage fragments that cause the joint to catch during bending and straightening.  In more severe cases, surgery may be necessary to:  Correct the alignment of the knee cap.  Decrease the pressure on the undersurface of the patella.  Relieve friction with the cartilage.  Reposition parts that are out of alignment. WHAT CAUSES INJURIES TO THE MENISCUS? The meniscus is easily injured by the force of rotating the knee while bearing weight. A partial or total tear may occur when a person quickly twists or rotates the upper leg while the foot stays still. For example, when dribbling a basketball around an opponent or turning to hit a tennis ball. If the tear is tiny, the meniscus stays connected to the front and back of the knee. If the tear is large, the meniscus may be left in an abnormally mobile position which produces instability. The seriousness of a tear depends on its location and extent. WHAT ARE SYMPTOMS OF INJURIES TO THE MENISCUS?  Pain, particularly when the knee is straightened.  If the pain is mild, the patient may  continue with normal activity.  Severe pain may occur if a fragment of the meniscus catches between the femur and the tibia.  Swelling may occur soon after injury if blood vessels are disrupted. Swelling may occur several hours later if the joint fills with fluid produced by the joint lining (synovium) as a result of inflammation. If the synovium is injured, it may become inflamed and produce fluid.  This makes the knee swell.  Sometimes, an injury that occurred in the past but was not treated becomes painful months or years later.  After any injury, the knee may click, lock, feel weak, or give way without warning.  Although symptoms of meniscal injury may disappear on their own (particularly with a stable meniscal tear), they frequently persist or return and require treatment. HOW IS INJURY TO THE MENISCUS DIAGNOSED?  The caregiver will listen to the patient's description of the pain and swelling. The caregiver will perform a physical examination and take x-rays of the knee. The examination may include a test in which the caregiver bends the leg, and then rotates the leg outward and inward while extending it. Pain along the joint line or an audible click suggests a meniscal tear.  An MRI may be done.  Occasionally, the caregiver may use arthroscopy without obtaining the MRI to diagnose and treat a meniscal tear. WHAT IS TREATMENT FOR INJURY TO THE MENISCUS?  The caregiver may recommend a muscle-strengthening program if:  The tear is minor.  The pain and symptoms are improving.  Exercises for meniscal problems are best started with guidance from a caregiver and physical therapist or athletic trainer. The therapist will make sure that the patient does the exercises properly and without risking new or repeat injury. The following exercises after injury to the meniscus are designed to build up the quadriceps and hamstring muscles and increase flexibility and strength.  Warming up the joint by riding a stationary bicycle, then straightening and raising the leg (but not straightening it too much).  Extending the leg while sitting (a weight may be worn on the ankle for this exercise).  Raising the leg while lying on the stomach.  Exercising in a pool (walking as fast as possible in chest-deep water, performing small flutter kicks while holding onto the side of the pool, and raising each  leg to 90 in chest-deep water while pressing the back against the side of the pool).  If the tear is more extensive, the caregiver may perform arthroscopic with or without open surgery to see the extent of injury and to repair the tear. The caregiver can sew the meniscus back in place if the patient is relatively young, if the injury is in an area with a good blood supply, and if the ligaments are intact. Most young athletes are able to return to active sports after meniscus repair.  If the patient is elderly or the tear is in an area with a poor blood supply, the caregiver may trim a small portion of the meniscus to even the surface. In rare cases, the caregiver removes the entire meniscus. Osteoarthritis is more likely to develop in the knee if the entire meniscus is removed.  Recovery after surgical repair takes several weeks to months. Activity after surgery is slightly more restricted than when the meniscus is partially removed. However, putting weight on the joint actually helps recovery. Regardless of the form of surgery, rehabilitation usually includes:  Walking.  Bending the legs.  Doing exercises that stretch and build up leg  muscles.  The best results of treatment for meniscal injury are obtained in people who:  Do not have articular cartilage changes.  Have an intact ACL. LIGAMENT INJURIES WHAT ARE THE CAUSES OF ANTERIOR AND POSTERIOR CRUCIATE LIGAMENT INJURIES?  Injury to the cruciate ligaments is sometimes referred to as a "sprain".  The ACL is most often stretched or torn (or both) by a sudden twisting or pushing the ACL beyond its normal range. For example, when the feet are planted one way and the knee rotates in the opposite direction.  The PCL is most often injured by a direct impact, such as in an automobile accident or football tackle. WHAT ARE SYMPTOMS?  Injury to a cruciate ligament may not cause pain. Symptoms may include:  A popping sound  Buckling when  trying to stand on the leg.  The caregiver will perform several physical exam tests. These tests are to see whether the parts of the knee stay in proper position when pressure is applied in different directions.  A thorough examination is essential. An MRI is very accurate in detecting a complete tear. Arthroscopy may be the only reliable means of detecting a partial one. TREATMENT  For an incomplete tear, the caregiver may recommend that the patient begin an exercise program to strengthen surrounding muscles.  The caregiver may also prescribe a brace to protect the knee during activity.  For a completely torn ACL in an active athlete and motivated person, the caregiver is likely to recommend surgery. The surgeon may reconstruct the torn ligament by using:  A piece (graft) of healthy ligament from the patient (autograft)  A piece of ligament from a tissue bank (allograft). One of the most important elements in a patient's successful recovery after cruciate ligament surgery is a 4- to 37-month exercise program. This program may involve using special exercise equipment at a rehabilitation or sports center. Successful surgery special exercises will allow the patient to return to a normal, active lifestyle. MEDIAL AND LATERAL COLLATERAL LIGAMENT INJURIES WHAT IS THE MOST COMMON CAUSE OF MEDIAL AND LATERAL COLLATERAL LIGAMENT INJURIES? The MCL is more commonly injured than the LCL. The cause is most often a blow to the outer side of the knee. This injury stretches and tears the ligament on the inner side of the knee. Such blows frequently occur in contact sports like football or hockey. SYMPTOMS AND DIAGNOSIS  When injury to the MCL occurs, a person may feel a pop and the knee may buckle sideways.  Pain and swelling are common.  A thorough exam is needed to determine the kind and extent of the injury.  To diagnose a collateral ligament injury, the caregiver exerts pressure on the side of the  knee to determine the degree of pain and the looseness of the joint.  An MRI is helpful in diagnosing injuries to these ligaments. TREATMENT  Most sprains of the collateral ligaments will heal if the patient follows a prescribed exercise program.  In addition to exercise, the caregiver may recommend ice packs to reduce pain and swelling and a small sleeve-type brace to protect and stabilize the knee.  A sprain may take 4 to 6 weeks to heal.  A patient with a severely sprained or torn collateral ligament may also have a torn ACL. This usually requires surgical repair. TENDON INJURIES AND DISORDERS WHAT CAUSES TENDINITIS AND RUPTURED TENDONS?  Knee tendon injuries range from tendinitis to a torn (ruptured) tendon.  If a person overuses a tendon during certain activities such  as dancing, cycling, or running, the tendon stretches like a worn-out rubber band and becomes inflamed.  Also, trying to break a fall may cause the quadriceps muscles to contract and tear the quadriceps tendon above the patella or the patellar tendon below the patella. This type of injury is most likely to happen in older people.  Tendinitis of the patellar tendon is sometimes called jumper's knee because in sports that require jumping, such as basketball or volleyball, the muscle contraction and force of hitting the ground after a jump strain the tendon.  After repeated stress, the tendon may become inflamed or tear. SYMPTOMS AND DIAGNOSIS  People with tendinitis often have tenderness at the point where the patellar tendon meets the bone. In addition, they may feel pain during running, fast walking, or jumping.  A complete rupture of the quadriceps or patellar tendon is painful. It also makes it difficult for a person to bend, extend, or lift the leg against gravity.  If there is not much swelling, the caregiver may be able to feel a defect in the tendon near the tear during a physical examination.  An x-ray will  show that the patella is lower than normal in a quadriceps tendon tear and higher than normal in a patellar tendon tear. The caregiver may use an MRI to confirm a partial or total tear. TREATMENT  Initially, the caregiver may ask a patient with tendinitis to rest, elevate, and apply ice to the knee and to take medicines to relieve pain and decrease inflammation and swelling.  If the quadriceps or patellar tendon is completely ruptured, a surgeon will reattach the ends. After surgery, the patient will wear a cast or brace for 3 to 6 weeks and use crutches.  For a partial tear, the caregiver might apply a cast or an extension knee brace without performing surgery.  Rehabilitating a partial or complete tear of a tendon requires an exercise program that is similar to but less forceful than that used for ligament injuries. The goals of exercise are to restore the ability to bend and straighten the knee and to strengthen the leg to prevent repeat injury. A rehabilitation program may last 4 to 6 months. A patient can return to many activities before then. OSGOOD-SCHLATTER DISEASE WHAT CAUSES OSGOOD-SCHLATTER DISEASE?  Osgood-Schlatter disease is caused by repetitive stress or tension on part of the growth area of the upper tibia (the apophysis). Symptoms included inflammation of the patellar tendon and surrounding soft tissues at the point where the tendon attaches to the tibia.  The disease may also be associated with an injury in which the tendon is stretched so much that it tears away from the tibia and takes a fragment of bone with it.  The disease generally affects active young people. Particularly boys between the ages of 21 and 44, who play games or sports that include frequent running and jumping and who have open growth plates. SYMPTOMS AND DIAGNOSIS  People with this disease experience pain just below the knee joint. This pain usually worsens with activity and is relieved by rest.  The bony  bump that is particularly painful when pressed may increase in size at the upper edge of the tibia (below the knee cap).  Usually motion of the knee is not affected.  Pain may last a few months and may come back with periods of high activity until the child's growth is completed.  Osgood-Schlatter disease is most often diagnosed by the symptoms and the physical exam. An  x-ray may be normal, or show an injury. An x-ray, more typically, will show that the growth area is fragmented. TREATMENT  Usually, the disease goes away without aggressive treatment.  Applying ice to the knee when pain begins helps relieve inflammation. Applying ice is sometimes used along with stretching and strengthening exercises.  The caregiver may advise the patient to limit participation in vigorous sports. Children who wish to continue less stressful sports activities may need to wear knee pads for protection and apply ice to the knee after activity. If there is a great deal of pain, sports activities may be limited until discomfort becomes tolerable. ILIOTIBIAL BAND SYNDROME WHAT CAUSES ILIOTIBIAL BAND SYNDROME? This is an overuse condition in which inflammation results when a band of a tendon rubs over the outer bone of the knee. Although iliotibial band syndrome may be caused by direct injury to the knee, it is most often caused by the stress of long-term overuse. SYMPTOMS AND DIAGNOSIS  A person with this syndrome feels an ache or burning sensation at the outside of the knee during activity. Pain may be localized at the outside of the knee or radiate up the side of the thigh.  A person may also feel a snap when the knee is bent and then straightened.  Swelling may be absent and knee motion is normal.  The diagnosis of this disorder is typically based on the symptoms. Symptoms include pain at the outer side of the knee. Other problems with similar symptoms must also be excluded. TREATMENT  Usually, the problem  disappears if the person reduces activity and performs stretching exercises followed by muscle-strengthening exercises.  In rare cases when the syndrome does not disappear, surgery may be necessary to split the tendon so it is not stretched too tightly over the bone. OTHER KNEE INJURIES WHAT IS OSTEOCHONDRITIS DISSECANS?  Osteochondritis dissecans results from a loss of the blood supply to an area of bone at the joint surface and usually involves the knee. The affected bone and its covering of cartilage gradually loosen and cause pain.  This problem usually arises in an active adolescent or young adult. It may be due to a slight blockage of a small artery or to an unrecognized injury or tiny fracture that damages the overlying cartilage.  Lack of a blood supply can cause bone to break down (avascular necrosis).  The involvement of several joints or the appearance of the condition in several family members may indicate that the disorder is inherited.  A person with this condition may eventually develop osteoarthritis. SYMPTOMS AND DIAGNOSIS  If normal healing does not occur, cartilage separates from the diseased bone and a fragment breaks loose into the knee joint. This causes weakness, sharp pain, and locking of the joint.  An x-ray, MRI, or arthroscopy can determine the condition of the cartilage and can be used to diagnose osteochondritis dissecans. TREATMENT  If cartilage fragments have not broken loose, a surgeon may fix the cartilage and underlying bone in place with pins or screws. These pins or screws are sunk into the cartilage to stimulate a new blood supply.  If fragments are loose, the surgeon may scrape down the cavity to reach fresh bone and add a bone graft and fix the fragments in position. Fragments that cannot be mended are removed, and the cavity is drilled or scraped to stimulate new cartilage growth.  Research is being done to assess the use of cartilage cell implants  and other tissue transplants to treat this  disorder. WHAT IS PLICA SYNDROME?  Plica syndrome occurs when plicae (bands of synovial tissue) are irritated by overuse or injury.  Synovial plicae are the remains of tissue pouches found in the early stages of fetal development. As the fetus develops, these pouches normally combine to form one large synovial cavity. If this process is incomplete, plicae remain as folds or bands of synovial tissue within the knee.  Injury, chronic overuse, or inflammatory conditions are associated with this syndrome. SYMPTOMS AND DIAGNOSIS  People with this syndrome are likely to experience pain and swelling, a clicking sensation, and locking and weakness of the knee.  Because the symptoms are similar to those of some other knee problems, plica syndrome is often misdiagnosed. Diagnosis usually depends on excluding other conditions that cause similar symptoms. TREATMENT  The goal of treatment is to reduce inflammation of the synovium and thickening of the plicae.  The caregiver usually prescribes medicine to reduce inflammation.  The patient is also advised to reduce activity, apply ice and an elastic bandage to the knee, and do strengthening exercises.  A cortisone injection into the plica folds helps about half of those treated.  If treatment fails to relieve symptoms within 3 months, the caregiver may recommend arthroscopic or open surgery to remove the plicae. WHAT KINDS OF CAREGIVERS TREAT KNEE PROBLEMS?  Extensive injuries and diseases of the knees are usually treated by an orthopedic surgeon, a doctor who has been trained in the nonsurgical and surgical treatment of bones, joints, and soft tissues such as ligaments, tendons, and muscles.  Patients seeking nonsurgical treatment of arthritis of the knee may also consult a rheumatologist. This is a caregiver specializing in the diagnosis and treatment of arthritis and related disorders. HOW CAN PEOPLE  PREVENT KNEE PROBLEMS? Some knee problems cannot be foreseen or prevented. However, a person can prevent many knee problems by following these suggestions:  Before exercising or playing sports, warm up by walking or riding a stationary bicycle, and then do stretches. Stretching the muscles in the front of the thigh (quadriceps) and back of the thigh (hamstrings) reduces tension on the tendons. Stretching also relieves pressure on the knee during activity.  Strengthen the leg muscles by doing specific exercises (for example, by walking up stairs or hills, or by riding a stationary bicycle). A supervised workout with weights is another way to strengthen the leg muscles that support the knee.  Avoid sudden changes in the intensity of exercise. Increase the force or duration of activity gradually.  Wear shoes that both fit properly and are in good condition. Good shoes will help maintain balance and leg alignment when walking or running. Knee problems can be caused by flat feet or feet that roll inward. People can often reduce some of these problems by wearing special shoe inserts (orthotics).  Maintain a healthy weight to reduce stress on the knee. Obesity increases the risk of degenerative (wearing) conditions such as osteoarthritis of the knee. WHAT TYPES OF EXERCISE ARE MOST SUITABLE FOR SOMEONE WITH KNEE PROBLEMS? Three types of exercise are best for people with arthritis:  Range-of-motion exercises help maintain normal joint movement and relieve stiffness. This type of exercise helps maintain or increase flexibility.  Strengthening exercises help maintain or increase muscle strength. Strong muscles help support and protect joints affected by arthritis.  Aerobic or endurance exercises improve function of the heart and circulation and help control weight. Weight control can be important to people who have arthritis because extra weight puts pressure on many  joints. Some studies show that aerobic  exercise can reduce inflammation in some joints. WHERE CAN PEOPLE FIND MORE INFORMATION ABOUT KNEE PROBLEMS? [employer] of Arthritis and Musculoskeletal and Skin: www.niams.SelfMillionaire.nl American Academy of Orthopedic Surgeons: www.aaos.org [employer] of Rheumatology: www.rheumatology.org American Physical Therapy Association: www.apta.org Arthritis Foundation: www.arthritis.org Document Released: 10/22/2003 Document Revised: 01/11/2012 Document Reviewed: 02/06/2009 [facility] Patient Information 2013 [place], [place].    Neta Mends. Panosh M.D.

## 2012-12-05 ENCOUNTER — Other Ambulatory Visit: Payer: Self-pay | Admitting: Internal Medicine

## 2012-12-05 ENCOUNTER — Ambulatory Visit (HOSPITAL_COMMUNITY)
Admission: RE | Admit: 2012-12-05 | Discharge: 2012-12-05 | Disposition: A | Discharge status: Home / Self Care | Payer: 59 | Source: Ambulatory Visit | Attending: Sports Medicine | Admitting: Sports Medicine

## 2012-12-05 ENCOUNTER — Ambulatory Visit (INDEPENDENT_AMBULATORY_CARE_PROVIDER_SITE_OTHER): Payer: 59 | Admitting: Sports Medicine

## 2012-12-05 ENCOUNTER — Encounter: Payer: Self-pay | Admitting: Sports Medicine

## 2012-12-05 VITALS — BP 115/79 | HR 70 | Ht 62.0 in | Wt 188.0 lb

## 2012-12-05 DIAGNOSIS — M25569 Pain in unspecified knee: Secondary | ICD-10-CM

## 2012-12-05 DIAGNOSIS — W19XXXA Unspecified fall, initial encounter: Secondary | ICD-10-CM | POA: Insufficient documentation

## 2012-12-05 MED ORDER — MELOXICAM 15 MG PO TABS: 15.0000 mg | ORAL_TABLET | Freq: Every day | ORAL | Status: DC

## 2012-12-05 NOTE — Assessment & Plan Note (Signed)
Knee pain OSA vs medial meniscal tear

## 2012-12-05 NOTE — Progress Notes (Signed)
Subjective:    Tiffany Carson is a 47 y.o. female presenting w/ R knee pain. Fell 3-4 wks ago. Fell after turning around and hit knees. Happened once prior in October. No pain or swelling after initial fall. Did not ice knees. Pain and swelling started about 1 wk after injury. Pain is a grinding sensation and ache on medial side. Now w/ some hip and groin tightness. Feels like knee is very moveable. Pain worsened when still for prolonged period, and after full day of ambulation. No am stiffness in knees. No previous workup. Ibuprofen 400-600mg  w/o much benefit. Ice w/ mild benefit. Motivated to lose wt. 20 yrs ago cycling off an on. Active lifestyle (walking daily 30 min daily prior to the winter). No loss of sensation in feet/toes.    Review of Systems Pertinent items are noted in HPI.   Objective:    BP 115/79  Pulse 70  Ht 5\' 2"  (1.575 m)  Wt 188 lb (85.276 kg)  BMI 34.39 kg/m2 Right knee:  Slightly positive McMurray's on Medial aspect. Ant/Post drawer test neg. Mild valgus and varus laxity. ROM normal. 2+ patellar crepitus  Left knee:  McMurray's, Ant/Post drawer tests neg     Assessment:   R knee OSA w/ possible small meniscal tear   Plan:   Meloxicam 15mg  Qday Xray today: AP, Lat, Flexion 30%, sunrise view Continue physical activity (avoid high wt bearing exercises) Body Helix knee brace given F/u in 3 wks, consider cortisone injections if symptoms warrant.

## 2012-12-07 ENCOUNTER — Other Ambulatory Visit: Payer: Self-pay | Admitting: Family Medicine

## 2012-12-07 NOTE — Telephone Encounter (Signed)
Sent to [facility] for authorization. 

## 2012-12-07 NOTE — Telephone Encounter (Signed)
Pt is requesting refills sent to [facility]  She was last seen on 11/30/12 and last filled on 10/05/12 #45 with 0 additional refills.  She has a follow up on 01/09/13.  Please advise.  Thanks!!

## 2012-12-09 MED ORDER — ZOLPIDEM TARTRATE 10 MG PO TABS: ORAL_TABLET | ORAL | Status: DC

## 2012-12-09 NOTE — Telephone Encounter (Signed)
Pt would like 90 day supply of ambien. Pt is out

## 2012-12-09 NOTE — Telephone Encounter (Signed)
Ok to send in 90 days  No refills

## 2012-12-26 ENCOUNTER — Ambulatory Visit (INDEPENDENT_AMBULATORY_CARE_PROVIDER_SITE_OTHER): Payer: 59 | Admitting: Sports Medicine

## 2012-12-26 ENCOUNTER — Other Ambulatory Visit (INDEPENDENT_AMBULATORY_CARE_PROVIDER_SITE_OTHER): Payer: 59

## 2012-12-26 ENCOUNTER — Encounter: Payer: Self-pay | Admitting: Sports Medicine

## 2012-12-26 VITALS — BP 119/81 | HR 82 | Ht 62.0 in | Wt 188.0 lb

## 2012-12-26 DIAGNOSIS — Z0000 Encounter for general adult medical examination without abnormal findings: Secondary | ICD-10-CM

## 2012-12-26 DIAGNOSIS — M25569 Pain in unspecified knee: Secondary | ICD-10-CM

## 2012-12-26 DIAGNOSIS — M25561 Pain in right knee: Secondary | ICD-10-CM

## 2012-12-26 LAB — CBC WITH DIFFERENTIAL/PLATELET
Basophils Absolute: 0 10*3/uL (ref 0.0–0.1)
Eosinophils Absolute: 0.1 10*3/uL (ref 0.0–0.7)
Eosinophils Relative: 1.5 % (ref 0.0–5.0)
HCT: 46 % (ref 36.0–46.0)
Hemoglobin: 15.6 g/dL — ABNORMAL HIGH (ref 12.0–15.0)
Lymphs Abs: 1.6 10*3/uL (ref 0.7–4.0)
MCV: 90.7 fl (ref 78.0–100.0)
Monocytes Absolute: 0.6 10*3/uL (ref 0.1–1.0)
Neutro Abs: 4.4 10*3/uL (ref 1.4–7.7)
Platelets: 425 10*3/uL — ABNORMAL HIGH (ref 150.0–400.0)
RDW: 13.7 % (ref 11.5–14.6)
WBC: 6.8 10*3/uL (ref 4.5–10.5)

## 2012-12-26 LAB — HEPATIC FUNCTION PANEL
AST: 20 U/L (ref 0–37)
Albumin: 4.2 g/dL (ref 3.5–5.2)
Alkaline Phosphatase: 58 U/L (ref 39–117)
Total Protein: 7.1 g/dL (ref 6.0–8.3)

## 2012-12-26 LAB — LIPID PANEL
HDL: 55.2 mg/dL (ref >39.00)
Total CHOL/HDL Ratio: 3
Triglycerides: 62 mg/dL (ref 0.0–149.0)
VLDL: 12.4 mg/dL (ref 0.0–40.0)

## 2012-12-26 LAB — BASIC METABOLIC PANEL
BUN: 8 mg/dL (ref 6–23)
Chloride: 105 mEq/L (ref 96–112)
Glucose, Bld: 98 mg/dL (ref 70–99)
Potassium: 4.1 mEq/L (ref 3.5–5.1)
Sodium: 139 mEq/L (ref 135–145)

## 2012-12-26 NOTE — Progress Notes (Signed)
Subjective:    Tiffany Carson is a 47 y.o. female who presents for f/u for R knee pain. Pain is about 50% better. Wearing brace during exercise and for 15 min afterwords. Taking Meloxicam daily w/ some improvement. Denies any loss of sensation or strength in lower extremity. Walking 2.64mi daily. No mechanical symptoms. Recent x-rays showed some mild degenerative changes in the knee but certainly not marked.   The following portions of the patient's history were reviewed and updated as appropriate: allergies and problem list.   Review of Systems Pertinent items are noted in HPI.   Objective:    BP 119/81  Pulse 82  Ht 5\' 2"  (1.575 m)  Wt 188 lb (85.276 kg)  BMI 34.38 kg/m2 Gen: NAD, WND HEENT: MMM, EOMI Skin: Warm, well perfused, intact, no rash Neuro: CN grossly intact Right knee: Full range of motion. No effusion. She is tender to palpation along the medial joint line but a negative McMurray's. No tenderness laterally. Good ligamentous stability. Neurovascularly intact distally   Consent obtained and verified. Sterile betadine prep. Furthur cleansed with alcohol. Topical analgesic spray: Ethyl chloride. Joint: R Knee Approached in typical fashion with: lateral Completed without difficulty Meds: 3cc 1% xylocaine, 1 cc 40mg /ml Depomedrol Needle: 25g 1.5 inch  Aftercare instructions and Red flags advised.    Assessment:  Right knee pain likely secondary to mild osteoarthritis versus degenerative meniscal tear   Plan:   1. Joint injection today 2. Continue Meloxicam prn 3. Continue w/ body helix  4. Exercises for quad and hip abductor strengthening reviewed 5. F/U PRN... if symptoms persist I would consider merits of further diagnostic imaging.

## 2013-01-02 ENCOUNTER — Other Ambulatory Visit: Payer: BC Managed Care – PPO

## 2013-01-09 ENCOUNTER — Encounter: Payer: Self-pay | Admitting: Internal Medicine

## 2013-01-09 ENCOUNTER — Ambulatory Visit (INDEPENDENT_AMBULATORY_CARE_PROVIDER_SITE_OTHER): Payer: 59 | Admitting: Internal Medicine

## 2013-01-09 VITALS — BP 138/86 | HR 81 | Temp 98.7°F | Ht 62.5 in | Wt 179.0 lb

## 2013-01-09 DIAGNOSIS — E785 Hyperlipidemia, unspecified: Secondary | ICD-10-CM

## 2013-01-09 DIAGNOSIS — M62838 Other muscle spasm: Secondary | ICD-10-CM

## 2013-01-09 DIAGNOSIS — G4700 Insomnia, unspecified: Secondary | ICD-10-CM

## 2013-01-09 DIAGNOSIS — R259 Unspecified abnormal involuntary movements: Secondary | ICD-10-CM

## 2013-01-09 DIAGNOSIS — Z6832 Body mass index (BMI) 32.0-32.9, adult: Secondary | ICD-10-CM

## 2013-01-09 DIAGNOSIS — Z0000 Encounter for general adult medical examination without abnormal findings: Secondary | ICD-10-CM | POA: Insufficient documentation

## 2013-01-09 NOTE — Progress Notes (Signed)
Chief Complaint  Patient presents with  . Annual Exam    HPI: Patient comes in today for Preventive Health Care visit   No major change in health status since last visit .   Saw dr Margaretha Sheffield  Mild arthritis  In knee and given  Cortisone.  Helping  Walking 2 miles per day mostly    Flexeril as needed.  Last month not a lot  Usually for neck.   Stress   Muscle  mych better since breast surgery  Sleep is still problmeatic an relying heavily  On ambien.    ( 15 mg generic ) considering To have sleep study with dr Richardean Chimera.  Hard to fall alseep and then fear of not sleeping.   Dr D advise  Considering to  Try  Low dose   Hrt?  No real hot flushes   ocass ibu prn .    Tremor :  Propranolol   LA    Per neuro For a year.  Still has problem writing and gripping with right had at times  No se of med  ROS:  GEN/ HEENT: No fever, significant weight changes sweats headaches vision problems hearing changes, CV/ PULM; No chest pain shortness of breath cough, syncope,edema  change in exercise tolerance. GI /GU: No adominal pain, vomiting, change in bowel habits. No blood in the stool. No significant GU symptoms. SKIN/HEME: ,no acute skin rashes suspicious lesions or bleeding. No lymphadenopathy, nodules, masses.  NEURO/ PSYCH:  No neurologic signs such as weakness numbness. No depression anxiety. IMM/ Allergy: No unusual infections.  Allergy .   REST of 12 system review negative except as per HPI   Past Medical History  Diagnosis Date  . FIBROIDS, UTERUS 09/03/2010  . VITAMIN D DEFICIENCY 06/02/2010  . HYPERLIPIDEMIA 08/08/2007  . OBESITY 06/24/2009  . ANEMIA, IRON DEFICIENCY 07/02/2010  . GERD 08/08/2007  . Irregular menstrual cycle 06/24/2009  . LEG PAIN, RIGHT 10/03/2010  . LEG CRAMPS 06/24/2009  . SLEEPLESSNESS 11/16/2007  . FATIGUE 06/24/2009  . TREMOR 07/02/2010  . TINGLING 10/03/2010  . COUGH, CHRONIC 04/09/2010  . ABDOMINAL PAIN RIGHT LOWER QUADRANT 10/03/2010  . 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 Alcohol abuse Brother   . Arthritis    . Hypertension      History   Social History  . Marital Status: Single    Spouse Name: N/A    Number of Children: N/A  . Years of Education: N/A   Social History Main Topics  . Smoking status: Never Smoker   . Smokeless tobacco: Never Used  . Alcohol Use: Yes     Comment: socially  . Drug Use: No  . Sexually Active: None   Other Topics Concern  . 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      40 hours  Per week.     Outpatient Encounter Prescriptions as of 01/09/2013  Medication Sig Dispense Refill  . cyclobenzaprine (FLEXERIL) 10 MG tablet TAKE 1 TABLET BY MOUTH EVERY 8 HOURS AS NEEDED FOR MUSCLE SPASMS  30 tablet  0  . ferrous sulfate 325 (65 FE) MG tablet Take 325 mg by mouth 2 (two) times daily.       Marland Kitchen ibuprofen (  ADVIL,MOTRIN) 200 MG tablet Take 200 mg by mouth every 6 (six) hours as needed.        Marland Kitchen omeprazole (PRILOSEC) 20 MG capsule Take 1 capsule (20 mg total) by mouth daily.  30 capsule  3  . propranolol ER (INDERAL LA) 60 MG 24 hr capsule Take 60 mg by mouth daily.      . Vitamin D, Cholecalciferol, 400 UNITS CHEW Chew by mouth.       . zolpidem (AMBIEN) 10 MG tablet Take 1-1 1/2 tabs by mouth at bedtime as needed for sleep.  90 tablet  0  . [DISCONTINUED] meloxicam (MOBIC) 15 MG tablet Take 1 tablet (15 mg total) by mouth daily.  30 tablet  2  . [DISCONTINUED] propranolol (INDERAL) 60 MG tablet Take 60 mg by mouth once.       No facility-administered encounter medications on file as of 01/09/2013.    EXAM:  BP 138/86  Pulse 81  Temp(Src) 98.7 F (37.1 C) (Oral)  Ht 5' 2.5" (1.588 m)  Wt 179 lb (81.194 kg)  BMI 32.2 kg/m2  SpO2 98%  Body mass index is 32.2 kg/(m^2). Wt Readings from Last 3 Encounters:  01/09/13 179  lb (81.194 kg)  12/26/12 188 lb (85.276 kg)  12/05/12 188 lb (85.276 kg)    Physical Exam: Vital signs reviewed [id number] is a well-developed well-nourished alert cooperative   female who appears her stated age in no acute distress.  HEENT: normocephalic atraumatic , Eyes: PERRL EOM's full, conjunctiva clear, Nares: paten,t no deformity discharge or tenderness., Ears: no deformity EAC's clear TMs with normal landmarks. Mouth: clear OP, no lesions, edema.  Moist mucous membranes. Dentition in adequate repair. NECK: supple without masses, thyromegaly or bruits. CHEST/PULM:  Clear to auscultation and percussion breath sounds equal no wheeze , rales or rhonchi. No chest wall deformities or tenderness. Breast scars well healed no nodules of discharge axilla clear  CV: PMI is nondisplaced, S1 S2 no gallops, murmurs, rubs. Peripheral pulses are full without delay.No JVD .  ABDOMEN: Bowel sounds normal nontender  No guard or rebound, no hepato splenomegal no CVA tenderness.  No hernia. Extremtities:  No clubbing cyanosis or edema, no acute joint swelling or redness no focal atrophy knees no redness or warmth  NEURO:  Oriented x3, cranial nerves 3-12 appear to be intact, no obvious focal weakness,gait within normal limits no abnormal reflexes or asymmetrical no ov rest tremor today .  SKIN: No acute rashes normal turgor, color, no bruising or petechiae. PSYCH: Oriented, good eye contact, no obvious depression anxiety, cognition and judgment appear normal. LN: no cervical axillary inguinal adenopathy  Lab Results  Component Value Date   WBC 6.8 12/26/2012   HGB 15.6* 12/26/2012   HCT 46.0 12/26/2012   PLT 425.0* 12/26/2012   GLUCOSE 98 12/26/2012   CHOL 181 12/26/2012   TRIG 62.0 12/26/2012   HDL 55.20 12/26/2012   LDLDIRECT 135.4 06/27/2012   LDLCALC 113* 12/26/2012   ALT 20 12/26/2012   AST 20 12/26/2012   NA 139 12/26/2012   K 4.1 12/26/2012   CL 105 12/26/2012   CREATININE 0.7 12/26/2012   BUN 8  12/26/2012   CO2 25 12/26/2012   TSH 1.28 12/26/2012   HGBA1C 5.7 06/27/2012    ASSESSMENT AND PLAN:  Discussed the following assessment and plan:  Visit for preventive health examination - utd  counseled continue exercise weight loss   Muscle spasms of head or neck - Better after her breast surgery  gets it occasionally takes an occasional Flexeril none for the past month  SLEEPLESSNESS - Currently taking 15 mg of Ambien risk-benefit explained ;consider weaning in the future. Uncertain if hormone replacement would help.  TREMOR - Right hand problematic with tasks that time on propranolol extended release per neuro no side effects reported ;still has tremors.  HYPERLIPIDEMIA - Much improved since weight loss. And exercise.  BMI 32.0-32.9,adult - counseled continued weight loss   Risk benefit of medication discussed.  continue for now .   Rov in 6 months to reassess.  Patient Care Team: Madelin Headings, MD as PCP - General Genia Del, MD (Obstetrics and Gynecology) Etter Sjogren, MD (Plastic Surgery) Melvyn Novas, MD (Neurology) Ralene Cork, DO [facility]) Patient Instructions  Continue lifestyle intervention healthy eating and exercise .  losing weight in a healthy  Manner can prevent diabetes and help your joint health.  Caution with the Remus Loffler but can continue at this time.   Ask   Dr. Algis Downs  About the tremor.   And your GYNE about considering HRT  For sleep   hrt has a risk also.   ROV in 6 months for med check if still taking ambien.   Preventive visit in a year.    Neta Mends. Panosh M.D.

## 2013-01-09 NOTE — Patient Instructions (Signed)
Continue lifestyle intervention healthy eating and exercise .  losing weight in a healthy  Manner can prevent diabetes and help your joint health.  Caution with the Remus Loffler but can continue at this time.   Ask   Dr. Algis Downs  About the tremor.   And your GYNE about considering HRT  For sleep   hrt has a risk also.   ROV in 6 months for med check if still taking ambien.   Preventive visit in a year.

## 2013-01-17 ENCOUNTER — Ambulatory Visit (INDEPENDENT_AMBULATORY_CARE_PROVIDER_SITE_OTHER): Payer: Commercial Managed Care - PPO | Admitting: Emergency Medicine

## 2013-01-17 VITALS — BP 120/75 | HR 73 | Temp 98.0°F | Resp 18 | Ht 62.25 in | Wt 181.0 lb

## 2013-01-17 DIAGNOSIS — J209 Acute bronchitis, unspecified: Secondary | ICD-10-CM

## 2013-01-17 DIAGNOSIS — L723 Sebaceous cyst: Secondary | ICD-10-CM

## 2013-01-17 DIAGNOSIS — L089 Local infection of the skin and subcutaneous tissue, unspecified: Secondary | ICD-10-CM

## 2013-01-17 MED ORDER — CLARITHROMYCIN ER 500 MG PO TB24: 1000.0000 mg | ORAL_TABLET | Freq: Every day | ORAL | Status: DC

## 2013-01-17 MED ORDER — DOXYCYCLINE HYCLATE 100 MG PO CAPS: 100.0000 mg | ORAL_CAPSULE | Freq: Two times a day (BID) | ORAL | Status: DC

## 2013-01-17 MED ORDER — HYDROCOD POLST-CHLORPHEN POLST 10-8 MG/5ML PO LQCR: 5.0000 mL | Freq: Two times a day (BID) | ORAL | Status: DC | PRN

## 2013-01-17 NOTE — Progress Notes (Signed)
[facility] [address], [place] [address] [phone]  Date:  01/17/2013   Name:  Tiffany Carson   DOB:  18-Oct-1966   MRN:  [id number]  PCP:  Lorretta Harp, MD    Chief Complaint: Nasal Congestion and Cough   History of Present Illness:  Tiffany Carson is a 47 y.o. very pleasant female patient who presents with the following:  Ill for a week with post nasal drainage and a cough productive of purulent sputum.  No shortness of breath but has some wheezing.  No hemoptysis.  No nausea or vomiting.  No rash or stool change.  Has a sore throat that is somewhat scratchy.  Has a reddened area on the right infraclavicular chest wall.  History of a prior cyst removal.   Denies other complaint or health concern today. No improvement with over the counter medications or other home remedies.   Patient Active Problem List  Diagnosis  . VITAMIN D DEFICIENCY  . OBESITY  . GERD  . SLEEPLESSNESS  . TREMOR  . TINGLING  . ACQUIRED ABSENCE OF BOTH CERVIX AND UTERUS  . Gynecomastia, female  . Muscle spasms of head or neck  . Neck pain  . Medial knee pain  . Visit for preventive health examination    Past Medical History  Diagnosis Date  . FIBROIDS, UTERUS 09/03/2010  . VITAMIN D DEFICIENCY 06/02/2010  . HYPERLIPIDEMIA 08/08/2007  . OBESITY 06/24/2009  . ANEMIA, IRON DEFICIENCY 07/02/2010  . GERD 08/08/2007  . Irregular menstrual cycle 06/24/2009  . LEG PAIN, RIGHT 10/03/2010  . LEG CRAMPS 06/24/2009  . SLEEPLESSNESS 11/16/2007  . FATIGUE 06/24/2009  . TREMOR 07/02/2010  . TINGLING 10/03/2010  . COUGH, CHRONIC 04/09/2010  . ABDOMINAL PAIN RIGHT LOWER QUADRANT 10/03/2010  . Acquired absence of both cervix and uterus 10/03/2010    Past Surgical History  Procedure Laterality Date  . Cholecystectomy    . Oophorectomy      left   salpingesctomy   . Abdominal hysterectomy  nov 2011    secondary infection  had adhesions  . Breast reduction surgery  09 20 12  .  Breast surgery      History  Substance Use Topics  . Smoking status: Never Smoker   . Smokeless tobacco: Never Used  . Alcohol Use: Yes     Comment: socially    Family History  Problem Relation Age of Onset  . Alcohol abuse Brother   . Arthritis    . Hypertension      Allergies  Allergen Reactions  . Dexamethasone     Medication list has been reviewed and updated.  Current Outpatient Prescriptions on File Prior to Visit  Medication Sig Dispense Refill  . cyclobenzaprine (FLEXERIL) 10 MG tablet TAKE 1 TABLET BY MOUTH EVERY 8 HOURS AS NEEDED FOR MUSCLE SPASMS  30 tablet  0  . ferrous sulfate 325 (65 FE) MG tablet Take 325 mg by mouth 2 (two) times daily.       Marland Kitchen ibuprofen (ADVIL,MOTRIN) 200 MG tablet Take 200 mg by mouth every 6 (six) hours as needed.        . propranolol ER (INDERAL LA) 60 MG 24 hr capsule Take 60 mg by mouth daily.      . Vitamin D, Cholecalciferol, 400 UNITS CHEW Chew by mouth.       . zolpidem (AMBIEN) 10 MG tablet Take 1-1 1/2 tabs by mouth at bedtime as needed for sleep.  90 tablet  0   No current facility-administered medications on file prior to visit.    Review of Systems:  As per HPI, otherwise negative.    Physical Examination: Filed Vitals:   01/17/13 1904  BP: 120/75  Pulse: 73  Temp: 98 F (36.7 C)  Resp: 18   Filed Vitals:   01/17/13 1904  Height: 5' 2.25" (1.581 m)  Weight: 181 lb (82.101 kg)   Body mass index is 32.85 kg/(m^2). Ideal Body Weight: Weight in (lb) to have BMI = 25: 137.5  GEN: WDWN, NAD, Non-toxic, A & O x 3 HEENT: Atraumatic, Normocephalic. Neck supple. No masses, No LAD.  Has a 1.5 cm diameter infected sebaceous cyst right base of neck Ears and Nose: No external deformity. CV: RRR, No M/G/R. No JVD. No thrill. No extra heart sounds. PULM: CTA B, no wheezes, crackles, rhonchi. No retractions. No resp. distress. No accessory muscle use. ABD: S, NT, ND, +BS. No rebound. No HSM. EXTR: No c/c/e NEURO Normal  gait.  PSYCH: Normally interactive. Conversant. Not depressed or anxious appearing.  Calm demeanor.    Assessment and Plan: Infected sebaceous cyst Doxycycline Bronchitis biaxin tussionex

## 2013-01-17 NOTE — Patient Instructions (Addendum)

## 2013-01-27 NOTE — Progress Notes (Signed)
Reviewed and agree.

## 2013-02-14 ENCOUNTER — Ambulatory Visit: Payer: Commercial Managed Care - PPO

## 2013-02-14 ENCOUNTER — Ambulatory Visit (INDEPENDENT_AMBULATORY_CARE_PROVIDER_SITE_OTHER): Payer: Commercial Managed Care - PPO | Admitting: Physician Assistant

## 2013-02-14 VITALS — BP 107/74 | HR 76 | Temp 98.0°F | Resp 18 | Ht 62.5 in | Wt 181.0 lb

## 2013-02-14 DIAGNOSIS — R059 Cough, unspecified: Secondary | ICD-10-CM

## 2013-02-14 DIAGNOSIS — R05 Cough: Secondary | ICD-10-CM

## 2013-02-14 DIAGNOSIS — J310 Chronic rhinitis: Secondary | ICD-10-CM

## 2013-02-14 MED ORDER — ALBUTEROL SULFATE HFA 108 (90 BASE) MCG/ACT IN AERS: 2.0000 | INHALATION_SPRAY | RESPIRATORY_TRACT | Status: DC | PRN

## 2013-02-14 MED ORDER — IPRATROPIUM BROMIDE 0.03 % NA SOLN: 2.0000 | Freq: Two times a day (BID) | NASAL | Status: DC

## 2013-02-14 MED ORDER — HYDROCOD POLST-CHLORPHEN POLST 10-8 MG/5ML PO LQCR: 5.0000 mL | Freq: Two times a day (BID) | ORAL | Status: DC | PRN

## 2013-02-14 NOTE — Patient Instructions (Addendum)
Your chest xray is NORMAL. You've had two antibiotics that should have adequately treated any bacteria in the lungs. It appears that your cough is due to the drainage in the back of your throat, from your sinuses. The nasal spray will help open up the sinus passages and reduce the drainage, and the cough medication can help give your lungs a break. The inhaler can help reduce the twitchiness of your lungs and reduce the cough. If you develop a fever or chills, or develop new symptoms, or if the cough becomes productive again, please contact me.

## 2013-02-14 NOTE — Progress Notes (Signed)
Subjective:    Patient ID: Tiffany Carson, female    DOB: 1966-03-16, 47 y.o.   MRN: [id number]  HPI This 47 y.o. female presents for evaluation of cough. Was seen here on 01/17/2013 and diagnosed with bronchitis.  Was prescribed Biaxin and Tussionex.  Was also treated with doxycycline for a infected sebaceous cyst of the RIGHT lower anterior neck.  Initially she seemed to improve, but then over the last week has begun to "feel crappy again."  The cough has been very perisistent and worse at night.  Mostly non-productive. In the past, has required another round of "stronger" antibiotics to get rid of it.  Feels some tightness in the chest. Mild post-nasal drainage and runny nose. No unexplained myalgias, arthralgias or rash.   Past Medical History  Diagnosis Date  . FIBROIDS, UTERUS 09/03/2010  . VITAMIN D DEFICIENCY 06/02/2010  . HYPERLIPIDEMIA 08/08/2007  . OBESITY 06/24/2009  . ANEMIA, IRON DEFICIENCY 07/02/2010  . GERD 08/08/2007  . Irregular menstrual cycle 06/24/2009  . LEG PAIN, RIGHT 10/03/2010  . LEG CRAMPS 06/24/2009  . SLEEPLESSNESS 11/16/2007  . FATIGUE 06/24/2009  . TREMOR 07/02/2010  . TINGLING 10/03/2010  . COUGH, CHRONIC 04/09/2010  . ABDOMINAL PAIN RIGHT LOWER QUADRANT 10/03/2010  . Acquired absence of both cervix and uterus 10/03/2010    Past Surgical History  Procedure Laterality Date  . Cholecystectomy    . Oophorectomy      left   salpingesctomy   . Abdominal hysterectomy  nov 2011    secondary infection  had adhesions  . Breast reduction surgery  09 20 12  . Breast surgery      Prior to Admission medications   Medication Sig Start Date End Date Taking? Authorizing Provider  cyclobenzaprine (FLEXERIL) 10 MG tablet TAKE 1 TABLET BY MOUTH EVERY 8 HOURS AS NEEDED FOR MUSCLE SPASMS 11/09/12  Yes Madelin Headings, MD  ferrous sulfate 325 (65 FE) MG tablet Take 325 mg by mouth 2 (two) times daily.    Yes Madelin Headings, MD  ibuprofen (ADVIL,MOTRIN) 200 MG tablet Take 200 mg  by mouth every 6 (six) hours as needed.     Yes Historical Provider, MD  meloxicam (MOBIC) 15 MG tablet Take 15 mg by mouth as needed for pain.   Yes Historical Provider, MD  omeprazole (PRILOSEC) 20 MG capsule Take 20 mg by mouth as needed. 10/05/11  Yes Madelin Headings, MD  propranolol ER (INDERAL LA) 60 MG 24 hr capsule Take 60 mg by mouth daily.   Yes Melvyn Novas, MD  Vitamin D, Cholecalciferol, 400 UNITS CHEW Chew by mouth.    Yes Madelin Headings, MD  zolpidem (AMBIEN) 10 MG tablet Take 1-1 1/2 tabs by mouth at bedtime as needed for sleep. 12/09/12 12/09/13 Yes Madelin Headings, MD    Allergies  Allergen Reactions  . Dexamethasone     Face turned a little bit red after taking a dose of this for poison ivy.    History   Social History  . Marital Status: Single    Spouse Name: n/a    Number of Children: 0  . Years of Education: 16   Occupational History  . NUCLEAR MEDICINE     [employer]   Social History Main Topics  . Smoking status: Never Smoker   . Smokeless tobacco: Never Used  . Alcohol Use: 0 - .5 oz/week    0-1 drink(s) per week     Comment: rarely  .  Drug Use: No  . Sexually Active: No   Other Topics Concern  . Not on file   Social History Narrative   Lives alone, with 2 dogs.   Cousins live nearby.  Siblings are in middle [place].   Family History  Problem Relation Age of Onset  . Arthritis    . Hypertension    . Diabetes Mother   . Hypertension Mother   . Diabetes Brother 34    2013, type 2     Review of Systems As above. No CP, SOB, dizziness, GI/GU symptoms    Objective:   Physical Exam Blood pressure 107/74, pulse 76, temperature 98 F (36.7 C), temperature source Oral, resp. rate 18, height 5' 2.5" (1.588 m), weight 181 lb (82.101 kg), SpO2 97.00%. Body mass index is 32.56 kg/(m^2). Well-developed, well nourished WF who is awake, alert and oriented, in NAD. HEENT: [place]/AT, PERRL, EOMI.  Sclera and conjunctiva are clear.   EAC are patent, TMs are normal in appearance except for a small effusion on the RIGHT. Nasal mucosa is congested, pink and moist. OP is clear. Neck: supple, non-tender, no lymphadenopathy, thyromegaly. Heart: RRR, no murmur Lungs: normal effort, CTA Extremities: no cyanosis, clubbing or edema. Skin: warm and dry without rash. Psychologic: good mood and appropriate affect, normal speech and behavior.  CXR: [facility] reading (PRIMARY) by  Dr. Clelia Croft.  Normal chest.  No infiltrates.  No acute pulmonary process.     Assessment & Plan:  Cough - Plan: DG Chest 2 View, chlorpheniramine-HYDROcodone (TUSSIONEX PENNKINETIC ER) 10-8 MG/5ML LQCR, albuterol (PROVENTIL HFA;VENTOLIN HFA) 108 (90 BASE) MCG/ACT inhaler  Rhinitis - Plan: ipratropium (ATROVENT) 0.03 % nasal spray  Patient Instructions  Your chest xray is NORMAL. You've had two antibiotics that should have adequately treated any bacteria in the lungs. It appears that your cough is due to the drainage in the back of your throat, from your sinuses. The nasal spray will help open up the sinus passages and reduce the drainage, and the cough medication can help give your lungs a break. The inhaler can help reduce the twitchiness of your lungs and reduce the cough. If you develop a fever or chills, or develop new symptoms, or if the cough becomes productive again, please contact me.   Fernande Bras, PA-C Physician Assistant-Certified [facility]

## 2013-03-02 ENCOUNTER — Other Ambulatory Visit: Payer: Self-pay | Admitting: Internal Medicine

## 2013-03-06 NOTE — Telephone Encounter (Signed)
Last seen: 01/09/13 Last filled: 12/09/12 #90 with 0 refills No future appointment created. Please advise.  Thanks!!

## 2013-03-08 NOTE — Telephone Encounter (Signed)
Record review refill x 1  Due for ROV in September

## 2013-03-20 ENCOUNTER — Telehealth: Payer: Self-pay | Admitting: Internal Medicine

## 2013-03-20 NOTE — Telephone Encounter (Signed)
Ok to refill 45 in a bottle  And  1 refill  For the time being.

## 2013-03-20 NOTE — Telephone Encounter (Signed)
Pt lost ambien 10 mg medication she got filled on 03-08-13. Pt was out of town and notified motel and med was not there. Pt would like a refill on medication today patient takes total of 15 mg at night. [facility]

## 2013-03-20 NOTE — Telephone Encounter (Signed)
Refilled #90.  Please advise.  Thanks!!!

## 2013-03-21 ENCOUNTER — Other Ambulatory Visit: Payer: Self-pay | Admitting: Family Medicine

## 2013-03-21 MED ORDER — ZOLPIDEM TARTRATE 10 MG PO TABS: ORAL_TABLET | ORAL | Status: DC

## 2013-03-21 NOTE — Telephone Encounter (Signed)
Called and left on pharmacy voicemail.

## 2013-03-21 NOTE — Telephone Encounter (Signed)
PT called the pharmacy, and they stated that the medication was on hold until they received a fax from our office authorizing an early refill. Please assist.

## 2013-04-17 ENCOUNTER — Other Ambulatory Visit: Payer: Self-pay

## 2013-04-17 DIAGNOSIS — Z9889 Other specified postprocedural states: Secondary | ICD-10-CM

## 2013-04-17 DIAGNOSIS — Z1231 Encounter for screening mammogram for malignant neoplasm of breast: Secondary | ICD-10-CM

## 2013-05-22 ENCOUNTER — Ambulatory Visit: Payer: 59

## 2013-06-14 ENCOUNTER — Other Ambulatory Visit: Payer: Self-pay | Admitting: Plastic Surgery

## 2013-06-18 ENCOUNTER — Ambulatory Visit (INDEPENDENT_AMBULATORY_CARE_PROVIDER_SITE_OTHER): Payer: Commercial Managed Care - PPO | Admitting: Emergency Medicine

## 2013-06-18 VITALS — BP 124/74 | HR 93 | Temp 98.3°F | Resp 18 | Ht 63.5 in | Wt 189.4 lb

## 2013-06-18 DIAGNOSIS — J018 Other acute sinusitis: Secondary | ICD-10-CM

## 2013-06-18 MED ORDER — AMOXICILLIN-POT CLAVULANATE 875-125 MG PO TABS: 1.0000 | ORAL_TABLET | Freq: Two times a day (BID) | ORAL | Status: DC

## 2013-06-18 MED ORDER — PSEUDOEPHEDRINE-GUAIFENESIN ER 60-600 MG PO TB12: 1.0000 | ORAL_TABLET | Freq: Two times a day (BID) | ORAL | Status: DC

## 2013-06-18 NOTE — Patient Instructions (Addendum)

## 2013-06-18 NOTE — Progress Notes (Signed)
[facility] [address], [place] [address] [phone]  Date:  06/18/2013   Name:  Tiffany Carson   DOB:  08/26/1966   MRN:  [id number]  PCP:  Lorretta Harp, MD    Chief Complaint: Nasal Congestion and Headache   History of Present Illness:  Tiffany Carson is a 47 y.o. very pleasant female patient who presents with the following:  Ill two weeks with mucopurulent nasal drainage and post nasal drip. Non productive cough. No wheezing or shortness of breath.  No fever or chills.  Has a headache and sinus pressure.  No improvement with over the counter medications or other home remedies. Denies other complaint or health concern today..  Fatigued and achy.  Patient Active Problem List   Diagnosis Date Noted  . Visit for preventive health examination 01/09/2013  . Medial knee pain 11/30/2012  . Gynecomastia, female 02/20/2011  . Muscle spasms of head or neck 02/20/2011  . Neck pain 02/20/2011  . TINGLING 10/03/2010  . ACQUIRED ABSENCE OF BOTH CERVIX AND UTERUS 10/03/2010  . TREMOR 07/02/2010  . VITAMIN D DEFICIENCY 06/02/2010  . OBESITY 06/24/2009  . SLEEPLESSNESS 11/16/2007  . GERD 08/08/2007    Past Medical History  Diagnosis Date  . FIBROIDS, UTERUS 09/03/2010  . VITAMIN D DEFICIENCY 06/02/2010  . HYPERLIPIDEMIA 08/08/2007  . OBESITY 06/24/2009  . ANEMIA, IRON DEFICIENCY 07/02/2010  . GERD 08/08/2007  . Irregular menstrual cycle 06/24/2009  . LEG PAIN, RIGHT 10/03/2010  . LEG CRAMPS 06/24/2009  . SLEEPLESSNESS 11/16/2007  . FATIGUE 06/24/2009  . TREMOR 07/02/2010  . TINGLING 10/03/2010  . COUGH, CHRONIC 04/09/2010  . ABDOMINAL PAIN RIGHT LOWER QUADRANT 10/03/2010  . Acquired absence of both cervix and uterus 10/03/2010    Past Surgical History  Procedure Laterality Date  . Cholecystectomy    . Oophorectomy      left   salpingesctomy   . Abdominal hysterectomy  nov 2011    secondary infection  had adhesions  . Breast reduction surgery  09  20 12  . Breast surgery      History  Substance Use Topics  . Smoking status: Never Smoker   . Smokeless tobacco: Never Used  . Alcohol Use: 0 - .5 oz/week    0-1 drink(s) per week     Comment: rarely    Family History  Problem Relation Age of Onset  . Arthritis    . Hypertension    . Diabetes Mother   . Hypertension Mother   . Diabetes Brother 52    2013, type 2    Allergies  Allergen Reactions  . Dexamethasone     Face turned a little bit red after taking a dose of this for poison ivy.    Medication list has been reviewed and updated.  Current Outpatient Prescriptions on File Prior to Visit  Medication Sig Dispense Refill  . chlorpheniramine-HYDROcodone (TUSSIONEX PENNKINETIC ER) 10-8 MG/5ML LQCR Take 5 mLs by mouth every 12 (twelve) hours as needed (cough).  140 mL  0  . cyclobenzaprine (FLEXERIL) 10 MG tablet TAKE 1 TABLET BY MOUTH EVERY 8 HOURS AS NEEDED FOR MUSCLE SPASMS  30 tablet  0  . ferrous sulfate 325 (65 FE) MG tablet Take 325 mg by mouth 2 (two) times daily.       . meloxicam (MOBIC) 15 MG tablet Take 15 mg by mouth as needed for pain.      Marland Kitchen omeprazole (PRILOSEC) 20 MG capsule Take  20 mg by mouth as needed.      . propranolol ER (INDERAL LA) 60 MG 24 hr capsule Take 60 mg by mouth daily.      . Vitamin D, Cholecalciferol, 400 UNITS CHEW Chew by mouth.       . zolpidem (AMBIEN) 10 MG tablet TAKE 1 TO 1 AND 1/2 TABLETS BY MOUTH AT BEDTIME AS NEEDED FOR SLEEP  45 tablet  1  . albuterol (PROVENTIL HFA;VENTOLIN HFA) 108 (90 BASE) MCG/ACT inhaler Inhale 2 puffs into the lungs every 4 (four) hours as needed for wheezing (cough, shortness of breath or wheezing.).  1 Inhaler  0  . ibuprofen (ADVIL,MOTRIN) 200 MG tablet Take 200 mg by mouth every 6 (six) hours as needed.        Marland Kitchen ipratropium (ATROVENT) 0.03 % nasal spray Place 2 sprays into the nose 2 (two) times daily.  30 mL  0   No current facility-administered medications on file prior to visit.    Review of  Systems:  As per HPI, otherwise negative.    Physical Examination: Filed Vitals:   06/18/13 1633  BP: 124/74  Pulse: 93  Temp: 98.3 F (36.8 C)  Resp: 18   Filed Vitals:   06/18/13 1633  Height: 5' 3.5" (1.613 m)  Weight: 189 lb 6.4 oz (85.911 kg)   Body mass index is 33.02 kg/(m^2). Ideal Body Weight: Weight in (lb) to have BMI = 25: 143.1  GEN: WDWN, NAD, Non-toxic, A & O x 3 HEENT: Atraumatic, Normocephalic. Neck supple. No masses, No LAD.  Tender maxillary and frontal sinuses Ears and Nose: No external deformity. CV: RRR, No M/G/R. No JVD. No thrill. No extra heart sounds. PULM: CTA B, no wheezes, crackles, rhonchi. No retractions. No resp. distress. No accessory muscle use. ABD: S, NT, ND, +BS. No rebound. No HSM. EXTR: No c/c/e NEURO Normal gait.  PSYCH: Normally interactive. Conversant. Not depressed or anxious appearing.  Calm demeanor.    Assessment and Plan: Pansinusitis augmentin mucinex d  Signed,  Chiles Odor, MD

## 2013-07-10 ENCOUNTER — Ambulatory Visit (INDEPENDENT_AMBULATORY_CARE_PROVIDER_SITE_OTHER): Payer: Commercial Managed Care - PPO | Admitting: *Deleted

## 2013-07-10 ENCOUNTER — Encounter: Payer: Self-pay | Admitting: *Deleted

## 2013-07-10 DIAGNOSIS — Z23 Encounter for immunization: Secondary | ICD-10-CM

## 2013-08-28 ENCOUNTER — Other Ambulatory Visit: Payer: Self-pay | Admitting: Internal Medicine

## 2013-08-29 NOTE — Telephone Encounter (Signed)
Ok to refill x 1    #45

## 2013-08-29 NOTE — Telephone Encounter (Signed)
Last filled on 03/21/13 #45 with 1 additional refill Had her CPE on 01/09/13 She has no future appt. Please advise.  Thanks!

## 2013-10-16 ENCOUNTER — Other Ambulatory Visit: Payer: Self-pay | Admitting: Sports Medicine

## 2013-10-17 ENCOUNTER — Telehealth: Payer: Self-pay | Admitting: Family Medicine

## 2013-10-17 NOTE — Telephone Encounter (Signed)
Call in #15 with no rf 

## 2013-10-17 NOTE — Telephone Encounter (Signed)
[facility] is requesting refills.   Pt had CPE on 01/09/13.  Has no future appointments. Last filled on 08/28/13 #45 Please advise.  Thanks!

## 2013-10-18 ENCOUNTER — Other Ambulatory Visit: Payer: Self-pay | Admitting: Family Medicine

## 2013-10-18 MED ORDER — ZOLPIDEM TARTRATE 10 MG PO TABS: ORAL_TABLET | ORAL | Status: DC

## 2013-10-18 NOTE — Telephone Encounter (Signed)
Called to the pharmacy and left on voicemail. 

## 2013-10-21 ENCOUNTER — Ambulatory Visit (INDEPENDENT_AMBULATORY_CARE_PROVIDER_SITE_OTHER): Payer: Commercial Managed Care - PPO | Admitting: Family Medicine

## 2013-10-21 VITALS — BP 120/72 | HR 68 | Temp 98.3°F | Resp 18 | Ht 63.5 in | Wt 187.0 lb

## 2013-10-21 DIAGNOSIS — J209 Acute bronchitis, unspecified: Secondary | ICD-10-CM

## 2013-10-21 MED ORDER — AZITHROMYCIN 250 MG PO TABS: ORAL_TABLET | ORAL | Status: DC

## 2013-10-21 MED ORDER — HYDROCODONE-HOMATROPINE 5-1.5 MG/5ML PO SYRP: 5.0000 mL | ORAL_SOLUTION | Freq: Three times a day (TID) | ORAL | Status: DC | PRN

## 2013-10-21 NOTE — Progress Notes (Signed)
This chart was scribed for Elvina Sidle, MD by Arlan Organ, ED Scribe. This patient was seen in room Room 13 and the patient's care was started 12:23 PM.    Tiffany Carson MRN: [id number], DOB: 1966/03/19, 47 y.o. Date of Encounter: 10/21/2013, 12:18 PM  Primary Physician: Lorretta Harp, MD  Chief Complaint:  Chief Complaint  Patient presents with  . Generalized Body Aches    x1 week   . Cough    yellowish drainage   . Headache  . Nasal Congestion    HPI: 47 y.o. year old female presents with a 7 day history of nasal congestion, post nasal drip, HA, sore throat, and cough. Mild sinus pressure. Nasal congestion thick and yellow. Cough is productive of green/yellow sputum and not associated with time of day. Pt says her cough sometimes keeps her from sleep. Ears feel full, leading to sensation of muffled hearing. Has tried OTC cold preps without success. No GI complaints. She denies fever or chills. Pt denies sick contacts, recent antibiotics, or recent travels. She denies leg trauma or sedentary periods. Denies h/o cancer. She denies any tobacco use.   Pt currently works for [employer]  Past Medical History  Diagnosis Date  . FIBROIDS, UTERUS 09/03/2010  . VITAMIN D DEFICIENCY 06/02/2010  . HYPERLIPIDEMIA 08/08/2007  . OBESITY 06/24/2009  . ANEMIA, IRON DEFICIENCY 07/02/2010  . GERD 08/08/2007  . Irregular menstrual cycle 06/24/2009  . LEG PAIN, RIGHT 10/03/2010  . LEG CRAMPS 06/24/2009  . SLEEPLESSNESS 11/16/2007  . FATIGUE 06/24/2009  . TREMOR 07/02/2010  . TINGLING 10/03/2010  . COUGH, CHRONIC 04/09/2010  . ABDOMINAL PAIN RIGHT LOWER QUADRANT 10/03/2010  . Acquired absence of both cervix and uterus 10/03/2010     Home Meds: Prior to Admission medications   Medication Sig Start Date End Date Taking? Authorizing Provider  albuterol (PROVENTIL HFA;VENTOLIN HFA) 108 (90 BASE) MCG/ACT inhaler Inhale 2 puffs into the lungs every 4 (four) hours as needed  for wheezing (cough, shortness of breath or wheezing.). 02/14/13  Yes Chelle S Jeffery, PA-C  ferrous sulfate 325 (65 FE) MG tablet Take 325 mg by mouth 2 (two) times daily.    Yes Madelin Headings, MD  ibuprofen (ADVIL,MOTRIN) 200 MG tablet Take 200 mg by mouth every 6 (six) hours as needed.     Yes Historical Provider, MD  meloxicam (MOBIC) 15 MG tablet TAKE 1 TABLET BY MOUTH ONCE DAILY 10/16/13  Yes Ozzie Hoyle Draper, DO  omeprazole (PRILOSEC) 20 MG capsule Take 20 mg by mouth as needed. 10/05/11  Yes Madelin Headings, MD  propranolol ER (INDERAL LA) 60 MG 24 hr capsule Take 60 mg by mouth daily.   Yes Melvyn Novas, MD  Vitamin D, Cholecalciferol, 400 UNITS CHEW Chew by mouth.    Yes Madelin Headings, MD  zolpidem (AMBIEN) 10 MG tablet TAKE 1 TO 1-1/2 TABLETS BY MOUTH AT BEDTIME AS NEEDED FOR SLEEP 10/18/13  Yes Nelwyn Salisbury, MD  amoxicillin-clavulanate (AUGMENTIN) 875-125 MG per tablet Take 1 tablet by mouth 2 (two) times daily. 06/18/13   Barrantes Odor, MD  chlorpheniramine-HYDROcodone (TUSSIONEX PENNKINETIC ER) 10-8 MG/5ML LQCR Take 5 mLs by mouth every 12 (twelve) hours as needed (cough). 02/14/13   Chelle S Jeffery, PA-C  cyclobenzaprine (FLEXERIL) 10 MG tablet TAKE 1 TABLET BY MOUTH EVERY 8 HOURS AS NEEDED FOR MUSCLE SPASMS 11/09/12   Madelin Headings, MD  ipratropium (ATROVENT) 0.03 % nasal spray Place 2 sprays into the nose  2 (two) times daily. 02/14/13   Chelle S Jeffery, PA-C  pseudoephedrine-guaifenesin (MUCINEX D) 60-600 MG per tablet Take 1 tablet by mouth every 12 (twelve) hours. 06/18/13 06/18/14  [place] Odor, MD    Allergies:  Allergies  Allergen Reactions  . Dexamethasone     Face turned a little bit red after taking a dose of this for poison ivy.    History   Social History  . Marital Status: Single    Spouse Name: n/a    Number of Children: 0  . Years of Education: 16   Occupational History  . NUCLEAR MEDICINE     [employer]   Social History Main  Topics  . Smoking status: Never Smoker   . Smokeless tobacco: Never Used  . Alcohol Use: 0 - .5 oz/week    0-1 drink(s) per week     Comment: rarely  . Drug Use: No  . Sexual Activity: No   Other Topics Concern  . Not on file   Social History Narrative   Lives alone, with 2 dogs.   Cousins live nearby.  Siblings are in middle [place].   Neg td  ocass etoh 1 caffeine per day    40 hours  Per week.      Review of Systems: Constitutional: negative for chills, fever, night sweats or weight changes Cardiovascular: negative for chest pain or palpitations Respiratory: negative for hemoptysis, wheezing, or shortness of breath Abdominal: negative for abdominal pain, nausea, vomiting or diarrhea Dermatological: negative for rash Neurologic: negative for headache   Physical Exam: Blood pressure 120/72, pulse 68, temperature 98.3 F (36.8 C), temperature source Oral, resp. rate 18, height 5' 3.5" (1.613 m), weight 187 lb (84.823 kg), SpO2 97.00%., Body mass index is 32.6 kg/(m^2). General: Well developed, well nourished, in no acute distress. Head: Normocephalic, atraumatic, eyes without discharge, sclera non-icteric, nares are congested. Bilateral auditory canals clear, TM's are without perforation, pearly grey with reflective cone of light bilaterally. No sinus TTP. Oral cavity moist, dentition normal. Posterior pharynx with post nasal drip and mild erythema. No peritonsillar abscess or tonsillar exudate. Neck: Supple. No thyromegaly. Full ROM. No lymphadenopathy. Lungs: Coarse breath sounds bilaterally without wheezes, rales, or rhonchi. Breathing is unlabored.  Heart: RRR with S1 S2. No murmurs, rubs, or gallops appreciated. Msk:  Strength and tone normal for age. Extremities: No clubbing or cyanosis. No edema. Neuro: Alert and oriented X 3. Moves all extremities spontaneously. CNII-XII grossly in tact. Psych:  Responds to questions appropriately with a normal affect.     ASSESSMENT AND PLAN:  47 y.o. year old female with bronchitis. -Acute bronchitis - Plan: azithromycin (ZITHROMAX Z-PAK) 250 MG tablet, HYDROcodone-homatropine (HYCODAN) 5-1.5 MG/5ML syrup   -Tylenol/Motrin prn -Rest/fluids -RTC precautions -RTC 3-5 days if no improvement  Signed, Elvina Sidle, MD 10/21/2013 12:18 PM

## 2013-10-21 NOTE — Patient Instructions (Signed)

## 2013-10-23 ENCOUNTER — Other Ambulatory Visit: Payer: Self-pay | Admitting: Family Medicine

## 2013-10-23 ENCOUNTER — Telehealth: Payer: Self-pay | Admitting: Internal Medicine

## 2013-10-23 MED ORDER — ZOLPIDEM TARTRATE 10 MG PO TABS: ORAL_TABLET | ORAL | Status: DC

## 2013-10-23 NOTE — Telephone Encounter (Signed)
Patient is over due for med check  See last note (  Due in September.)   Ok to rx 30 pills of ambien   No further refills until  Ov for med check

## 2013-10-23 NOTE — Telephone Encounter (Signed)
Called to the pharmacy and gave to [facility] the pharmacist.

## 2013-10-23 NOTE — Telephone Encounter (Signed)
Please schedule the pt per [facility].  Thanks!

## 2013-10-23 NOTE — Telephone Encounter (Signed)
#  15 given by Dr. Clent Ridges while you were out.  Please advise.  Thanks!

## 2013-10-23 NOTE — Telephone Encounter (Signed)
Pt was gave  rx only 15 ambien pills. Pt would like 30 more ambien pills call into [place] outpt phar. Pt would like to pick up med today.pt is going out of town

## 2013-10-24 NOTE — Telephone Encounter (Signed)
Pt is sch for 12-04-13

## 2013-11-03 ENCOUNTER — Telehealth: Payer: Self-pay

## 2013-11-03 NOTE — Telephone Encounter (Signed)
She was here on 10/21/13 Dr L note indicates RTC 3-5 days if no improvement If she is still coughing she should return to clinic. She can try Mucinex and see if this is helpful. She indicates she will return.

## 2013-11-03 NOTE — Telephone Encounter (Signed)
Pt states that she still has a cough at night and would like to know if she could get a refill on hydrocodone. Best# [phone] [facility] long outpatient pharmacy

## 2013-12-01 ENCOUNTER — Encounter: Payer: Self-pay | Admitting: *Deleted

## 2013-12-04 ENCOUNTER — Encounter: Payer: Self-pay | Admitting: Internal Medicine

## 2013-12-04 ENCOUNTER — Ambulatory Visit (INDEPENDENT_AMBULATORY_CARE_PROVIDER_SITE_OTHER): Payer: 59 | Admitting: Internal Medicine

## 2013-12-04 VITALS — BP 126/86 | Temp 98.9°F | Ht 62.25 in | Wt 187.0 lb

## 2013-12-04 DIAGNOSIS — M79641 Pain in right hand: Secondary | ICD-10-CM

## 2013-12-04 DIAGNOSIS — M79609 Pain in unspecified limb: Secondary | ICD-10-CM

## 2013-12-04 DIAGNOSIS — G4700 Insomnia, unspecified: Secondary | ICD-10-CM

## 2013-12-04 DIAGNOSIS — R259 Unspecified abnormal involuntary movements: Secondary | ICD-10-CM

## 2013-12-04 MED ORDER — ZOLPIDEM TARTRATE 10 MG PO TABS: ORAL_TABLET | ORAL | Status: DC

## 2013-12-04 NOTE — Patient Instructions (Addendum)
Will arrange hand eval for  Persistent pain  Arthritis tendinitis vs cts etc. Will re refer to dr Maureen Chatters  about tremor and sleep.   i agree that   Tiffany Carson is not the long term solution for sleep.  F/U  After neuro evaluation. Or 3-4 months

## 2013-12-04 NOTE — Progress Notes (Signed)
Chief Complaint  Patient presents with  . Follow-up    sleep  med     HPI: Fu medication management.  Last ov with me was 3 14  Asking for refill of Sleep and meds  Interim hx   Has been to [facility] x 4 for resp  Acute sx  Better   Was at neurologist  Over a year ago and no fu scheduled but ? If  Menopausal causing  Adding to sleep issues sx. Having to take 15 mg generic ambien for some relief . Suggested  Check with her gyne about advice of hrt   Right  Tremor and feels medication Not working that well  Inderal not that great for sx suppression   Also right  hand sx.  Pain hard to work is right handed  I masks about cts but no really weak and numb  Fall asleep.  Is the issue pretty much using  ambien every night at the 15 mg generic level  ROS: See pertinent positives and negatives per HPI. No cp sob   Past Medical History  Diagnosis Date  . FIBROIDS, UTERUS 09/03/2010  . VITAMIN D DEFICIENCY 06/02/2010  . HYPERLIPIDEMIA 08/08/2007  . OBESITY 06/24/2009  . ANEMIA, IRON DEFICIENCY 07/02/2010  . GERD 08/08/2007  . Irregular menstrual cycle 06/24/2009  . LEG PAIN, RIGHT 10/03/2010  . LEG CRAMPS 06/24/2009  . SLEEPLESSNESS 11/16/2007  . FATIGUE 06/24/2009  . TREMOR 07/02/2010  . TINGLING 10/03/2010  . COUGH, CHRONIC 04/09/2010  . ABDOMINAL PAIN RIGHT LOWER QUADRANT 10/03/2010  . Acquired absence of both cervix and uterus 10/03/2010    Family History  Problem Relation Age of Onset  . Arthritis    . Hypertension    . Diabetes Mother   . Hypertension Mother   . Diabetes Brother 52    2013, type 2    History   Social History  . Marital Status: Single    Spouse Name: n/a    Number of Children: 0  . Years of Education: 16   Occupational History  . NUCLEAR MEDICINE     [employer]   Social History Main Topics  . Smoking status: Never Smoker   . Smokeless tobacco: Never Used  . Alcohol Use: 0 - .5 oz/week    0-1 drink(s) per week     Comment: rarely  . Drug  Use: No  . Sexual Activity: No   Other Topics Concern  . None   Social History Narrative   Lives alone, with 2 dogs.   Cousins live nearby.  Siblings are in middle [place].   Neg td  ocass etoh 1 caffeine per day    40 hours  Per week.     Outpatient Encounter Prescriptions as of 12/04/2013  Medication Sig  . cyclobenzaprine (FLEXERIL) 10 MG tablet TAKE 1 TABLET BY MOUTH EVERY 8 HOURS AS NEEDED FOR MUSCLE SPASMS  . ibuprofen (ADVIL,MOTRIN) 200 MG tablet Take 200 mg by mouth every 6 (six) hours as needed.    Marland Kitchen ipratropium (ATROVENT) 0.03 % nasal spray Place 2 sprays into the nose 2 (two) times daily.  . meloxicam (MOBIC) 15 MG tablet TAKE 1 TABLET BY MOUTH ONCE DAILY  . omeprazole (PRILOSEC) 20 MG capsule Take 20 mg by mouth as needed.  . propranolol ER (INDERAL LA) 60 MG 24 hr capsule Take 60 mg by mouth daily.  . Vitamin D, Cholecalciferol, 400 UNITS CHEW Chew by mouth.   . zolpidem (AMBIEN) 10 MG  tablet TAKE 1 TO 1-1/2 TABLETS BY MOUTH AT BEDTIME AS NEEDED FOR SLEEP  . [DISCONTINUED] pseudoephedrine-guaifenesin (MUCINEX D) 60-600 MG per tablet Take 1 tablet by mouth every 12 (twelve) hours.  . [DISCONTINUED] zolpidem (AMBIEN) 10 MG tablet TAKE 1 TO 1-1/2 TABLETS BY MOUTH AT BEDTIME AS NEEDED FOR SLEEP  . [DISCONTINUED] albuterol (PROVENTIL HFA;VENTOLIN HFA) 108 (90 BASE) MCG/ACT inhaler Inhale 2 puffs into the lungs every 4 (four) hours as needed for wheezing (cough, shortness of breath or wheezing.).  . [DISCONTINUED] amoxicillin-clavulanate (AUGMENTIN) 875-125 MG per tablet Take 1 tablet by mouth 2 (two) times daily.  . [DISCONTINUED] azithromycin (ZITHROMAX Z-PAK) 250 MG tablet Take as directed on pack  . [DISCONTINUED] chlorpheniramine-HYDROcodone (TUSSIONEX PENNKINETIC ER) 10-8 MG/5ML LQCR Take 5 mLs by mouth every 12 (twelve) hours as needed (cough).  . [DISCONTINUED] ferrous sulfate 325 (65 FE) MG tablet Take 325 mg by mouth 2 (two) times daily.   . [DISCONTINUED]  HYDROcodone-homatropine (HYCODAN) 5-1.5 MG/5ML syrup Take 5 mLs by mouth every 8 (eight) hours as needed for cough.    EXAM:  BP 126/86  Temp(Src) 98.9 F (37.2 C) (Oral)  Ht 5' 2.25" (1.581 m)  Wt 187 lb (84.823 kg)  BMI 33.94 kg/m2  Body mass index is 33.94 kg/(m^2).  GENERAL: vitals reviewed and listed above, alert, oriented, appears well hydrated and in no acute distress HEENT: atraumatic, conjunctiva  clear, no obvious abnormalities on inspection of external nose and ears  NECK: no obvious masses on inspection MS: moves all extremities without noticeable focal  Abnormality ganglion right wrist  Nl rom no crepitus or weakness  Tender along thumb tendons  PSYCH: pleasant and cooperative, no obvious depression or anxiety  ASSESSMENT AND PLAN:  Discussed the following assessment and plan:  SLEEPLESSNESS - using ambien higher dose regshe agrees would like to get off if possible ; use other methods ;continue for now get Dr D to help opine consider perimencontributi - Plan: Ambulatory referral to Neurology  Hand pain, right - soft tissue vs  other dominant hand ;tremor also - Plan: Ambulatory referral to Hand Surgery, Ambulatory referral to Neurology  TREMOR - felt to be beningn problematic still get back with neuro  prescribing meds - Plan: Ambulatory referral to Neurology  -Patient advised to return or notify health care team  if symptoms worsen or persist or new concerns arise.  Patient Instructions  Will arrange hand eval for  Persistent pain  Arthritis tendinitis vs cts etc. Will re refer to dr Maureen Chatters  about tremor and sleep.   i agree that   Lorrin Mais is not the long term solution for sleep.  F/U  After neuro evaluation. Or 3-4 months    Standley Brooking. Tine Mabee M.D.  Pre visit review using our clinic review tool, if applicable. No additional management support is needed unless otherwise documented below in the visit note. Total visit 53mins > 50% spent counseling and  coordinating care

## 2013-12-18 ENCOUNTER — Institutional Professional Consult (permissible substitution): Payer: Commercial Managed Care - PPO | Admitting: Neurology

## 2013-12-25 ENCOUNTER — Institutional Professional Consult (permissible substitution): Payer: Commercial Managed Care - PPO | Admitting: Neurology

## 2014-02-19 ENCOUNTER — Other Ambulatory Visit (HOSPITAL_COMMUNITY): Payer: Self-pay | Admitting: Orthopedic Surgery

## 2014-02-19 ENCOUNTER — Encounter: Payer: Self-pay | Admitting: Neurology

## 2014-02-19 ENCOUNTER — Ambulatory Visit (INDEPENDENT_AMBULATORY_CARE_PROVIDER_SITE_OTHER): Payer: Commercial Managed Care - PPO | Admitting: Neurology

## 2014-02-19 VITALS — BP 108/76 | HR 80 | Resp 17 | Ht 63.5 in | Wt 190.0 lb

## 2014-02-19 DIAGNOSIS — R259 Unspecified abnormal involuntary movements: Secondary | ICD-10-CM

## 2014-02-19 DIAGNOSIS — G2589 Other specified extrapyramidal and movement disorders: Secondary | ICD-10-CM

## 2014-02-19 DIAGNOSIS — M542 Cervicalgia: Secondary | ICD-10-CM

## 2014-02-19 DIAGNOSIS — G4700 Insomnia, unspecified: Secondary | ICD-10-CM

## 2014-02-19 DIAGNOSIS — G249 Dystonia, unspecified: Secondary | ICD-10-CM | POA: Insufficient documentation

## 2014-02-19 DIAGNOSIS — M25539 Pain in unspecified wrist: Secondary | ICD-10-CM

## 2014-02-19 DIAGNOSIS — E669 Obesity, unspecified: Secondary | ICD-10-CM

## 2014-02-19 DIAGNOSIS — R0609 Other forms of dyspnea: Secondary | ICD-10-CM

## 2014-02-19 DIAGNOSIS — F5104 Psychophysiologic insomnia: Secondary | ICD-10-CM | POA: Insufficient documentation

## 2014-02-19 DIAGNOSIS — R0683 Snoring: Secondary | ICD-10-CM

## 2014-02-19 DIAGNOSIS — G248 Other dystonia: Secondary | ICD-10-CM | POA: Insufficient documentation

## 2014-02-19 DIAGNOSIS — R0989 Other specified symptoms and signs involving the circulatory and respiratory systems: Secondary | ICD-10-CM

## 2014-02-19 MED ORDER — SUVOREXANT 15 MG PO TABS: 15.0000 mg | ORAL_TABLET | ORAL | Status: DC

## 2014-02-19 MED ORDER — CARBIDOPA-LEVODOPA 25-100 MG PO TABS: 1.0000 | ORAL_TABLET | Freq: Three times a day (TID) | ORAL | Status: DC

## 2014-02-19 NOTE — Patient Instructions (Signed)
Dystonias  The dystonias are movement disorders in which sustained muscle contractions cause twisting and repetitive movements or abnormal postures. The movements, which are involuntary and sometimes painful, may affect a single muscle; a group of muscles such as those in the arms, legs, or neck; or the entire body. Early symptoms (problems) may include a deterioration in handwriting after writing several lines, foot cramps, and a tendency of one foot to pull up or drag after running or walking some distance. Other possible symptoms are tremor and voice or speech difficulties. Birth injury (particularly due to lack of oxygen), certain infections, reactions to certain drugs, heavy-metal or carbon monoxide poisoning, trauma (damage caused by an accident), or stroke can cause dystonic symptoms. About half the cases of dystonia have no connection to disease or injury and are called primary or idiopathic dystonia. Of the primary dystonias, many cases appear to be inherited in a dominant manner. Dystonias can also be symptoms of other diseases, some of which may be hereditary (passed down from parents). In some individuals, symptoms of a dystonia appear spontaneously in childhood between the ages of 5 and 16, usually in the foot or in the hand. For other individuals, the symptoms emerge in late adolescence or early adulthood.  TREATMENT   No one treatment has been found universally effective for dystonia. Instead, physicians use a variety of therapies (medications, surgery and other treatments such as physical therapy, splinting, stress management, and biofeedback), aimed at reducing or eliminating muscle spasms and pain. Since response to drugs varies among patients and even in the same person over time, the therapy must be individualized.  PROGNOSIS  The initial symptoms can be very mild and may be noticeable only after prolonged exertion, stress, or fatigue. Over a period of time, the symptoms may become more  noticeable and widespread and be unrelenting; sometimes, however, there is little or no progression.  RESEARCH BEING DONE  Investigators believe that the dystonias result from an abnormality in an area of the brain called the basal ganglia, where some of the messages that initiate muscle contractions are processed. Scientists suspect a defect in the body's ability to process a group of chemicals called neurotransmitters that help cells in the brain communicate with each other. Scientists at the [facility] laboratories have conducted detailed investigations of the pattern of muscle activity in persons with dystonias. Studies using EEG analysis and neuroimaging are probing brain activity. The search for the gene or genes responsible for some forms of dominantly inherited dystonias continues. In 1989, a team of researchers mapped a gene for early-onset torsion dystonia to chromosome 9; the gene was subsequently named DYT1. In 1997, the team sequenced the DYT1 gene and found that it codes for a previously unknown protein now called "torsin A."  Document Released: 10/09/2002 Document Revised: 01/11/2012 Document Reviewed: 10/19/2005  ExitCare® Patient Information ©2014 [employer].

## 2014-02-19 NOTE — Progress Notes (Signed)
[facility]  Provider:  Larey Carson, M D  Referring Provider: Burnis Medin, MD Primary Care Physician:  Tiffany Dawson, MD  Chief Complaint  Patient presents with  . New Evaluation    Room 10  . Sleep consult    HPI:  Tiffany Carson is a 48 y.o. female  Is seen here as a  revisit  from Dr. Regis Carson for Tremor and chronic Insomnia.   Tiffany Carson had been seen in my sleep clinic in 2013, before we changed to the Epic system, and had been diagnosed with an essential tremor and compliant of Insomnia.  The patient had hot flushes at night , when originally seen. She had undergone a hysterectomy in 2011. She used Ambien , first at 5 mg , than 10 mg and meanwhile at 15 mg. She is interested in a change in treatment. Her insomia started in early 2000, and in 2009 she became increasingly fatigued , was diagnosed with iron deficiency and vit D deficiency. Those had been supplemented by her PCP, but her complaint remained that of insomnia, not longer fatigue. Her problem is sleep initiation.  Insomnia causes her to be less productive . Her sleep is not restorative or restful, she feels not groggy, but had sleep eating and sleep talking on Ambien.   The patient endorsed on 05-09-12 in a sleep consultation a fatigue severity of 16 points, an Epworth sleepiness score of 11 points. She had nausea for history, works normally 7-5 , 4  10 hour days weekly , and drinks about one cup of coffee a day.  Her sleep habits are described as going to bed around 11 PM and it'll still take her a while to 40-45 minutes on Ambien to initiate sleep. She wakes up at 5:30 spontaneously right before her alarm. She estimates her nocturnal sleep time between 5 and 6 hours. Much is not aware of waking up in the middle of the night but in the past (on Ambien) she has had long phone and text conversations at night - without any recall. She takes Ambien at 10 PM. No nocturia,  She sometimes wakes up from  her own snoring, doesn't share her bedroom , there is not witness.  She has a dry mouth in the morning. Rarely will she take a nap ( 5 -6 times a year ) . We will discuss today bio feedback and meditation for insomnia, as well as BELSOMRA.    The presumed essential tremor had been treated with a betablocker, but is not responding to the current dose. She has had no increase in amplitude or frequency. Her tremor affects her handwriting.  The tremor remains right hand dominant.           Review of Systems: Out of a complete 14 system review, the patient complains of only the following symptoms, and all other reviewed systems are negative. epworth 5 , FSS 14. GDS 2 points.    History   Social History  . Marital Status: Single    Spouse Name: n/a    Number of Children: 0  . Years of Education: 16   Occupational History  . NUCLEAR MEDICINE     [employer]   Social History Main Topics  . Smoking status: Never Smoker   . Smokeless tobacco: Never Used  . Alcohol Use: 0.0 - 0.5 oz/week    0-1 drink(s) per week     Comment: rarely  . Drug Use: No  .  Sexual Activity: No   Other Topics Concern  . Not on file   Social History Narrative   Lives alone, with 2 dogs.   Cousins live nearby.  Siblings are in middle [place].   Neg td  ocass etoh 1 caffeine per day    40 hours  Per week.    Patient is right-handed.   Patient has a Haematologist.          Family History  Problem Relation Age of Onset  . Arthritis    . Hypertension    . Diabetes Mother   . Hypertension Mother   . Diabetes Brother 52    2013, type 2    Past Medical History  Diagnosis Date  . FIBROIDS, UTERUS 09/03/2010  . VITAMIN D DEFICIENCY 06/02/2010  . HYPERLIPIDEMIA 08/08/2007  . OBESITY 06/24/2009  . ANEMIA, IRON DEFICIENCY 07/02/2010  . GERD 08/08/2007  . Irregular menstrual cycle 06/24/2009  . LEG PAIN, RIGHT 10/03/2010  . LEG CRAMPS 06/24/2009  . SLEEPLESSNESS 11/16/2007  .  FATIGUE 06/24/2009  . TREMOR 07/02/2010  . TINGLING 10/03/2010  . COUGH, CHRONIC 04/09/2010  . ABDOMINAL PAIN RIGHT LOWER QUADRANT 10/03/2010  . Acquired absence of both cervix and uterus 10/03/2010    Past Surgical History  Procedure Laterality Date  . Cholecystectomy    . Oophorectomy      left   salpingesctomy   . Abdominal hysterectomy  nov 2011    secondary infection  had adhesions  . Breast reduction surgery  09 20 12  . Breast surgery      Current Outpatient Prescriptions  Medication Sig Dispense Refill  . ibuprofen (ADVIL,MOTRIN) 200 MG tablet Take 200 mg by mouth every 6 (six) hours as needed.        . meloxicam (MOBIC) 15 MG tablet TAKE 1 TABLET BY MOUTH ONCE DAILY  30 tablet  2  . omeprazole (PRILOSEC) 20 MG capsule Take 20 mg by mouth as needed.      . propranolol ER (INDERAL LA) 60 MG 24 hr capsule Take 60 mg by mouth daily.      . Vitamin D, Cholecalciferol, 400 UNITS CHEW Chew by mouth.       . zolpidem (AMBIEN) 10 MG tablet TAKE 1 TO 1-1/2 TABLETS BY MOUTH AT BEDTIME AS NEEDED FOR SLEEP  45 tablet  2   No current facility-administered medications for this visit.    Allergies as of 02/19/2014 - Review Complete 02/19/2014  Allergen Reaction Noted  . Dexamethasone      Vitals: BP 108/76  Pulse 80  Resp 17  Ht 5' 3.5" (1.613 m)  Wt 190 lb (86.183 kg)  BMI 33.12 kg/m2 Last Weight:  Wt Readings from Last 1 Encounters:  02/19/14 190 lb (86.183 kg)   Last Height:   Ht Readings from Last 1 Encounters:  02/19/14 5' 3.5" (1.613 m)    Physical exam:  General: The patient is awake, alert and appears not in acute distress. The patient is well groomed. Head: Normocephalic, atraumatic. Neck is supple. Mallampati 2 with a small opening, lateral crowding. Neck circumference: 15.75 inches. Thick neck , no nasal obstruction. No TMJ . Crowded small jaw, dental status is good.  Cardiovascular:  Regular rate and rhythm , without  murmurs or carotid bruit, and without  distended neck veins. Respiratory: Lungs are clear to auscultation. Skin:  Without evidence of edema, or rash Trunk: BMI is  elevated and patient  has normal posture. BMI increased for 30 to  33 since last visit.   Neurologic exam : The patient is awake and alert, oriented to place and time.  Memory subjective  described as intact.  There is a normal attention span & concentration ability.  Speech is fluent without  dysarthria, dysphonia or aphasia.  Mood and affect are appropriate.  Cranial nerves: Pupils are equal and briskly reactive to light. Funduscopic exam without  evidence of pallor or edema.  Extraocular movements  in vertical and horizontal planes intact and without nystagmus. Visual fields by finger perimetry are intact. Hearing to finger rub intact.  Facial sensation intact to fine touch. Facial motor strength is symmetric and tongue and uvula move midline.  Motor exam:   Normal tone and muscle bulk and symmetric strength in all extremities.  Sensory:  Fine touch, pinprick and vibration were tested in all extremities. Proprioception is normal.  Coordination: Rapid alternating movements in the fingers/hands is  with evidence of dysmetria, not ataxia and there is not tremor.    Gait and station: Patient walks without assistive device and is able and assisted stool climb up to the exam table. Strength within normal limits.  Stance is stable and normal. Tandem gait is intact  , turns  are unfragmented. Romberg testing is normal.  Deep tendon reflexes: in the  upper and lower extremities are symmetric and intact. Babinski maneuver response is  downgoing.   Assessment:  After physical and neurologic examination, review of laboratory studies, imaging, neurophysiology testing and pre-existing records, assessment is  1) concerned about sleep apnea, patient never had the EMG/NCS or sleep test set up after her last visit ( consult). SPLIT study ordered. She has further gained weight, and  now wakes with choking , dry mouth.   2) tremor still stable, after 2 years, doubt Parkinson's Disease. Her tremor is not a resting but an action tremor, and she has trouble writing or drawing. Perpendicular pen stabilization shows onset of a hand spas.  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 Trial of sinemet to see if her movements are dopamine responsive.   There hasn't been any structural insult - she was seen by Dr. Amedeo Plenty today.       Plan:  Treatment plan and additional workup :  SPLIT test , AHI 20 , score 4%. 300 mg sinemet. PO, divided 3.

## 2014-02-20 ENCOUNTER — Other Ambulatory Visit: Payer: Self-pay

## 2014-02-20 DIAGNOSIS — Z9889 Other specified postprocedural states: Secondary | ICD-10-CM

## 2014-02-20 DIAGNOSIS — Z1231 Encounter for screening mammogram for malignant neoplasm of breast: Secondary | ICD-10-CM

## 2014-02-22 ENCOUNTER — Ambulatory Visit (INDEPENDENT_AMBULATORY_CARE_PROVIDER_SITE_OTHER): Payer: Commercial Managed Care - PPO | Admitting: Family Medicine

## 2014-02-22 VITALS — BP 118/70 | HR 75 | Temp 98.2°F | Resp 16 | Ht 62.5 in | Wt 190.0 lb

## 2014-02-22 DIAGNOSIS — R059 Cough, unspecified: Secondary | ICD-10-CM

## 2014-02-22 DIAGNOSIS — R05 Cough: Secondary | ICD-10-CM

## 2014-02-22 DIAGNOSIS — H9209 Otalgia, unspecified ear: Secondary | ICD-10-CM

## 2014-02-22 DIAGNOSIS — J019 Acute sinusitis, unspecified: Secondary | ICD-10-CM

## 2014-02-22 MED ORDER — HYDROCODONE-HOMATROPINE 5-1.5 MG/5ML PO SYRP: 5.0000 mL | ORAL_SOLUTION | Freq: Every evening | ORAL | Status: DC | PRN

## 2014-02-22 MED ORDER — BENZONATATE 100 MG PO CAPS: 200.0000 mg | ORAL_CAPSULE | Freq: Two times a day (BID) | ORAL | Status: DC | PRN

## 2014-02-22 MED ORDER — AMOXICILLIN-POT CLAVULANATE 875-125 MG PO TABS: 1.0000 | ORAL_TABLET | Freq: Two times a day (BID) | ORAL | Status: DC

## 2014-02-22 NOTE — Patient Instructions (Signed)

## 2014-02-22 NOTE — Progress Notes (Signed)
Chief Complaint:  Chief Complaint  Patient presents with  . sinus congestion    HPI: Tiffany Carson is a 48 y.o. female who is here for  2 week history of nasal congetsion, adn feels like it is going down her chest. No allergies or ashtma history  Has taken generic sudafed, mucinex without relief. Might have had chills. Has had left ear pain, both ears are full. Has had facial pain. Has been coughing and can't sleep.  She has had a history of bronchitis and does not want that. She denies SOB, CP, wheezing. Minimal sore throat. Productive yellow cough.   Past Medical History  Diagnosis Date  . FIBROIDS, UTERUS 09/03/2010  . VITAMIN D DEFICIENCY 06/02/2010  . HYPERLIPIDEMIA 08/08/2007  . OBESITY 06/24/2009  . ANEMIA, IRON DEFICIENCY 07/02/2010  . GERD 08/08/2007  . Irregular menstrual cycle 06/24/2009  . LEG PAIN, RIGHT 10/03/2010  . LEG CRAMPS 06/24/2009  . SLEEPLESSNESS 11/16/2007  . FATIGUE 06/24/2009  . TREMOR 07/02/2010  . TINGLING 10/03/2010  . COUGH, CHRONIC 04/09/2010  . ABDOMINAL PAIN RIGHT LOWER QUADRANT 10/03/2010  . Acquired absence of both cervix and uterus 10/03/2010   Past Surgical History  Procedure Laterality Date  . Cholecystectomy    . Oophorectomy      left   salpingesctomy   . Abdominal hysterectomy  nov 2011    secondary infection  had adhesions  . Breast reduction surgery  09 20 12  . Breast surgery     History   Social History  . Marital Status: Single    Spouse Name: n/a    Number of Children: 0  . Years of Education: 16   Occupational History  . NUCLEAR MEDICINE     [employer]   Social History Main Topics  . Smoking status: Never Smoker   . Smokeless tobacco: Never Used  . Alcohol Use: 0.0 - 0.5 oz/week    0-1 drink(s) per week     Comment: rarely  . Drug Use: No  . Sexual Activity: No   Other Topics Concern  . None   Social History Narrative   Lives alone, with 2 dogs.   Cousins live nearby.  Siblings are in  middle [place].   Neg td  ocass etoh 1 caffeine per day    40 hours  Per week.    Patient is right-handed.   Patient has a Haematologist.         Family History  Problem Relation Age of Onset  . Arthritis    . Hypertension    . Diabetes Mother   . Hypertension Mother   . Diabetes Brother 52    2013, type 2   Allergies  Allergen Reactions  . Dexamethasone     Face turned a little bit red after taking a dose of this for poison ivy.   Prior to Admission medications   Medication Sig Start Date End Date Taking? Authorizing Provider  carbidopa-levodopa (SINEMET) 25-100 MG per tablet Take 1 tablet by mouth 3 (three) times daily. 02/19/14  Yes Carmen Dohmeier, MD  ibuprofen (ADVIL,MOTRIN) 200 MG tablet Take 200 mg by mouth every 6 (six) hours as needed.     Yes Historical Provider, MD  meloxicam (MOBIC) 15 MG tablet TAKE 1 TABLET BY MOUTH ONCE DAILY 10/16/13  Yes Carlos Levering Draper, DO  omeprazole (PRILOSEC) 20 MG capsule Take 20 mg by mouth as needed. 10/05/11  Yes Burnis Medin, MD  Suvorexant (BELSOMRA) 15 MG TABS Take 15 mg by mouth as directed. Coupon for 10 free tabs. 02/19/14  Yes Larey Seat, MD  Vitamin D, Cholecalciferol, 400 UNITS CHEW Chew by mouth.    Yes Burnis Medin, MD     ROS: The patient denies  night sweats, unintentional weight loss, chest pain, palpitations, wheezing, dyspnea on exertion, nausea, vomiting, abdominal pain, dysuria, hematuria, melena, numbness, weakness, or tingling.   All other systems have been reviewed and were otherwise negative with the exception of those mentioned in the HPI and as above.    PHYSICAL EXAM: Filed Vitals:   02/22/14 1838  BP: 118/70  Pulse: 75  Temp: 98.2 F (36.8 C)  Resp: 16  Spo2 96% Filed Vitals:   02/22/14 1838  Height: 5' 2.5" (1.588 m)  Weight: 190 lb (86.183 kg)   Body mass index is 34.18 kg/(m^2).  General: Alert, no acute distress HEENT:  Normocephalic, atraumatic, oropharynx patent. EOMI, PERRLA.  TM normal. + sinus tenderness.  Cardiovascular:  Regular rate and rhythm, no rubs murmurs or gallops.  No Carotid bruits, radial pulse intact. No pedal edema.  Respiratory: Clear to auscultation bilaterally.  No wheezes, rales, or rhonchi.  No cyanosis, no use of accessory musculature GI: No organomegaly, abdomen is soft and non-tender, positive bowel sounds.  No masses. Skin: No rashes. Neurologic: Facial musculature symmetric. Psychiatric: Patient is appropriate throughout our interaction. Lymphatic: No cervical lymphadenopathy Musculoskeletal: Gait intact.   LABS: Results for orders placed in visit on 27/03/50  BASIC METABOLIC PANEL      Result Value Ref Range   Sodium 139  135 - 145 mEq/L   Potassium 4.1  3.5 - 5.1 mEq/L   Chloride 105  96 - 112 mEq/L   CO2 25  19 - 32 mEq/L   Glucose, Bld 98  70 - 99 mg/dL   BUN 8  6 - 23 mg/dL   Creatinine, Ser 0.7  0.4 - 1.2 mg/dL   Calcium 9.2  8.4 - 10.5 mg/dL   GFR 93.16  >60.00 mL/min  CBC WITH DIFFERENTIAL      Result Value Ref Range   WBC 6.8  4.5 - 10.5 K/uL   RBC 5.07  3.87 - 5.11 Mil/uL   Hemoglobin 15.6 (*) 12.0 - 15.0 g/dL   HCT 46.0  36.0 - 46.0 %   MCV 90.7  78.0 - 100.0 fl   MCHC 33.9  30.0 - 36.0 g/dL   RDW 13.7  11.5 - 14.6 %   Platelets 425.0 (*) 150.0 - 400.0 K/uL   Neutrophils Relative % 64.9  43.0 - 77.0 %   Lymphocytes Relative 23.5  12.0 - 46.0 %   Monocytes Relative 9.5  3.0 - 12.0 %   Eosinophils Relative 1.5  0.0 - 5.0 %   Basophils Relative 0.6  0.0 - 3.0 %   Neutro Abs 4.4  1.4 - 7.7 K/uL   Lymphs Abs 1.6  0.7 - 4.0 K/uL   Monocytes Absolute 0.6  0.1 - 1.0 K/uL   Eosinophils Absolute 0.1  0.0 - 0.7 K/uL   Basophils Absolute 0.0  0.0 - 0.1 K/uL  LIPID PANEL      Result Value Ref Range   Cholesterol 181  0 - 200 mg/dL   Triglycerides 62.0  0.0 - 149.0 mg/dL   HDL 55.20  >39.00 mg/dL   VLDL 12.4  0.0 - 40.0 mg/dL   LDL Cholesterol 113 (*) 0 - 99 mg/dL   Total CHOL/HDL  Ratio 3    TSH      Result Value  Ref Range   TSH 1.28  0.35 - 5.50 uIU/mL  HEPATIC FUNCTION PANEL      Result Value Ref Range   Total Bilirubin 0.7  0.3 - 1.2 mg/dL   Bilirubin, Direct 0.0  0.0 - 0.3 mg/dL   Alkaline Phosphatase 58  39 - 117 U/L   AST 20  0 - 37 U/L   ALT 20  0 - 35 U/L   Total Protein 7.1  6.0 - 8.3 g/dL   Albumin 4.2  3.5 - 5.2 g/dL     EKG/XRAY:   Primary read interpreted by Dr. Marin Comment at [facility].   ASSESSMENT/PLAN: Encounter Diagnoses  Name Primary?  . Sinusitis, acute Yes  . Cough   . Otalgia    Rx augmentin, hycodan and tessalon perles If she does not improve then consider this as allergy related , try otc nasonex F/u prn  Gross sideeffects, risk and benefits, and alternatives of medications d/w patient. Patient is aware that all medications have potential sideeffects and we are unable to predict every sideeffect or drug-drug interaction that may occur.  Glenford Bayley, DO 02/22/2014 7:06 PM

## 2014-02-27 ENCOUNTER — Other Ambulatory Visit (HOSPITAL_COMMUNITY): Payer: 59

## 2014-02-27 ENCOUNTER — Ambulatory Visit (HOSPITAL_COMMUNITY): Admission: RE | Admit: 2014-02-27 | Payer: 59 | Source: Ambulatory Visit

## 2014-03-05 ENCOUNTER — Ambulatory Visit (HOSPITAL_COMMUNITY): Admission: RE | Admit: 2014-03-05 | Payer: 59 | Source: Ambulatory Visit

## 2014-03-05 ENCOUNTER — Ambulatory Visit (HOSPITAL_COMMUNITY): Payer: 59

## 2014-03-05 ENCOUNTER — Ambulatory Visit: Payer: 59

## 2014-03-05 ENCOUNTER — Ambulatory Visit (INDEPENDENT_AMBULATORY_CARE_PROVIDER_SITE_OTHER): Payer: Commercial Managed Care - PPO | Admitting: Emergency Medicine

## 2014-03-05 ENCOUNTER — Ambulatory Visit: Payer: Commercial Managed Care - PPO

## 2014-03-05 VITALS — BP 126/80 | HR 88 | Temp 97.7°F | Resp 18 | Ht 61.5 in | Wt 184.2 lb

## 2014-03-05 DIAGNOSIS — J209 Acute bronchitis, unspecified: Secondary | ICD-10-CM

## 2014-03-05 DIAGNOSIS — J018 Other acute sinusitis: Secondary | ICD-10-CM

## 2014-03-05 DIAGNOSIS — R059 Cough, unspecified: Secondary | ICD-10-CM

## 2014-03-05 DIAGNOSIS — R05 Cough: Secondary | ICD-10-CM

## 2014-03-05 MED ORDER — HYDROCOD POLST-CHLORPHEN POLST 10-8 MG/5ML PO LQCR: 5.0000 mL | Freq: Two times a day (BID) | ORAL | Status: DC | PRN

## 2014-03-05 MED ORDER — ALBUTEROL SULFATE HFA 108 (90 BASE) MCG/ACT IN AERS: 2.0000 | INHALATION_SPRAY | RESPIRATORY_TRACT | Status: DC | PRN

## 2014-03-05 MED ORDER — ALBUTEROL SULFATE (2.5 MG/3ML) 0.083% IN NEBU
2.5000 mg | INHALATION_SOLUTION | Freq: Once | RESPIRATORY_TRACT | Status: AC
  Administered 2014-03-05: 2.5 mg via RESPIRATORY_TRACT

## 2014-03-05 MED ORDER — PSEUDOEPHEDRINE-GUAIFENESIN ER 60-600 MG PO TB12: 1.0000 | ORAL_TABLET | Freq: Two times a day (BID) | ORAL | Status: DC

## 2014-03-05 MED ORDER — LEVOFLOXACIN 500 MG PO TABS: 500.0000 mg | ORAL_TABLET | Freq: Every day | ORAL | Status: AC

## 2014-03-05 NOTE — Patient Instructions (Signed)
Metered Dose Inhaler (No Spacer Used) Inhaled medicines are the basis of asthma treatment and other breathing problems. Inhaled medicine can only be effective if used properly. Good technique assures that the medicine reaches the lungs. Metered dose inhalers (MDIs) are used to deliver a variety of inhaled medicines. These include quick relief or rescue medicines (such as bronchodilators) and controller medicines (such as corticosteroids). The medicine is delivered by pushing down on a metal canister to release a set amount of spray. If you are using different kinds of inhalers, use your quick relief medicine to open the airways 10 15 minutes before using a steroid if instructed to do so by your health care provider. If you are unsure which inhalers to use and the order of using them, ask your health care provider, nurse, or respiratory therapist. HOW TO USE THE INHALER 1. Remove cap from inhaler. 2. If you are using the inhaler for the first time, you will need to prime it. Shake the inhaler for 5 seconds and release four puffs into the air, away from your face. Ask your health care provider or pharmacist if you have questions about priming your inhaler. 3. Shake inhaler for 5 seconds before each breath in (inhalation). 4. Position the inhaler so that the top of the canister faces up. 5. Put your index finger on the top of the medicine canister. Your thumb supports the bottom of the inhaler. 6. Open your mouth. 7. Either place the inhaler between your teeth and place your lips tightly around the mouthpiece, or hold the inhaler 1 2 inches away from your open mouth. If you are unsure of which technique to use, ask your health care provider. 8. Breathe out (exhale) normally and as completely as possible. 9. Press the canister down with the index finger to release the medicine. 10. At the same time as the canister is pressed, inhale deeply and slowly until the lungs are completely filled. This should take  4 6 seconds. Keep your tongue down. 11. Hold the medicine in your lungs for up to 5 10 seconds (10 seconds is best). This helps the medicine get into the small airways of your lungs. 12. Breathe out slowly, through pursed lips. Whistling is an example of pursed lips. 13. Wait at least 1 minute between puffs. Continue with the above steps until you have taken the number of puffs your health care provider has ordered. Do not use the inhaler more than your health care provider directs you to. 14. Replace cap on inhaler. 15. Follow the directions from your health care provider or the inhaler insert for cleaning the inhaler. If you are using a steroid inhaler, rinse your mouth with water after your last puff, gargle, and spit out the water. Do not swallow the water. AVOID:  Inhaling before or after starting the spray of medicine. It takes practice to coordinate your breathing with triggering the spray.  Inhaling through the nose (rather than the mouth) when triggering the spray. HOW TO DETERMINE IF YOUR INHALER IS FULL OR NEARLY EMPTY You cannot know when an inhaler is empty by shaking it. A few inhalers are now being made with dose counters. Ask your health care provider for a prescription that has a dose counter if you feel you need that extra help. If your inhaler does not have a counter, ask your health care provider to help you determine the date you need to refill your inhaler. Write the refill date on a calendar or your inhaler canister.  Refill your inhaler 7 10 days before it runs out. Be sure to keep an adequate supply of medicine. This includes making sure it is not expired, and you have a spare inhaler.  SEEK MEDICAL CARE IF:   Symptoms are only partially relieved with your inhaler.  You are having trouble using your inhaler.  You experience some increase in phlegm. SEEK IMMEDIATE MEDICAL CARE IF:   You feel little or no relief with your inhalers. You are still wheezing and are feeling  shortness of breath or tightness in your chest or both.  You have dizziness, headaches, or fast heart rate.  You have chills, fever, or night sweats.  There is a noticeable increase in phlegm production, or there is blood in the phlegm. Document Released: 08/16/2007 Document Revised: 06/21/2013 Document Reviewed: 04/06/2013 [facility] Patient Information 2014 [place], [place].

## 2014-03-05 NOTE — Progress Notes (Signed)
[facility] [address], [place] [address] [phone]  Date:  03/05/2014   Name:  Tiffany Carson   DOB:  12/28/1965   MRN:  [id number]  PCP:  Lottie Dawson, MD    Chief Complaint: Follow-up   History of Present Illness:  Tiffany Carson is a 48 y.o. very pleasant female patient who presents with the following:  One month duration of nasal congestion and mucopurulent drainage and post nasal drip.  Worse on right side.  Has a sore throat.  Cough productive of mucopurulent sputum with no shortness of breath.  Is wheezing a bit.  No nausea or vomiting. No stool change. On last dose of augmentin from last visit.  No improvement of symptoms since 4/22.  No improvement with over the counter medications or other home remedies. Denies other complaint or health concern today.   Patient Active Problem List   Diagnosis Date Noted  . Insomnia, persistent 02/19/2014  . Primary snoring 02/19/2014  . Focal dystonia 02/19/2014  . Task-specific dystonia of hand 02/19/2014  . Hand pain, right 12/04/2013  . Visit for preventive health examination 01/09/2013  . Medial knee pain 11/30/2012  . Gynecomastia, female 02/20/2011  . Neck pain 02/20/2011  . ACQUIRED ABSENCE OF BOTH CERVIX AND UTERUS 10/03/2010  . TREMOR 07/02/2010  . VITAMIN D DEFICIENCY 06/02/2010  . OBESITY 06/24/2009  . SLEEPLESSNESS 11/16/2007  . GERD 08/08/2007    Past Medical History  Diagnosis Date  . FIBROIDS, UTERUS 09/03/2010  . VITAMIN D DEFICIENCY 06/02/2010  . HYPERLIPIDEMIA 08/08/2007  . OBESITY 06/24/2009  . ANEMIA, IRON DEFICIENCY 07/02/2010  . GERD 08/08/2007  . Irregular menstrual cycle 06/24/2009  . LEG PAIN, RIGHT 10/03/2010  . LEG CRAMPS 06/24/2009  . SLEEPLESSNESS 11/16/2007  . FATIGUE 06/24/2009  . TREMOR 07/02/2010  . TINGLING 10/03/2010  . COUGH, CHRONIC 04/09/2010  . ABDOMINAL PAIN RIGHT LOWER QUADRANT 10/03/2010  . Acquired absence of both cervix and uterus 10/03/2010    Past  Surgical History  Procedure Laterality Date  . Cholecystectomy    . Oophorectomy      left   salpingesctomy   . Abdominal hysterectomy  nov 2011    secondary infection  had adhesions  . Breast reduction surgery  09 20 12  . Breast surgery      History  Substance Use Topics  . Smoking status: Never Smoker   . Smokeless tobacco: Never Used  . Alcohol Use: 0.0 - 0.5 oz/week    0-1 drink(s) per week     Comment: rarely    Family History  Problem Relation Age of Onset  . Arthritis    . Hypertension    . Diabetes Mother   . Hypertension Mother   . Diabetes Brother 52    2013, type 2    Allergies  Allergen Reactions  . Dexamethasone     Face turned a little bit red after taking a dose of this for poison ivy.    Medication list has been reviewed and updated.  Current Outpatient Prescriptions on File Prior to Visit  Medication Sig Dispense Refill  . benzonatate (TESSALON) 100 MG capsule Take 2 capsules (200 mg total) by mouth 2 (two) times daily as needed for cough.  30 capsule  1  . carbidopa-levodopa (SINEMET) 25-100 MG per tablet Take 1 tablet by mouth 3 (three) times daily.  90 tablet  2  . ibuprofen (ADVIL,MOTRIN) 200 MG tablet Take 200 mg by mouth every  6 (six) hours as needed.        . meloxicam (MOBIC) 15 MG tablet TAKE 1 TABLET BY MOUTH ONCE DAILY  30 tablet  2  . omeprazole (PRILOSEC) 20 MG capsule Take 20 mg by mouth as needed.      . Suvorexant (BELSOMRA) 15 MG TABS Take 15 mg by mouth as directed. Coupon for 10 free tabs.  10 tablet  1  . Vitamin D, Cholecalciferol, 400 UNITS CHEW Chew by mouth.       Marland Kitchen amoxicillin-clavulanate (AUGMENTIN) 875-125 MG per tablet Take 1 tablet by mouth 2 (two) times daily.  20 tablet  0  . HYDROcodone-homatropine (HYCODAN) 5-1.5 MG/5ML syrup Take 5 mLs by mouth at bedtime as needed for cough.  120 mL  0   No current facility-administered medications on file prior to visit.    Review of Systems:  As per HPI, otherwise negative.     Physical Examination: Filed Vitals:   03/05/14 1727  BP: 126/80  Pulse: 88  Temp: 97.7 F (36.5 C)  Resp: 18   Filed Vitals:   03/05/14 1727  Height: 5' 1.5" (1.562 m)  Weight: 184 lb 3.2 oz (83.553 kg)   Body mass index is 34.25 kg/(m^2). Ideal Body Weight: Weight in (lb) to have BMI = 25: 134.2  GEN: obese, NAD, Non-toxic, A & O x 3 HEENT: Atraumatic, Normocephalic. Neck supple. No masses, No LAD. Ears and Nose: No external deformity. CV: RRR, No M/G/R. No JVD. No thrill. No extra heart sounds. PULM: CTA B, scant wheezes, crackles, rhonchi. No retractions. No resp. distress. No accessory muscle use. ABD: S, NT, ND, +BS. No rebound. No HSM. EXTR: No c/c/e NEURO Normal gait.  PSYCH: Normally interactive. Conversant. Not depressed or anxious appearing.  Calm demeanor.    Assessment and Plan: Bronchitis Sinusitis levaquin mucinex d tussionex Albuterol MDI  Signed,  Ellison Carwin, MD   [facility] reading (PRIMARY) by  Dr. Ouida Sills  Negative chest.

## 2014-03-19 ENCOUNTER — Telehealth: Payer: Self-pay | Admitting: Internal Medicine

## 2014-03-19 ENCOUNTER — Ambulatory Visit: Payer: 59

## 2014-03-19 ENCOUNTER — Ambulatory Visit
Admission: RE | Admit: 2014-03-19 | Discharge: 2014-03-19 | Disposition: A | Discharge status: Home / Self Care | Payer: 59 | Source: Ambulatory Visit

## 2014-03-19 ENCOUNTER — Telehealth: Payer: Self-pay | Admitting: Neurology

## 2014-03-19 DIAGNOSIS — Z9889 Other specified postprocedural states: Secondary | ICD-10-CM

## 2014-03-19 DIAGNOSIS — Z1231 Encounter for screening mammogram for malignant neoplasm of breast: Secondary | ICD-10-CM

## 2014-03-19 MED ORDER — ZOLPIDEM TARTRATE 10 MG PO TABS: 10.0000 mg | ORAL_TABLET | Freq: Every evening | ORAL | Status: DC | PRN

## 2014-03-19 NOTE — Telephone Encounter (Signed)
[facility] [place] is requesting re-fill on ZOLPIDEM 10 MG. TAKE 1 TO 1 1/2 PO AT BEDTIME FOR SLEEP, 45 PER 30 DAYS.   The RX was discontinued on 02/19/14 by Larey Seat, MD.

## 2014-03-19 NOTE — Telephone Encounter (Signed)
Pt called about re-fill status.  I advised pt to call Dr. Edwena Felty office since that was the MD who took her off the Ambien and prescribed her a different RX. Pt verbalized understanding.

## 2014-03-19 NOTE — Telephone Encounter (Signed)
Pt is calling with urgency because she says that when she called her pharmacy regarding her refill which is due now, she was informed that Dr. Brett Fairy had discontinued the Ambien.  Pt needs clarification, she says Dr. Brett Fairy gave her samples of Belsomra but did not tell her the Ambien would be d/c'ed.  Pt refuses to proceed with her sleep study which is scheduled for Friday, without the Ambien.

## 2014-03-19 NOTE — Telephone Encounter (Signed)
Please document what you told the pt when you advised that she call back to Dr. Edwena Felty office about sleep medication.  Thanks!

## 2014-03-19 NOTE — Telephone Encounter (Signed)
I called the patient back.  She said Belsomra did not work for her at all.  Says she would like the Rx for Ambien to be 10mg  tabs taking one and one half tabs (15mg ) at night.  States this is the only med/dose that works for her.  I asked her about insurance and she claims they will pay for #45 tabs per month because this is how her previous MD wrote the Rx.  Told her I would forward a message to the provider regarding this.  Please advise.  Thank you.

## 2014-03-19 NOTE — Telephone Encounter (Signed)
i want her to use belsomra- did it not work ? She can use Ambien, but that makes no sense in combination with belsomra. Will allow for 30 pills of ambien 10 mg.

## 2014-03-19 NOTE — Telephone Encounter (Signed)
i have not prescribed this medication, and asked her to try biofeedback and white noise. We have samples of Belsomra.

## 2014-03-19 NOTE — Telephone Encounter (Signed)
Denied refill    agree with Dr D she should get off of this medication  Would follow dr D advice as she is the sleep specialist  Misty please advise patient of this and see if she wishes to trysamples of new med per dr D

## 2014-03-19 NOTE — Telephone Encounter (Signed)
I do not think you have the patient on this medication any longer.

## 2014-03-19 NOTE — Telephone Encounter (Signed)
Pt is needing new rx ambien, pt states that her neurologist had given her samples of a new sleep aid, however she states it does not help her at all. Please send rx to [facility] out patient pharmacy.

## 2014-03-20 ENCOUNTER — Other Ambulatory Visit: Payer: Self-pay | Admitting: Internal Medicine

## 2014-03-20 DIAGNOSIS — R928 Other abnormal and inconclusive findings on diagnostic imaging of breast: Secondary | ICD-10-CM

## 2014-03-20 NOTE — Telephone Encounter (Signed)
She has enough ambien until she has the sleep study on Friday, will meet after that w to discuss the results. CD

## 2014-03-20 NOTE — Telephone Encounter (Signed)
Baxter Flattery called from [employer] [employer] wanted clarification on Ambien that Dr. Brett Fairy prescribed for the pt. I informed Baxter Flattery that Dr. Brett Fairy stated that she prescribed this medication for the pt for a sleep study that she was having on 03/23/14 and the doctor wanted the pt to have enough and that it was ok to change the directions on the Rx to taking 1-1/2 tablet at night as needed. Baxter Flattery verbalized understanding. Pt was called and inform of this matter. Pt verbalized understanding.

## 2014-03-20 NOTE — Telephone Encounter (Signed)
See other note from Dr. Brett Fairy.Marland Kitchen

## 2014-03-20 NOTE — Telephone Encounter (Signed)
Pt called back stating Dr. Edwena Felty office advised her to call here and see if Dr. Regis Bill would prescribe her 15 mg of Ambien.  I saw the notes from Dr. Brett Fairy and it did not state that.  I advised the pt that Dr. Brett Fairy will discuss her medication dosage after her sleep study on Friday but she was not happy with that answer.  She insists on making a med check appointment with Dr. Regis Bill to get her Ambien increased to 15 mg. I have her scheduled for 03/28/14.

## 2014-03-23 ENCOUNTER — Ambulatory Visit (INDEPENDENT_AMBULATORY_CARE_PROVIDER_SITE_OTHER): Payer: Commercial Managed Care - PPO | Admitting: Neurology

## 2014-03-23 DIAGNOSIS — E669 Obesity, unspecified: Secondary | ICD-10-CM

## 2014-03-23 DIAGNOSIS — G4700 Insomnia, unspecified: Secondary | ICD-10-CM

## 2014-03-23 DIAGNOSIS — G249 Dystonia, unspecified: Secondary | ICD-10-CM

## 2014-03-23 DIAGNOSIS — R0683 Snoring: Secondary | ICD-10-CM

## 2014-03-23 DIAGNOSIS — G248 Other dystonia: Secondary | ICD-10-CM

## 2014-03-23 DIAGNOSIS — G478 Other sleep disorders: Secondary | ICD-10-CM

## 2014-03-28 ENCOUNTER — Ambulatory Visit: Payer: 59 | Admitting: Internal Medicine

## 2014-03-30 ENCOUNTER — Ambulatory Visit (INDEPENDENT_AMBULATORY_CARE_PROVIDER_SITE_OTHER): Payer: 59 | Admitting: Internal Medicine

## 2014-03-30 ENCOUNTER — Encounter: Payer: Self-pay | Admitting: Internal Medicine

## 2014-03-30 VITALS — BP 126/80 | Temp 98.2°F | Ht 62.25 in | Wt 184.0 lb

## 2014-03-30 DIAGNOSIS — M25469 Effusion, unspecified knee: Secondary | ICD-10-CM

## 2014-03-30 DIAGNOSIS — G4700 Insomnia, unspecified: Secondary | ICD-10-CM

## 2014-03-30 DIAGNOSIS — R259 Unspecified abnormal involuntary movements: Secondary | ICD-10-CM

## 2014-03-30 DIAGNOSIS — M25461 Effusion, right knee: Secondary | ICD-10-CM | POA: Insufficient documentation

## 2014-03-30 MED ORDER — ZOLPIDEM TARTRATE 10 MG PO TABS: ORAL_TABLET | ORAL | Status: DC

## 2014-03-30 NOTE — Progress Notes (Signed)
Chief Complaint  Patient presents with  . Insomnia    HPI: Pt under evaluation for sleep  And hand shaking  Has been under evaluation and recently had a sleep test. Doesn't know the results yet. Had been on Ambien for a while increasing to 15 mg a day to help her sleep. Dr. Cyndi Bender advised trying a different medication to try to get off the Ambien the patient states it didn't help and she tried it for about a week. It made her drowsy but she was unable to sleep. So she was given a small amount 30 pills in the meantime. She will be running out of this and comes in today asking [place]  to refill   the medication   ook 15 for the sleep study .    Takes after 10 and then  Gets up  About 5 30.   Driving.    Since her last visit here  Also has been seen x 2 at [facility] for resp problems   She also had an injury to her right knee had a problem and then twisted it has been a bit swollen and bothering her for months asks if she should see her orthopedist sports medicine Dr. Micheline Chapman no instability some popping around the kneecap.  ROS: See pertinent positives and negatives per HPI. Tremor   Poss dystonia    rx   to see a movement specialist Mammography as a callback.  Past Medical History  Diagnosis Date  . FIBROIDS, UTERUS 09/03/2010  . VITAMIN D DEFICIENCY 06/02/2010  . HYPERLIPIDEMIA 08/08/2007  . OBESITY 06/24/2009  . ANEMIA, IRON DEFICIENCY 07/02/2010  . GERD 08/08/2007  . Irregular menstrual cycle 06/24/2009  . LEG PAIN, RIGHT 10/03/2010  . LEG CRAMPS 06/24/2009  . SLEEPLESSNESS 11/16/2007  . FATIGUE 06/24/2009  . TREMOR 07/02/2010  . TINGLING 10/03/2010  . COUGH, CHRONIC 04/09/2010  . ABDOMINAL PAIN RIGHT LOWER QUADRANT 10/03/2010  . Acquired absence of both cervix and uterus 10/03/2010    Family History  Problem Relation Age of Onset  . Arthritis    . Hypertension    . Diabetes Mother   . Hypertension Mother   . Diabetes Brother 52    2013, type 2    History   Social History  . Marital  Status: Single    Spouse Name: n/a    Number of Children: 0  . Years of Education: 16   Occupational History  . NUCLEAR MEDICINE     [employer]   Social History Main Topics  . Smoking status: Never Smoker   . Smokeless tobacco: Never Used  . Alcohol Use: 0.0 - 0.5 oz/week    0-1 drink(s) per week     Comment: rarely  . Drug Use: No  . Sexual Activity: No   Other Topics Concern  . None   Social History Narrative   Lives alone, with 2 dogs.   Cousins live nearby.  Siblings are in middle [place].   Neg td  ocass etoh 1 caffeine per day    40 hours  Per week.    Patient is right-handed.   Patient has a Haematologist.          Outpatient Encounter Prescriptions as of 03/30/2014  Medication Sig  . albuterol (PROVENTIL HFA;VENTOLIN HFA) 108 (90 BASE) MCG/ACT inhaler Inhale 2 puffs into the lungs every 4 (four) hours as needed for wheezing or shortness of breath (cough, shortness of breath or wheezing.).  Marland Kitchen  carbidopa-levodopa (SINEMET) 25-100 MG per tablet Take 1 tablet by mouth 3 (three) times daily.  Marland Kitchen ibuprofen (ADVIL,MOTRIN) 200 MG tablet Take 200 mg by mouth every 6 (six) hours as needed.    . meloxicam (MOBIC) 15 MG tablet TAKE 1 TABLET BY MOUTH ONCE DAILY  . omeprazole (PRILOSEC) 20 MG capsule Take 20 mg by mouth as needed.  . Vitamin D, Cholecalciferol, 400 UNITS CHEW Chew by mouth.   . zolpidem (AMBIEN) 10 MG tablet Take 1 tablet (10 mg total) by mouth at bedtime as needed for sleep.  Marland Kitchen zolpidem (AMBIEN) 10 MG tablet 1 - 1.5 at night as needed  . [DISCONTINUED] amoxicillin-clavulanate (AUGMENTIN) 875-125 MG per tablet Take 1 tablet by mouth 2 (two) times daily.  . [DISCONTINUED] benzonatate (TESSALON) 100 MG capsule Take 2 capsules (200 mg total) by mouth 2 (two) times daily as needed for cough.  . [DISCONTINUED] chlorpheniramine-HYDROcodone (TUSSIONEX PENNKINETIC ER) 10-8 MG/5ML LQCR Take 5 mLs by mouth every 12 (twelve) hours as needed.  .  [DISCONTINUED] HYDROcodone-homatropine (HYCODAN) 5-1.5 MG/5ML syrup Take 5 mLs by mouth at bedtime as needed for cough.  . [DISCONTINUED] pseudoephedrine-guaifenesin (MUCINEX D) 60-600 MG per tablet Take 1 tablet by mouth every 12 (twelve) hours.  . [DISCONTINUED] Suvorexant (BELSOMRA) 15 MG TABS Take 15 mg by mouth as directed. Coupon for 10 free tabs.    EXAM:  BP 126/80  Temp(Src) 98.2 F (36.8 C) (Oral)  Ht 5' 2.25" (1.581 m)  Wt 184 lb (83.462 kg)  BMI 33.39 kg/m2  Body mass index is 33.39 kg/(m^2).  GENERAL: vitals reviewed and listed above, alert, oriented, appears well hydrated and in no acute distress Right lower extremity with mild anterior effusion. Patellar adequate range of motion no instability obvious no warmth or redness. PSYCH: pleasant and cooperative, no obvious depression or anxiety  ASSESSMENT AND PLAN:  Discussed the following assessment and plan:  Insomnia, unspecified - Is dependent on Ambien at higher dose to sleep . He appears she hasn't been tried on other things except for belsoma   Abnormal involuntary movements(781.0)  Swelling of joint of right knee  I discussed with her that I don't think imaging is a good solution to be on it the rest of her life and there may be other strategies but to stop suddenly she can get rebound insomnia and that is also perhaps adding to the failure the other medication. Uncertain what the plan should be but I feel uncomfortable continuing to prescribe this medicine without other strategies available.  We'll communicate with Dr. Keturah Barre. To make a plan   medication short term refill in the interim.  Call if she is running out and dont have another plan.  -Patient advised to return or notify health care team  if symptoms worsen ,persist or new concerns arise.  Patient Instructions  I will communicate with dr Dohmier about the [place].  Give you enough  Until  A suitable  plan is made.  It is possible that you were having rebound  insomia withdrawal when switching over med trial.  I dont plan on prescribing  ambien  Ongoing . I dont think  long term Lorrin Mais is the answer and sleep specialist opinion is welcomed  about what else to do to help with your sleep problem .  Lorrin Mais is a controlled substance that is habit forming and has a big down side but agree that you need some sleep .  Sometimes have to with draw and use other meds and  rocky in transition.   See Dr Micheline Chapman about your knee.   Standley Brooking. Panosh M.D.  Pre visit review using our clinic review tool, if applicable. No additional management support is needed unless otherwise documented below in the visit note. Total visit 19mins > 50% spent counseling and coordinating care

## 2014-03-30 NOTE — Patient Instructions (Signed)
I will communicate with dr Dohmier about the [place].  Give you enough  Until  A suitable  plan is made.  It is possible that you were having rebound insomia withdrawal when switching over med trial.  I dont plan on prescribing  ambien  Ongoing . I dont think  long term Lorrin Mais is the answer and sleep specialist opinion is welcomed  about what else to do to help with your sleep problem .  Lorrin Mais is a controlled substance that is habit forming and has a big down side but agree that you need some sleep .  Sometimes have to with draw and use other meds and rocky in transition.   See Dr Micheline Chapman about your knee.

## 2014-04-04 ENCOUNTER — Telehealth: Payer: Self-pay | Admitting: Neurology

## 2014-04-04 ENCOUNTER — Encounter: Payer: Self-pay | Admitting: *Deleted

## 2014-04-04 NOTE — Telephone Encounter (Signed)
I called and left a message for the patient about her recent sleep study results. I informed the patient that the study revealed mild obstructive sleep apnea and Upper airway resistancy syndrome and that Dr. Brett Fairy recommend an oral appliance which would help treat snoring and mild apnea sufficiently. I have ask the patient to callback to the office.  I will fax a copy of the report to Dr. Mariann Laster Panosh's office and mail a copy to the patient.

## 2014-04-06 ENCOUNTER — Other Ambulatory Visit: Payer: 59

## 2014-04-09 ENCOUNTER — Ambulatory Visit (INDEPENDENT_AMBULATORY_CARE_PROVIDER_SITE_OTHER): Payer: Commercial Managed Care - PPO | Admitting: Neurology

## 2014-04-09 ENCOUNTER — Encounter: Payer: Self-pay | Admitting: Neurology

## 2014-04-09 VITALS — BP 116/78 | HR 83 | Resp 16 | Ht 63.5 in | Wt 186.0 lb

## 2014-04-09 DIAGNOSIS — G249 Dystonia, unspecified: Secondary | ICD-10-CM

## 2014-04-09 DIAGNOSIS — R0683 Snoring: Secondary | ICD-10-CM | POA: Insufficient documentation

## 2014-04-09 DIAGNOSIS — G248 Other dystonia: Secondary | ICD-10-CM

## 2014-04-09 DIAGNOSIS — R0609 Other forms of dyspnea: Secondary | ICD-10-CM

## 2014-04-09 DIAGNOSIS — G2589 Other specified extrapyramidal and movement disorders: Secondary | ICD-10-CM

## 2014-04-09 DIAGNOSIS — R0989 Other specified symptoms and signs involving the circulatory and respiratory systems: Secondary | ICD-10-CM

## 2014-04-09 DIAGNOSIS — E669 Obesity, unspecified: Secondary | ICD-10-CM

## 2014-04-09 DIAGNOSIS — R259 Unspecified abnormal involuntary movements: Secondary | ICD-10-CM

## 2014-04-09 MED ORDER — ZOLPIDEM TARTRATE ER 12.5 MG PO TBCR
12.5000 mg | EXTENDED_RELEASE_TABLET | Freq: Every evening | ORAL | Status: DC | PRN
Start: 2014-04-09 — End: 2014-04-25

## 2014-04-09 MED ORDER — CARBIDOPA-LEVODOPA 25-100 MG PO TABS: 2.0000 | ORAL_TABLET | Freq: Three times a day (TID) | ORAL | Status: DC

## 2014-04-09 NOTE — Progress Notes (Signed)
[facility]  Provider:  Larey Seat, M D  Referring Provider: Burnis Medin, MD Primary Care Physician:  Lottie Dawson, MD  Chief Complaint  Patient presents with  . Follow-up    Room 11  . Results    HPI:  Tiffany Carson is a 48 y.o. female  Is seen here as a  revisit  ( from Dr. Regis Bill ) for possible dystonia  and chronic Insomnia.   Last visit note: Tiffany Carson had been seen in my sleep clinic in 2013, before we changed to the Epic system, and had been diagnosed with an essential tremor and compliant of Insomnia.  The patient had hot flushes at night , when originally seen. She had undergone a hysterectomy in 2011. She used Ambien , first at 5 mg , than 10 mg and meanwhile at 15 mg.  She is interested in a change in treatment. Her insomia started in early 2000, and in 2009 she became increasingly fatigued , was diagnosed with iron deficiency and vit D deficiency.  Those had been supplemented by her PCP, but her complaint remained that of insomnia, not longer fatigue. Her problem is sleep initiation.  Insomnia causes her to be less productive . Her sleep is not restorative or restful, she feels not groggy, but had sleep eating and sleep talking on Ambien.   The patient endorsed on 05-09-12 in a sleep consultation a fatigue severity of 16 points, an Epworth sleepiness score of 11 points. She had nausea for history, works normally 7-5 , 4  10 hour days weekly , and drinks about one cup of coffee a day.  Her sleep habits are described as going to bed around 11 PM and it'll still take her a while to 40-45 minutes on Ambien to initiate sleep. She wakes up at 5:30 spontaneously right before her alarm. She estimates her nocturnal sleep time between 5 and 6 hours. Much is not aware of waking up in the middle of the night but in the past (on Ambien) she has had long phone and text conversations at night - without any recall. She takes Ambien at 10 PM. No nocturia,   She sometimes wakes up from her own snoring, doesn't share her bedroom , there is not witness.  She has a dry mouth in the morning. Rarely will she take a nap ( 5 -6 times a year ) . We will discuss today bio feedback and meditation for insomnia, as well as BELSOMRA.  The presumed essential tremor had been treated with a betablocker, but is not responding to the current dose. She has had no increase in amplitude or frequency. Her tremor affects her handwriting.  The tremor remains right hand dominant.  New visit note 04-09-14 , RV.  The patient still requires Ambien to fall asleep. She will change to 12.5 mg Ambien formulation. She felt no improvement on Belsomra and the costs were much higher. Her gynecologist took her [facility] and she is menopausal. The patient takes one cup of coffee a day, no SODA, no iced tea.  She noted some fatigue over the last 14 days, independent of Belsomra, and associated with joint and muscle pain. Anemia is corrected ( iron deficient) .  The patient underwent a sleep study on 03-23-14, which revealed a low AHI and RDI and only REM accentuated AHi of 27.9. She will be referred for a dental device.   The trial of sinemet made a little difference. The " tremor" did not stop,  but was milder. When she forgot to take sinemet, the shaking worsened.  Task specific dystonia?  Her right hand and arm "are clumsy", especially while handwriting. The trembling is not present with turning keys, tying shoelaces or buttoning her blouse.    Review of Systems: Out of a complete 14 system review, the patient complains of only the following symptoms, and all other reviewed systems are negative. Epworth 4, FSS 14. GDS 2 points.  She is not sleepy , only bothered by the dry mouth.  She lives and sleeps alone, snoring doesn't affect anybody .   History   Social History  . Marital Status: Single    Spouse Name: n/a    Number of Children: 0  . Years of Education: 16   Occupational  History  . NUCLEAR MEDICINE     [employer]   Social History Main Topics  . Smoking status: Never Smoker   . Smokeless tobacco: Never Used  . Alcohol Use: 0.0 - 0.5 oz/week    0-1 drink(s) per week     Comment: rarely  . Drug Use: No  . Sexual Activity: No   Other Topics Concern  . Not on file   Social History Narrative   Lives alone, with 1 dog.   Cousins live nearby.  Siblings are in middle [place].   Neg td  ocass etoh 1 caffeine per day    40 hours  Per week.    Patient is right-handed.   Patient has a Haematologist.             Family History  Problem Relation Age of Onset  . Arthritis    . Hypertension    . Diabetes Mother   . Hypertension Mother   . Diabetes Brother 52    2013, type 2    Past Medical History  Diagnosis Date  . FIBROIDS, UTERUS 09/03/2010  . VITAMIN D DEFICIENCY 06/02/2010  . HYPERLIPIDEMIA 08/08/2007  . OBESITY 06/24/2009  . ANEMIA, IRON DEFICIENCY 07/02/2010  . GERD 08/08/2007  . Irregular menstrual cycle 06/24/2009  . LEG PAIN, RIGHT 10/03/2010  . LEG CRAMPS 06/24/2009  . SLEEPLESSNESS 11/16/2007  . FATIGUE 06/24/2009  . TREMOR 07/02/2010  . TINGLING 10/03/2010  . COUGH, CHRONIC 04/09/2010  . ABDOMINAL PAIN RIGHT LOWER QUADRANT 10/03/2010  . Acquired absence of both cervix and uterus 10/03/2010    Past Surgical History  Procedure Laterality Date  . Cholecystectomy    . Oophorectomy      left   salpingesctomy   . Abdominal hysterectomy  nov 2011    secondary infection  had adhesions  . Breast reduction surgery  09 20 12  . Breast surgery      Current Outpatient Prescriptions  Medication Sig Dispense Refill  . albuterol (PROVENTIL HFA;VENTOLIN HFA) 108 (90 BASE) MCG/ACT inhaler Inhale 2 puffs into the lungs every 4 (four) hours as needed for wheezing or shortness of breath (cough, shortness of breath or wheezing.).  1 Inhaler  1  . carbidopa-levodopa (SINEMET) 25-100 MG per tablet Take 1 tablet by mouth 3 (three)  times daily.  90 tablet  2  . ibuprofen (ADVIL,MOTRIN) 200 MG tablet Take 200 mg by mouth every 6 (six) hours as needed.        . meloxicam (MOBIC) 15 MG tablet TAKE 1 TABLET BY MOUTH ONCE DAILY  30 tablet  2  . omeprazole (PRILOSEC) 20 MG capsule Take 20 mg by mouth as needed.      Marland Kitchen  Vitamin D, Cholecalciferol, 400 UNITS CHEW Chew by mouth.       . zolpidem (AMBIEN) 10 MG tablet Take 1 tablet (10 mg total) by mouth at bedtime as needed for sleep.  30 tablet  0  . zolpidem (AMBIEN) 10 MG tablet 1 - 1.5 at night as needed  20 tablet  0   No current facility-administered medications for this visit.    Allergies as of 04/09/2014 - Review Complete 04/09/2014  Allergen Reaction Noted  . Dexamethasone      Vitals: BP 116/78  Pulse 83  Resp 16  Ht 5' 3.5" (1.613 m)  Wt 186 lb (84.369 kg)  BMI 32.43 kg/m2 Last Weight:  Wt Readings from Last 1 Encounters:  04/09/14 186 lb (84.369 kg)   Last Height:   Ht Readings from Last 1 Encounters:  04/09/14 5' 3.5" (1.613 m)    Physical exam:  General: The patient is awake, alert and appears not in acute distress. The patient is well groomed. Head: Normocephalic, atraumatic. Neck is supple. Mallampati 2 with a small opening, lateral crowding. Neck circumference: 15.75 inches. Thick neck , no nasal obstruction. No TMJ . Crowded small jaw, dental status is good.  Cardiovascular:  Regular rate and rhythm , without  murmurs or carotid bruit, and without distended neck veins. Respiratory: Lungs are clear to auscultation. Skin:  Without evidence of edema, or rash Trunk: BMI is  elevated and patient  has normal posture. BMI increased for 30 to 33 since last visit.   Neurologic exam : The patient is awake and alert, oriented to place and time.  Memory subjective  described as intact.  There is a normal attention span & concentration ability.  Speech is fluent without  dysarthria, dysphonia or aphasia.  Mood and affect are appropriate.  Cranial  nerves: Pupils are equal and briskly reactive to light. Funduscopic exam without  evidence of pallor or edema.  Extraocular movements  in vertical and horizontal planes intact and without nystagmus. Visual fields by finger perimetry are intact. Hearing to finger rub intact.  Facial sensation intact to fine touch. Facial motor strength is symmetric and tongue and uvula move midline.  Motor exam:   Normal tone and muscle bulk and symmetric strength in all extremities.  Sensory:  Fine touch, pinprick and vibration were tested in all extremities. Proprioception is normal.  Coordination: Rapid alternating movements in the fingers/hands is  with evidence of dysmetria, not ataxia and there is not tremor.    Gait and station: Patient walks without assistive device and is able and assisted stool climb up to the exam table. Strength within normal limits.  Stance is stable and normal. Tandem gait is intact  , turns  are unfragmented. Romberg testing is normal.  Deep tendon reflexes: in the  upper and lower extremities are symmetric and intact. Babinski maneuver response is  downgoing.   Assessment:  After physical and neurologic examination, review of laboratory studies, imaging, neurophysiology testing and pre-existing records, assessment is  1) concerned about sleep apnea, patient never had the EMG/NCS or sleep test set up after her last visit ( consult). SPLIT study ordered. She has further gained weight, and now wakes with choking , dry mouth.   2) tremor still stable, after 2 years, doubt Parkinson's Disease. Her tremor is not a resting but an action tremor, and she has trouble writing or drawing. Perpendicular pen stabilization shows onset of a hand spasm and this is task specific. .  This could be  an action induced dystonia. Trial of sinemet to see if her movements are dopamine responsive.   There hasn't been any structural insult .    Plan:  Treatment plan and additional workup :      Insomnia with Ambien dependency , will prescribe 12.8 mg form nightly .  300 mg sinemet. PO, divided 3 times  daily had some improvement. She will  Increase to 2 tab at AM and leave the other doses as is. She is to see  either Dr.Dr. Rexene Alberts or Dr. Janann Colonel for a dystonia evaluation.

## 2014-04-09 NOTE — Patient Instructions (Signed)
Dystonias  The dystonias are movement disorders in which sustained muscle contractions cause twisting and repetitive movements or abnormal postures. The movements, which are involuntary and sometimes painful, may affect a single muscle; a group of muscles such as those in the arms, legs, or neck; or the entire body. Early symptoms (problems) may include a deterioration in handwriting after writing several lines, foot cramps, and a tendency of one foot to pull up or drag after running or walking some distance. Other possible symptoms are tremor and voice or speech difficulties. Birth injury (particularly due to lack of oxygen), certain infections, reactions to certain drugs, heavy-metal or carbon monoxide poisoning, trauma (damage caused by an accident), or stroke can cause dystonic symptoms. About half the cases of dystonia have no connection to disease or injury and are called primary or idiopathic dystonia. Of the primary dystonias, many cases appear to be inherited in a dominant manner. Dystonias can also be symptoms of other diseases, some of which may be hereditary (passed down from parents). In some individuals, symptoms of a dystonia appear spontaneously in childhood between the ages of 5 and 16, usually in the foot or in the hand. For other individuals, the symptoms emerge in late adolescence or early adulthood.  TREATMENT   No one treatment has been found universally effective for dystonia. Instead, physicians use a variety of therapies (medications, surgery and other treatments such as physical therapy, splinting, stress management, and biofeedback), aimed at reducing or eliminating muscle spasms and pain. Since response to drugs varies among patients and even in the same person over time, the therapy must be individualized.  PROGNOSIS  The initial symptoms can be very mild and may be noticeable only after prolonged exertion, stress, or fatigue. Over a period of time, the symptoms may become more  noticeable and widespread and be unrelenting; sometimes, however, there is little or no progression.  RESEARCH BEING DONE  Investigators believe that the dystonias result from an abnormality in an area of the brain called the basal ganglia, where some of the messages that initiate muscle contractions are processed. Scientists suspect a defect in the body's ability to process a group of chemicals called neurotransmitters that help cells in the brain communicate with each other. Scientists at the [facility] laboratories have conducted detailed investigations of the pattern of muscle activity in persons with dystonias. Studies using EEG analysis and neuroimaging are probing brain activity. The search for the gene or genes responsible for some forms of dominantly inherited dystonias continues. In 1989, a team of researchers mapped a gene for early-onset torsion dystonia to chromosome 9; the gene was subsequently named DYT1. In 1997, the team sequenced the DYT1 gene and found that it codes for a previously unknown protein now called "torsin A."  Document Released: 10/09/2002 Document Revised: 01/11/2012 Document Reviewed: 10/19/2005  ExitCare® Patient Information ©2014 [employer].

## 2014-04-10 ENCOUNTER — Ambulatory Visit
Admission: RE | Admit: 2014-04-10 | Discharge: 2014-04-10 | Disposition: A | Discharge status: Home / Self Care | Payer: 59 | Source: Ambulatory Visit | Attending: Internal Medicine | Admitting: Internal Medicine

## 2014-04-10 DIAGNOSIS — R928 Other abnormal and inconclusive findings on diagnostic imaging of breast: Secondary | ICD-10-CM

## 2014-04-16 ENCOUNTER — Other Ambulatory Visit: Payer: 59

## 2014-04-16 ENCOUNTER — Institutional Professional Consult (permissible substitution): Payer: Commercial Managed Care - PPO | Admitting: Neurology

## 2014-04-23 ENCOUNTER — Encounter: Payer: Self-pay | Admitting: Family Medicine

## 2014-04-23 ENCOUNTER — Ambulatory Visit (INDEPENDENT_AMBULATORY_CARE_PROVIDER_SITE_OTHER): Payer: 59 | Admitting: Family Medicine

## 2014-04-23 VITALS — BP 113/78 | Ht 63.0 in | Wt 184.0 lb

## 2014-04-23 DIAGNOSIS — M25569 Pain in unspecified knee: Secondary | ICD-10-CM

## 2014-04-23 DIAGNOSIS — M25561 Pain in right knee: Secondary | ICD-10-CM

## 2014-04-23 NOTE — Progress Notes (Signed)
   [facility] [address] [place], [place] [address] Phone: [phone] Fax: [phone]   Patient Name: Tiffany Carson Date of Birth: [birth year] Medical Record Number: [id number] Gender: female Date of Encounter: 04/23/2014  History of Present Illness:  Tiffany Carson is a 48 y.o. very pleasant female patient who presents to the sports medicine clinic for evaluation of right knee pain.  She reports feeling pain in the medial aspect of the joint about 6-8 weeks ago. She had been sitting on the floor with legs crisscrossed more often than usual. 3 weeks ago, she got caught in an electrical cord which caused her right knee to bend laterally without hitting the floor. Pain went from medial aspect to anterior portion. Pain sometimes radiated to the upper thigh. There was some knee swelling at the time as well. No popping at the time. Prolonged activity made it worst as well as laying flat at night. She had been icing it which helped. Tried aleve and mobic which did not help. Pain has improved from a 7-8/10 to a 2-3/10 currently. Swelling has improved as well.   She received a steroid injection over a year ago in the right knee for similar pain and pain resolved until recently.    Past Medical, Surgical, Social, and Family History Reviewed. Medications and Allergies reviewed and all updated if necessary.  Review of Systems:  Denies any redness, warmth in joint. No surgery to the knee.  No numbness or tingling. Some occasional feeling of giving way, but no locking of the joint.  Otherwise, ROS negative except per HPI  Physical Examination: Filed Vitals:   04/23/14 1404  BP: 113/78   Filed Vitals:   04/23/14 1403  Height: 5\' 3"  (1.6 m)  Weight: 184 lb (83.462 kg)   Body mass index is 32.6 kg/(m^2).  General: well appearing, no acute distress, pleasant woman Knee: no soft tissue swelling or joint effusion, no tenderness along joint line, no  patellar tenderness ROM normal in flexion and extension and lower leg rotation.  Ligaments with solid consistent endpoints including ACL, PCL, LCL, MCL.  Negative Mcmurray's and provocative meniscal tests.   Knee injection. Consent obtained and timeout performed. Patient seated in a comfortable position with legs hanging off the table.  The medial Peri-patellar tendon space was palpated and marked. The skin was then cleaned with Betadine. Cold spray applied. A 25-gauge inch and a half needle was used to inject 14ml of depomedrol 40mg  and 33ml of lidocaine 1% without epinephrine.  Patient tolerated procedure well no bleeding. Pain improved following injection     Assessment and Plan: 48 yo female with right knee pain likely from meniscal injury.   # Right knee pain: Similar pain has responded to steroid injection in the past.  - steroid injection done today - patient to return to care if not better. Consider MRI at that point for further evaluation and possible referral for arthroscopy.    Liam Graham, MD

## 2014-04-24 NOTE — Progress Notes (Signed)
[facility] Attending Note: I have seen and examined this patient. I have discussed this patient with the resident and reviewed the assessment and plan as documented above. I agree with the resident's findings and plan.  

## 2014-04-25 ENCOUNTER — Telehealth: Payer: Self-pay | Admitting: Neurology

## 2014-04-25 MED ORDER — ZOLPIDEM TARTRATE ER 12.5 MG PO TBCR: 12.5000 mg | EXTENDED_RELEASE_TABLET | Freq: Every evening | ORAL | Status: DC | PRN

## 2014-04-25 MED ORDER — ZOLPIDEM TARTRATE 10 MG PO TABS: 10.0000 mg | ORAL_TABLET | Freq: Every evening | ORAL | Status: DC | PRN

## 2014-04-25 NOTE — Telephone Encounter (Signed)
Patient calling back and stated she would continue taking Ambien CR 12.5 mg.  The 10 mg would not be beneficial to her.  Patient requesting a return call to confirm.

## 2014-04-25 NOTE — Telephone Encounter (Signed)
I called the patient back.  Got no answer.  Left message relaying Dr Dohmeier's note.

## 2014-04-25 NOTE — Telephone Encounter (Signed)
Dr Dohmeier, Patient now states she would like to stay on Ambien CR 12.5mg  instead of the Ambien 10mg  tabs.  Thank you.

## 2014-04-25 NOTE — Telephone Encounter (Signed)
Patient indicates Ambien CR 12.5mg  does not work for her, and she would like to be changed to Ambien 10mg  taking one and one half tabs (15mg ) nightly.  Please advise.  Thank you.

## 2014-04-25 NOTE — Telephone Encounter (Signed)
Pt called to request if Dr. Brett Fairy can change the dosage of her medication zolpidem (AMBIEN CR) 12.5 MG CR tablet, pt would like to go back to the regular Ambien 15 mg says it works better. Please call pt to advise and when this has been called in. Thanks

## 2014-04-25 NOTE — Telephone Encounter (Signed)
15 mg Tiffany Carson is above the recommended limit, while 12.5 in CR form is not. She may not find her insurance willing to cover.

## 2014-04-25 NOTE — Telephone Encounter (Signed)
I called the patient back.  She is aware.   

## 2014-05-14 ENCOUNTER — Institutional Professional Consult (permissible substitution): Payer: Commercial Managed Care - PPO | Admitting: Neurology

## 2014-05-28 ENCOUNTER — Telehealth: Payer: Self-pay | Admitting: Neurology

## 2014-05-28 NOTE — Telephone Encounter (Signed)
Patient requesting an appointment with Dr. Janann Colonel to discuss botox therapy again, states that botox has already been denied by her insurance but wants to discuss it again. Please return call to patient and advise.

## 2014-05-29 NOTE — Telephone Encounter (Signed)
I have never seen her before. Dr Rexene Alberts or Krista Blue also do Botox. It may make more sense for her to follow up one of them. Thanks.

## 2014-05-29 NOTE — Telephone Encounter (Signed)
Called patient back before I saw this message to inform her of the denial. She still would like to have an appointment to see if there could be an appeal to this decision. I informed her that it would have to be new documentation submitted for review in order for it to be overturned. And that the provider would have to establish whether or not this is possible. She is awaiting a call.

## 2014-05-29 NOTE — Telephone Encounter (Signed)
Patient wanting a second consult Botox. Please review previous message.

## 2014-06-01 NOTE — Telephone Encounter (Signed)
Called pt to set up an appt with Dr. Krista Blue on 07/02/14 for Botox consult. I advised the pt that if she has any other problems, questions or concerns to call the office. Pt verbalized understanding.

## 2014-07-02 ENCOUNTER — Ambulatory Visit (INDEPENDENT_AMBULATORY_CARE_PROVIDER_SITE_OTHER): Payer: Commercial Managed Care - PPO | Admitting: Neurology

## 2014-07-02 ENCOUNTER — Encounter: Payer: Self-pay | Admitting: Neurology

## 2014-07-02 VITALS — BP 105/75 | HR 76 | Ht 63.0 in | Wt 173.0 lb

## 2014-07-02 DIAGNOSIS — G249 Dystonia, unspecified: Secondary | ICD-10-CM

## 2014-07-02 DIAGNOSIS — R259 Unspecified abnormal involuntary movements: Secondary | ICD-10-CM

## 2014-07-02 NOTE — Progress Notes (Signed)
PATIENT: Tiffany Carson DOB: 02-Mar-1966  HISTORICAL  Tiffany Carson is a 48 years old right-handed female, referred by Dr. Brett Fairy for evaluation of potential EMG guided injection for her right hand dystonia  She had a past medical history of chronic insomnia, is under the care of Dr. Brett Fairy, taking Ambien CR 12.5 mg every night, also recently diagnosed with ADHD, was started on by Vyvance.  She noticed mild bilateral hands tremor since age 40s, getting worse throughout the years, especially her right hand, there was no significant limitations in her activity has a Health visitor, she was able to started IV, if she find a certain position, no trouble using utensils, the most bothersome symptoms is writing with her right hand, she felt forceful pulling of her right wrist to extensor position, she felt stretching achy muscle pain at the right dorsal forearm, her handwriting has changed significantly over the years, she is planning on going to graduate school, difficulty writing has put major limitations  and social embarrassment, if she write with her left hand, there was no similar symptoms involving her right hand.  She has tried propranolol for one year 60 mg daily, without helping her symptoms, recently she was started on Sinemet 25/100, after 2 tablets 3 times a day without helping her symptoms as well, sometimes has GI side effect of nausea.  She never had a good sense of smell, she denies REM sleep disorder, not memory loss, no gait difficulty, no orthostatic dizziness, chronic constipation   ROS: Full 14 system review of systems performed and notable only for joint pain, muscle cramps, neck stiffness, dizziness, headaches, shortness of breath, leg swelling, eye itching  ALLERGIES: Allergies  Allergen Reactions  . Dexamethasone     Face turned a little bit red after taking a dose of this for poison ivy.    HOME MEDICATIONS: Current Outpatient Prescriptions on File  Prior to Visit  Medication Sig Dispense Refill  . albuterol (PROVENTIL HFA;VENTOLIN HFA) 108 (90 BASE) MCG/ACT inhaler Inhale 2 puffs into the lungs every 4 (four) hours as needed for wheezing or shortness of breath (cough, shortness of breath or wheezing.).  1 Inhaler  1  . carbidopa-levodopa (SINEMET) 25-100 MG per tablet Take 2 tablets by mouth 3 (three) times daily.  180 tablet  2  . ibuprofen (ADVIL,MOTRIN) 200 MG tablet Take 200 mg by mouth every 6 (six) hours as needed.        . meloxicam (MOBIC) 15 MG tablet TAKE 1 TABLET BY MOUTH ONCE DAILY  30 tablet  2  . omeprazole (PRILOSEC) 20 MG capsule Take 20 mg by mouth as needed.      . Vitamin D, Cholecalciferol, 400 UNITS CHEW Chew by mouth.       . zolpidem (AMBIEN CR) 12.5 MG CR tablet Take 1 tablet (12.5 mg total) by mouth at bedtime as needed for sleep.  30 tablet  0   No current facility-administered medications on file prior to visit.    PAST MEDICAL HISTORY: Past Medical History  Diagnosis Date  . FIBROIDS, UTERUS 09/03/2010  . VITAMIN D DEFICIENCY 06/02/2010  . HYPERLIPIDEMIA 08/08/2007  . OBESITY 06/24/2009  . ANEMIA, IRON DEFICIENCY 07/02/2010  . GERD 08/08/2007  . Irregular menstrual cycle 06/24/2009  . LEG PAIN, RIGHT 10/03/2010  . LEG CRAMPS 06/24/2009  . SLEEPLESSNESS 11/16/2007  . FATIGUE 06/24/2009  . TREMOR 07/02/2010  . TINGLING 10/03/2010  . COUGH, CHRONIC 04/09/2010  . ABDOMINAL PAIN RIGHT LOWER QUADRANT 10/03/2010  .  Acquired absence of both cervix and uterus 10/03/2010    PAST SURGICAL HISTORY: Past Surgical History  Procedure Laterality Date  . Cholecystectomy    . Oophorectomy      left   salpingesctomy   . Abdominal hysterectomy  nov 2011    secondary infection  had adhesions  . Breast reduction surgery  09 20 12  . Breast surgery      FAMILY HISTORY: Family History  Problem Relation Age of Onset  . Arthritis    . Hypertension    . Diabetes Mother   . Hypertension Mother   . Diabetes Brother 52     2013, type 2    SOCIAL HISTORY:  History   Social History  . Marital Status: Single    Spouse Name: n/a    Number of Children: 0  . Years of Education: 16   Occupational History  . NUCLEAR MEDICINE     [employer]   Social History Main Topics  . Smoking status: Never Smoker   . Smokeless tobacco: Never Used  . Alcohol Use: 0.0 - 0.5 oz/week    0-1 drink(s) per week     Comment: rarely  . Drug Use: No  . Sexual Activity: No   Other Topics Concern  . Not on file   Social History Narrative   Lives alone, with 1 dog.   Cousins live nearby.  Siblings are in middle [place].   Neg td  ocass etoh 1 caffeine per day    40 hours  Per week.    Patient is right-handed.   Patient has a Haematologist.              PHYSICAL EXAM   Filed Vitals:   07/02/14 0814  BP: 105/75  Pulse: 76  Height: 5\' 3"  (1.6 m)  Weight: 173 lb (78.472 kg)    Not recorded    Body mass index is 30.65 kg/(m^2).   Generalized: In no acute distress  Neck: Supple, no carotid bruits   Cardiac: Regular rate rhythm  Pulmonary: Clear to auscultation bilaterally  Musculoskeletal: No deformity  Neurological examination  Mentation: Alert oriented to time, place, history taking, and causual conversation  Cranial nerve II-XII: Pupils were equal round reactive to light. Extraocular movements were full.  Visual field were full on confrontational test. Bilateral fundi were sharp.  Facial sensation and strength were normal. Hearing was intact to finger rubbing bilaterally. Uvula tongue midline.  Head turning and shoulder shrug and were normal and symmetric.Tongue protrusion into cheek strength was normal.  Motor: Normal tone, bulk and strength. She has no significant rigidity, and noticed large amplitude right hand tremor while she writing, have a tendency to have excessive right wrist extension, grip pen very tightly with her right index, and thumb.  Sensory: Intact to  fine touch, pinprick, preserved vibratory sensation, and proprioception at toes.  Coordination: Normal finger to nose, heel-to-shin bilaterally there was no truncal ataxia  Gait: Rising up from seated position without assistance, normal stance, without trunk ataxia, moderate stride, good arm swing, smooth turning, able to perform tiptoe, and heel walking without difficulty.   Romberg signs: Negative  Deep tendon reflexes: Brachioradialis 2/2, biceps 2/2, triceps 2/2, patellar 2/2, Achilles 2/2, plantar responses were flexor bilaterally.   DIAGNOSTIC DATA (LABS, IMAGING, TESTING) - I reviewed patient records, labs, notes, testing and imaging myself where available.  Lab Results  Component Value Date   WBC 6.8 12/26/2012   HGB 15.6* 12/26/2012  HCT 46.0 12/26/2012   MCV 90.7 12/26/2012   PLT 425.0* 12/26/2012      Component Value Date/Time   NA 139 12/26/2012 0952   K 4.1 12/26/2012 0952   CL 105 12/26/2012 0952   CO2 25 12/26/2012 0952   GLUCOSE 98 12/26/2012 0952   BUN 8 12/26/2012 0952   CREATININE 0.7 12/26/2012 0952   CALCIUM 9.2 12/26/2012 0952   PROT 7.1 12/26/2012 0952   ALBUMIN 4.2 12/26/2012 0952   AST 20 12/26/2012 0952   ALT 20 12/26/2012 0952   ALKPHOS 58 12/26/2012 0952   BILITOT 0.7 12/26/2012 0952   GFRNONAA 100.56 10/03/2010 0000   Lab Results  Component Value Date   CHOL 181 12/26/2012   HDL 55.20 12/26/2012   LDLCALC 113* 12/26/2012   LDLDIRECT 135.4 06/27/2012   TRIG 62.0 12/26/2012   CHOLHDL 3 12/26/2012   Lab Results  Component Value Date   HGBA1C 5.7 06/27/2012   Lab Results  Component Value Date   VITAMINB12 488 10/17/2010   Lab Results  Component Value Date   TSH 1.28 12/26/2012    ASSESSMENT AND PLAN  Tiffany Carson is a 48 y.o. 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 Preauthorization for Botox,  2. Return to clinic for EMG  guided Botox injection after approval   Marcial Pacas, M.D. Ph.D.  [facility] [address], [place], [place] [address] [phone]

## 2014-08-17 ENCOUNTER — Other Ambulatory Visit: Payer: Self-pay

## 2014-08-27 ENCOUNTER — Other Ambulatory Visit: Payer: Self-pay | Admitting: Neurology

## 2014-09-03 ENCOUNTER — Other Ambulatory Visit: Payer: Self-pay | Admitting: Internal Medicine

## 2014-09-03 DIAGNOSIS — N6310 Unspecified lump in the right breast, unspecified quadrant: Secondary | ICD-10-CM

## 2014-09-19 ENCOUNTER — Encounter: Payer: Self-pay | Admitting: Neurology

## 2014-09-19 ENCOUNTER — Ambulatory Visit (INDEPENDENT_AMBULATORY_CARE_PROVIDER_SITE_OTHER): Payer: Commercial Managed Care - PPO | Admitting: Neurology

## 2014-09-19 DIAGNOSIS — G248 Other dystonia: Secondary | ICD-10-CM

## 2014-09-19 DIAGNOSIS — G249 Dystonia, unspecified: Secondary | ICD-10-CM

## 2014-09-19 DIAGNOSIS — G2589 Other specified extrapyramidal and movement disorders: Secondary | ICD-10-CM

## 2014-09-19 MED ORDER — ONABOTULINUMTOXINA 100 UNITS IJ SOLR
100.0000 [IU] | Freq: Once | INTRAMUSCULAR | Status: AC
  Administered 2014-09-19: 100 [IU] via INTRAMUSCULAR

## 2014-09-19 NOTE — Progress Notes (Signed)
PATIENT: Tiffany Carson DOB: Apr 21, 1966  HISTORICAL  Tiffany Carson is a 48 years old right-handed female, referred by Dr. Brett Fairy for evaluation of potential EMG guided injection for her right hand dystonia  She had a past medical history of chronic insomnia, is under the care of Dr. Brett Fairy, taking Ambien CR 12.5 mg every night, also recently diagnosed with ADHD, taking Vyvance.  She noticed mild bilateral hands tremor since age 54s, getting worse throughout the years, especially her right hand, there was no significant limitations in her activity has a Health visitor, she was able to started IV, if she find a certain position, no trouble using utensils, the most bothersome symptoms is writing with her right hand, she felt forceful pulling of her right wrist to extensor position, she felt stretching achy muscle pain at the right dorsal forearm, her handwriting has changed significantly over the years, she is planning on going to graduate school, difficulty writing has put major limitations  and social embarrassment, she has no trouble writing with her left hand.  She has tried propranolol for one year 60 mg daily, without helping her symptoms, recently she was started on Sinemet 25/100, after 2 tablets 3 times a day without helping her symptoms as well, sometimes has GI side effect of nausea.  She never had a good sense of smell, she denies REM sleep disorder, not memory loss, no gait difficulty, no orthostatic dizziness, chronic constipation  Update September 19 2014  She came in for her first electrical stimulation guided Botox injection for her right hand dystonia, potential side effect explained, she voiced understanding  ROS: Full 14 system review of systems performed and notable only for As above   ALLERGIES: Allergies  Allergen Reactions  . Dexamethasone     Face turned a little bit red after taking a dose of this for poison ivy.    HOME MEDICATIONS: Current  Outpatient Prescriptions on File Prior to Visit  Medication Sig Dispense Refill  . albuterol (PROVENTIL HFA;VENTOLIN HFA) 108 (90 BASE) MCG/ACT inhaler Inhale 2 puffs into the lungs every 4 (four) hours as needed for wheezing or shortness of breath (cough, shortness of breath or wheezing.). 1 Inhaler 1  . carbidopa-levodopa (SINEMET) 25-100 MG per tablet Take 2 tablets by mouth 3 (three) times daily. 180 tablet 2  . ibuprofen (ADVIL,MOTRIN) 200 MG tablet Take 200 mg by mouth every 6 (six) hours as needed.      . meloxicam (MOBIC) 15 MG tablet TAKE 1 TABLET BY MOUTH ONCE DAILY 30 tablet 2  . omeprazole (PRILOSEC) 20 MG capsule Take 20 mg by mouth as needed.    . Vitamin D, Cholecalciferol, 400 UNITS CHEW Chew by mouth.     . zolpidem (AMBIEN CR) 12.5 MG CR tablet TAKE 1 TABLET BY MOUTH AT BEDTIME AS NEEDED FOR SLEEP 30 tablet 5   No current facility-administered medications on file prior to visit.    PAST MEDICAL HISTORY: Past Medical History  Diagnosis Date  . FIBROIDS, UTERUS 09/03/2010  . VITAMIN D DEFICIENCY 06/02/2010  . HYPERLIPIDEMIA 08/08/2007  . OBESITY 06/24/2009  . ANEMIA, IRON DEFICIENCY 07/02/2010  . GERD 08/08/2007  . Irregular menstrual cycle 06/24/2009  . LEG PAIN, RIGHT 10/03/2010  . LEG CRAMPS 06/24/2009  . SLEEPLESSNESS 11/16/2007  . FATIGUE 06/24/2009  . TREMOR 07/02/2010  . TINGLING 10/03/2010  . COUGH, CHRONIC 04/09/2010  . ABDOMINAL PAIN RIGHT LOWER QUADRANT 10/03/2010  . Acquired absence of both cervix and uterus 10/03/2010  PAST SURGICAL HISTORY: Past Surgical History  Procedure Laterality Date  . Cholecystectomy    . Oophorectomy      left   salpingesctomy   . Abdominal hysterectomy  nov 2011    secondary infection  had adhesions  . Breast reduction surgery  09 20 12  . Breast surgery      FAMILY HISTORY: Family History  Problem Relation Age of Onset  . Arthritis    . Hypertension    . Diabetes Mother   . Hypertension Mother   . Diabetes Brother 52     2013, type 2    SOCIAL HISTORY:  History   Social History  . Marital Status: Single    Spouse Name: n/a    Number of Children: 0  . Years of Education: 16   Occupational History  . NUCLEAR MEDICINE     [employer]   Social History Main Topics  . Smoking status: Never Smoker   . Smokeless tobacco: Never Used  . Alcohol Use: 0.0 - 0.5 oz/week    0-1 drink(s) per week     Comment: rarely  . Drug Use: No  . Sexual Activity: No   Other Topics Concern  . Not on file   Social History Narrative   Lives alone, with 1 dog.   Cousins live nearby.  Siblings are in middle [place].   Neg td  ocass etoh 1 caffeine per day    40 hours  Per week.    Patient is right-handed.   Patient has a Haematologist.              PHYSICAL EXAM   Filed Vitals:    Not recorded      Cannot calculate BMI with a height equal to zero.   Generalized: In no acute distress  Neck: Supple, no carotid bruits   Cardiac: Regular rate rhythm  Pulmonary: Clear to auscultation bilaterally  Musculoskeletal: No deformity  Neurological examination  Mentation: Alert oriented to time, place, history taking, and causual conversation  Cranial nerve II-XII: Pupils were equal round reactive to light. Extraocular movements were full.  Visual field were full on confrontational test. Bilateral fundi were sharp.  Facial sensation and strength were normal. Hearing was intact to finger rubbing bilaterally. Uvula tongue midline.  Head turning and shoulder shrug and were normal and symmetric.Tongue protrusion into cheek strength was normal.  Motor: Normal tone, bulk and strength. She has no significant rigidity, and noticed large amplitude right hand tremor while she writing, have a tendency to have excessive right wrist extension, grip pen very tightly with her right index, and thumb, with right forearm pronation/supranation  Sensory: Intact to fine touch, pinprick, preserved  vibratory sensation, and proprioception at toes.  Coordination: Normal finger to nose, heel-to-shin bilaterally there was no truncal ataxia  Gait: Rising up from seated position without assistance, normal stance, without trunk ataxia, moderate stride, good arm swing, smooth turning, able to perform tiptoe, and heel walking without difficulty.   Romberg signs: Negative  Deep tendon reflexes: Brachioradialis 2/2, biceps 2/2, triceps 2/2, patellar 2/2, Achilles 2/2, plantar responses were flexor bilaterally.   DIAGNOSTIC DATA (LABS, IMAGING, TESTING) - I reviewed patient records, labs, notes, testing and imaging myself where available.  Lab Results  Component Value Date   WBC 6.8 12/26/2012   HGB 15.6* 12/26/2012   HCT 46.0 12/26/2012   MCV 90.7 12/26/2012   PLT 425.0* 12/26/2012      Component Value Date/Time  NA 139 12/26/2012 0952   K 4.1 12/26/2012 0952   CL 105 12/26/2012 0952   CO2 25 12/26/2012 0952   GLUCOSE 98 12/26/2012 0952   BUN 8 12/26/2012 0952   CREATININE 0.7 12/26/2012 0952   CALCIUM 9.2 12/26/2012 0952   PROT 7.1 12/26/2012 0952   ALBUMIN 4.2 12/26/2012 0952   AST 20 12/26/2012 0952   ALT 20 12/26/2012 0952   ALKPHOS 58 12/26/2012 0952   BILITOT 0.7 12/26/2012 0952   GFRNONAA 100.56 10/03/2010 0000   Lab Results  Component Value Date   CHOL 181 12/26/2012   HDL 55.20 12/26/2012   LDLCALC 113* 12/26/2012   LDLDIRECT 135.4 06/27/2012   TRIG 62.0 12/26/2012   CHOLHDL 3 12/26/2012   Lab Results  Component Value Date   HGBA1C 5.7 06/27/2012   Lab Results  Component Value Date   VITAMINB12 488 10/17/2010   Lab Results  Component Value Date   TSH 1.28 12/26/2012    ASSESSMENT AND PLAN  Tiffany Carson is a 48 y.o. 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  Under electrical stimulation, 50 units of Botox was injected, discarded 50 units, for total of 100 units of Botox a  Right pronator teres 12.5 Right extensor indicis proprius 12.5 Right extensor carpi  radialis longus 12.5 Flexor digitorum superficialis 12.5  She will return to clinic in 3 months for repeat injection     Marcial Pacas, M.D. Ph.D.  [facility] [address], [place], [place] [address] [phone]

## 2014-10-08 ENCOUNTER — Ambulatory Visit
Admission: RE | Admit: 2014-10-08 | Discharge: 2014-10-08 | Disposition: A | Discharge status: Home / Self Care | Payer: 59 | Source: Ambulatory Visit | Attending: Internal Medicine | Admitting: Internal Medicine

## 2014-10-08 ENCOUNTER — Ambulatory Visit (INDEPENDENT_AMBULATORY_CARE_PROVIDER_SITE_OTHER): Payer: Commercial Managed Care - PPO | Admitting: Neurology

## 2014-10-08 ENCOUNTER — Encounter: Payer: Self-pay | Admitting: Neurology

## 2014-10-08 VITALS — BP 115/74 | HR 87 | Ht 63.0 in | Wt 174.0 lb

## 2014-10-08 DIAGNOSIS — F488 Other specified nonpsychotic mental disorders: Secondary | ICD-10-CM

## 2014-10-08 DIAGNOSIS — N6310 Unspecified lump in the right breast, unspecified quadrant: Secondary | ICD-10-CM

## 2014-10-08 NOTE — Progress Notes (Signed)
PATIENT: Tiffany Carson DOB: 1965-12-02  HISTORICAL  Tiffany Carson is a 48 years old right-handed female, referred by Dr. Brett Fairy for evaluation of potential EMG guided injection for her right hand dystonia  She had a past medical history of chronic insomnia, is under the care of Dr. Brett Fairy, taking Ambien CR 12.5 mg every night, also recently diagnosed with ADHD, taking Vyvance.  She noticed mild bilateral hands tremor since age 25s, getting worse throughout the years, especially her right hand, there was no significant limitations in her activity has a Health visitor, she was able to started IV, if she find a certain position, no trouble using utensils, the most bothersome symptoms is writing with her right hand, she felt forceful pulling of her right wrist to extensor position, she felt stretching achy muscle pain at the right dorsal forearm, her handwriting has changed significantly over the years, she is planning on going to graduate school, difficulty writing has put major limitations  and social embarrassment, she has no trouble writing with her left hand.  She has tried propranolol for one year 60 mg daily, without helping her symptoms, recently she was started on Sinemet 25/100, after 2 tablets 3 times a day without helping her symptoms as well, sometimes has GI side effect of nausea.  She never had a good sense of smell, she denies REM sleep disorder, not memory loss, no gait difficulty, no orthostatic dizziness, chronic constipation  Update September 19 2014  She came in for her first electrical stimulation guided 50 units Botox injection for her right hand dystonia, potential side effect explained, she voiced understanding  UPDATE Dec 8th 2015:  follow-up her most recent Botox injection in September 19 2014, for her right writer's cramp, she reported moderate improvement, she can hold her pen better, but continue to struggle with writing, no difficulty with typing,  starting IV, and the other tasks require using right hand,  She did not notice any side effect   ROS: Full 14 system review of systems performed and notable only for As above   ALLERGIES: Allergies  Allergen Reactions  . Dexamethasone     Face turned a little bit red after taking a dose of this for poison ivy.    HOME MEDICATIONS: Current Outpatient Prescriptions on File Prior to Visit  Medication Sig Dispense Refill  . ibuprofen (ADVIL,MOTRIN) 200 MG tablet Take 200 mg by mouth every 6 (six) hours as needed.      . meloxicam (MOBIC) 15 MG tablet TAKE 1 TABLET BY MOUTH ONCE DAILY 30 tablet 2  . omeprazole (PRILOSEC) 20 MG capsule Take 20 mg by mouth as needed.    . Vitamin D, Cholecalciferol, 400 UNITS CHEW Chew by mouth.     . zolpidem (AMBIEN CR) 12.5 MG CR tablet TAKE 1 TABLET BY MOUTH AT BEDTIME AS NEEDED FOR SLEEP 30 tablet 5   No current facility-administered medications on file prior to visit.    PAST MEDICAL HISTORY: Past Medical History  Diagnosis Date  . FIBROIDS, UTERUS 09/03/2010  . VITAMIN D DEFICIENCY 06/02/2010  . HYPERLIPIDEMIA 08/08/2007  . OBESITY 06/24/2009  . ANEMIA, IRON DEFICIENCY 07/02/2010  . GERD 08/08/2007  . Irregular menstrual cycle 06/24/2009  . LEG PAIN, RIGHT 10/03/2010  . LEG CRAMPS 06/24/2009  . SLEEPLESSNESS 11/16/2007  . FATIGUE 06/24/2009  . TREMOR 07/02/2010  . TINGLING 10/03/2010  . COUGH, CHRONIC 04/09/2010  . ABDOMINAL PAIN RIGHT LOWER QUADRANT 10/03/2010  . Acquired absence of both cervix  and uterus 10/03/2010    PAST SURGICAL HISTORY: Past Surgical History  Procedure Laterality Date  . Cholecystectomy    . Oophorectomy      left   salpingesctomy   . Abdominal hysterectomy  nov 2011    secondary infection  had adhesions  . Breast reduction surgery  09 20 12  . Breast surgery      FAMILY HISTORY: Family History  Problem Relation Age of Onset  . Arthritis    . Hypertension    . Diabetes Mother   . Hypertension Mother   . Diabetes  Brother 52    2013, type 2    SOCIAL HISTORY:  History   Social History  . Marital Status: Single    Spouse Name: n/a    Number of Children: 0  . Years of Education: 16   Occupational History  . NUCLEAR MEDICINE     [employer]   Social History Main Topics  . Smoking status: Never Smoker   . Smokeless tobacco: Never Used  . Alcohol Use: 0.0 - 0.6 oz/week    0-1 Not specified per week     Comment: rarely  . Drug Use: No  . Sexual Activity: No   Other Topics Concern  . Not on file   Social History Narrative   Lives alone, with 1 dog.   Cousins live nearby.  Siblings are in middle [place].   Neg td  ocass etoh 1 caffeine per day    40 hours  Per week.    Patient is right-handed.   Patient has a Haematologist.              PHYSICAL EXAM   Filed Vitals:   10/08/14 1308  BP: 115/74  Pulse: 87  Height: 5\' 3"  (1.6 m)  Weight: 174 lb (78.926 kg)    Not recorded      Body mass index is 30.83 kg/(m^2).   Generalized: In no acute distress  Neck: Supple, no carotid bruits   Cardiac: Regular rate rhythm  Pulmonary: Clear to auscultation bilaterally  Musculoskeletal: No deformity  Neurological examination  Mentation: Alert oriented to time, place, history taking, and causual conversation  Cranial nerve II-XII: Pupils were equal round reactive to light. Extraocular movements were full.  Visual field were full on confrontational test. Bilateral fundi were sharp.  Facial sensation and strength were normal. Hearing was intact to finger rubbing bilaterally. Uvula tongue midline.  Head turning and shoulder shrug and were normal and symmetric.Tongue protrusion into cheek strength was normal.  Motor: Normal tone, bulk and strength. She has no significant rigidity, large amplitude right hand tremor while she writing, improved  tendency to have excessive right wrist extension, grip pen very tightly with her right index, and thumb, with right  forearm pronation/supranation  Sensory: Intact to fine touch, pinprick, preserved vibratory sensation, and proprioception at toes.  Coordination: Normal finger to nose, heel-to-shin bilaterally there was no truncal ataxia  Gait: Rising up from seated position without assistance, normal stance, without trunk ataxia, moderate stride, good arm swing, smooth turning, able to perform tiptoe, and heel walking without difficulty.   Romberg signs: Negative  Deep tendon reflexes: Brachioradialis 2/2, biceps 2/2, triceps 2/2, patellar 2/2, Achilles 2/2, plantar responses were flexor bilaterally.   DIAGNOSTIC DATA (LABS, IMAGING, TESTING) - I reviewed patient records, labs, notes, testing and imaging myself where available.  Lab Results  Component Value Date   WBC 6.8 12/26/2012   HGB 15.6* 12/26/2012  HCT 46.0 12/26/2012   MCV 90.7 12/26/2012   PLT 425.0* 12/26/2012      Component Value Date/Time   NA 139 12/26/2012 0952   K 4.1 12/26/2012 0952   CL 105 12/26/2012 0952   CO2 25 12/26/2012 0952   GLUCOSE 98 12/26/2012 0952   BUN 8 12/26/2012 0952   CREATININE 0.7 12/26/2012 0952   CALCIUM 9.2 12/26/2012 0952   PROT 7.1 12/26/2012 0952   ALBUMIN 4.2 12/26/2012 0952   AST 20 12/26/2012 0952   ALT 20 12/26/2012 0952   ALKPHOS 58 12/26/2012 0952   BILITOT 0.7 12/26/2012 0952   GFRNONAA 100.56 10/03/2010 0000   Lab Results  Component Value Date   CHOL 181 12/26/2012   HDL 55.20 12/26/2012   LDLCALC 113* 12/26/2012   LDLDIRECT 135.4 06/27/2012   TRIG 62.0 12/26/2012   CHOLHDL 3 12/26/2012   Lab Results  Component Value Date   HGBA1C 5.7 06/27/2012   Lab Results  Component Value Date   VITAMINB12 488 10/17/2010   Lab Results  Component Value Date   TSH 1.28 12/26/2012    ASSESSMENT AND PLAN  Tiffany Carson is a 48 y.o. 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 Ph.D.  [facility] [address] [place], [place] [address] [phone]

## 2014-10-15 ENCOUNTER — Ambulatory Visit: Payer: 59 | Attending: Neurology | Admitting: Occupational Therapy

## 2014-10-15 ENCOUNTER — Encounter: Payer: Self-pay | Admitting: Occupational Therapy

## 2014-10-15 DIAGNOSIS — G249 Dystonia, unspecified: Secondary | ICD-10-CM | POA: Insufficient documentation

## 2014-10-15 DIAGNOSIS — M6289 Other specified disorders of muscle: Secondary | ICD-10-CM | POA: Diagnosis not present

## 2014-10-15 DIAGNOSIS — R258 Other abnormal involuntary movements: Secondary | ICD-10-CM | POA: Diagnosis not present

## 2014-10-15 DIAGNOSIS — R29898 Other symptoms and signs involving the musculoskeletal system: Secondary | ICD-10-CM

## 2014-10-15 DIAGNOSIS — R251 Tremor, unspecified: Secondary | ICD-10-CM

## 2014-10-15 NOTE — Therapy (Signed)
[facility] [address] [place], [place], [address] Phone: [phone]   Fax:  [phone]  Occupational Therapy Evaluation  Patient Details  Name: Tiffany Carson MRN: [id number] Date of Birth: [birth year]  Encounter Date: 10/15/2014      OT End of Session - 10/15/14 1056    Visit Number 1   Number of Visits 9  eval + 8 visits   Date for OT Re-Evaluation 11/14/14   Authorization Type [facility]   OT Start Time 1020   OT Stop Time 1100   OT Time Calculation (min) 40 min   Activity Tolerance Patient tolerated treatment well      Past Medical History  Diagnosis Date  . FIBROIDS, UTERUS 09/03/2010  . VITAMIN D DEFICIENCY 06/02/2010  . HYPERLIPIDEMIA 08/08/2007  . OBESITY 06/24/2009  . ANEMIA, IRON DEFICIENCY 07/02/2010  . GERD 08/08/2007  . Irregular menstrual cycle 06/24/2009  . LEG PAIN, RIGHT 10/03/2010  . LEG CRAMPS 06/24/2009  . SLEEPLESSNESS 11/16/2007  . FATIGUE 06/24/2009  . TREMOR 07/02/2010  . TINGLING 10/03/2010  . COUGH, CHRONIC 04/09/2010  . ABDOMINAL PAIN RIGHT LOWER QUADRANT 10/03/2010  . Acquired absence of both cervix and uterus 10/03/2010    Past Surgical History  Procedure Laterality Date  . Cholecystectomy    . Oophorectomy      left   salpingesctomy   . Abdominal hysterectomy  nov 2011    secondary infection  had adhesions  . Breast reduction surgery  09 20 12  . Breast surgery      There were no vitals taken for this visit.  Visit Diagnosis:  Right hand weakness  Task-specific dystonia of hand  Occasional tremors      Subjective Assessment - 10/15/14 1027    Symptoms "My hand cramps when I'm writing"   Patient Stated Goals "I want to go back to school to become a PA and need to be able to write better"   Currently in Pain? No/denies          [facility] OT Assessment - 10/15/14 1031    Assessment   Diagnosis Rt hand dystonia   Onset Date --  10(+) years   Prior Therapy dry needling   Balance  Screen   Has the patient fallen in the past 6 months No   Has the patient had a decrease in activity level because of a fear of falling?  No   Is the patient reluctant to leave their home because of a fear of falling?  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  Lt = 22.16 sec.    Box and Blocks Rt = 60   Tone   Assessment Location Right Upper Extremity   AROM   Overall AROM  Within functional limits for tasks performed   Strength   Overall Strength Comments Grip strength: Rt = 40 lbs, Lt = 55 lbs   RUE Tone   RUE Tone Within Functional Limits                OT Long Term Goals - 10/15/14 1301    OT LONG TERM GOAL #1   Title Independent w/ HEP for coordination and grip strength (due 11/14/14)   Time 4   Period Weeks   Status New   OT LONG TERM GOAL #2   Title Improve Rt grip strength to 45 lbs or greater (initial eval = 40 lbs, Lt = 55 lbs)   Time 4   Period Weeks   Status New   OT LONG TERM GOAL #3   Title Improve handwriting to consistently 75% legibility after 5 min. or writing w/ A/E and/or compensatory strategies/adaptations  prn   Baseline Currently b/t 50% - 75%   Time 4   Period Weeks   Status New   OT LONG TERM GOAL #4   Title Pt to verbalize understanding w/ A/E and compensatory strategies/adaptations for writing and other tasks that are difficulty   Time 4   Period Weeks   Status New          Plan - 10/15/14 1255    Clinical Impression Statement Pt was seen for O.T. evaluation today for Rt dominant hand dystonia during task specific activities (writing). Pt has had this for over 10 years and has recently been seen for botox injections.    Rehab Potential Fair   Clinical Impairments Affecting Rehab Potential Chronic in nature and unsure whether therapy will make a change in physical impairments, however would benefit from O.T. to strengthen forearm, wrist and hand musculature and to train in compensatory strategies and A/E recommendations   OT Frequency 2x / week   OT Duration 4 weeks   OT Treatment/Interventions Self-care/ADL training;Fluidtherapy;DME and/or AE instruction;Splinting;Patient/family education;Therapeutic exercises;Therapeutic activities;Neuromuscular education;Passive range of motion;Parrafin;Energy conservation;Manual Therapy   Plan coordination and grip strength HEP, practice writing w/ A/E and or compensatory strategies/adaptations   Consulted and Agree with Plan of Care Patient                               Problem List Patient Active Problem List   Diagnosis Date Noted  . Snoring 04/09/2014  . Swelling of joint of right knee 03/30/2014  . Insomnia, persistent 02/19/2014  . Primary snoring 02/19/2014  . Focal dystonia 02/19/2014  . Task-specific dystonia of hand 02/19/2014  . Hand pain, right 12/04/2013  . Visit for preventive health examination 01/09/2013  . Medial knee pain 11/30/2012  . Gynecomastia, female 02/20/2011  . Neck pain 02/20/2011  . ACQUIRED ABSENCE OF BOTH CERVIX AND UTERUS 10/03/2010  . TREMOR 07/02/2010  . VITAMIN D DEFICIENCY  06/02/2010  . OBESITY 06/24/2009  . SLEEPLESSNESS 11/16/2007  . GERD 08/08/2007    Carey Bullocks 10/15/2014, 1:09 PM  Redmond Baseman, OTR/L 10/15/2014 1:11 PM Phone [phone] FAX [phone]

## 2014-10-29 ENCOUNTER — Encounter: Payer: 59 | Admitting: Occupational Therapy

## 2014-10-29 ENCOUNTER — Ambulatory Visit: Payer: 59 | Admitting: Occupational Therapy

## 2014-11-12 ENCOUNTER — Ambulatory Visit: Payer: 59 | Attending: Neurology | Admitting: Occupational Therapy

## 2014-11-12 ENCOUNTER — Encounter: Payer: Self-pay | Admitting: Occupational Therapy

## 2014-11-12 DIAGNOSIS — M6289 Other specified disorders of muscle: Secondary | ICD-10-CM | POA: Insufficient documentation

## 2014-11-12 DIAGNOSIS — R258 Other abnormal involuntary movements: Secondary | ICD-10-CM | POA: Diagnosis not present

## 2014-11-12 DIAGNOSIS — R251 Tremor, unspecified: Secondary | ICD-10-CM

## 2014-11-12 DIAGNOSIS — G249 Dystonia, unspecified: Secondary | ICD-10-CM | POA: Diagnosis not present

## 2014-11-12 DIAGNOSIS — R29898 Other symptoms and signs involving the musculoskeletal system: Secondary | ICD-10-CM

## 2014-11-12 NOTE — Patient Instructions (Signed)
  Coordination Activities  Perform the following activities for 20 minutes 1-2 times per day with right hand(s).   Rotate ball in fingertips (clockwise and counter-clockwise).  Flip cards 1 at a time as fast as you can.  Deal cards with your thumb (Hold deck in hand and push card off top with thumb).  Rotate card in hand (clockwise and counter-clockwise).  Pick up coins one at a time until you get 5-10 in your hand, then move coins from palm to fingertips to stack one at a time.  Practice writing and/or typing.   1. Grip Strengthening (Resistive Putty)   Squeeze putty using thumb and all fingers. Repeat _15-20___ times. Do _2-_3__ sessions per day.   2. Roll putty into tube on table and pinch between each finger and thumb x 10 reps each. (can do ring and small finger together). Do 2-3 sessions/day

## 2014-11-12 NOTE — Therapy (Signed)
[facility] [address] [place], [place], [address] Phone: [phone]   Fax:  [phone]  Occupational Therapy Treatment  Patient Details  Name: Tiffany Carson MRN: [id number] Date of Birth: 1966-06-02 Referring Provider:  Burnis Medin, MD  Encounter Date: 11/12/2014      OT End of Session - 11/12/14 1155    Visit Number 2   Number of Visits 9   Date for OT Re-Evaluation 12/14/14   Authorization Type [facility]   Authorization Time Period week 1/4   OT Start Time 1105   OT Stop Time 1150   OT Time Calculation (min) 45 min   Activity Tolerance Patient tolerated treatment well      Past Medical History  Diagnosis Date  . FIBROIDS, UTERUS 09/03/2010  . VITAMIN D DEFICIENCY 06/02/2010  . HYPERLIPIDEMIA 08/08/2007  . OBESITY 06/24/2009  . ANEMIA, IRON DEFICIENCY 07/02/2010  . GERD 08/08/2007  . Irregular menstrual cycle 06/24/2009  . LEG PAIN, RIGHT 10/03/2010  . LEG CRAMPS 06/24/2009  . SLEEPLESSNESS 11/16/2007  . FATIGUE 06/24/2009  . TREMOR 07/02/2010  . TINGLING 10/03/2010  . COUGH, CHRONIC 04/09/2010  . ABDOMINAL PAIN RIGHT LOWER QUADRANT 10/03/2010  . Acquired absence of both cervix and uterus 10/03/2010    Past Surgical History  Procedure Laterality Date  . Cholecystectomy    . Oophorectomy      left   salpingesctomy   . Abdominal hysterectomy  nov 2011    secondary infection  had adhesions  . Breast reduction surgery  09 20 12  . Breast surgery      There were no vitals taken for this visit.  Visit Diagnosis:  Task-specific dystonia of hand  Right hand weakness  Occasional tremors      Subjective Assessment - 11/12/14 1111    Symptoms "I found some strength balls over Christmas to work on my hand strength"   Patient Stated Goals "I want to go back to school to become a PA and need to be able to write better"   Currently in Pain? No/denies                 OT Treatments/Exercises  (OP) - 11/12/14 1114    ADLs   Writing Practiced writing name x 5 reps with and w/o adapted pens. Pt preferred regular pen over larger pen and Pen Again. Pt also instructed to try felt tip pen due to tremors when writing.    Exercises   Exercises Hand   Hand Exercises   Theraputty - Grip x 15 reps w/ green resistance. However, green resistance too difficult and therefore issued red putty for home use   Theraputty - Pinch Roll into tube and pinch b/t thumb and each finger x 10 reps each (ring and small finger together)   Fine Motor Coordination   Other Fine Motor Exercises Pt performed FM activities from HEP issued today. Pt performed each w/ difficulty rotating ball in fingertips, pushing out card w/ thumb and manipulating pennies for translation.                 OT Education - 11/12/14 1132    Education provided Yes   Education Details coordination and putty HEP   Person(s) Educated Patient   Methods Explanation;Demonstration;Handout   Comprehension Verbalized understanding;Returned demonstration             OT Long Term Goals - 10/15/14 1301    OT LONG TERM GOAL #1  Title Independent w/ HEP for coordination and grip strength (due 11/14/14)   Time 4   Period Weeks   Status New   OT LONG TERM GOAL #2   Title Improve Rt grip strength to 45 lbs or greater (initial eval = 40 lbs, Lt = 55 lbs)   Time 4   Period Weeks   Status New   OT LONG TERM GOAL #3   Title Improve handwriting to consistently 75% legibility after 5 min. or writing w/ A/E and/or compensatory strategies/adaptations prn   Baseline Currently b/t 50% - 75%   Time 4   Period Weeks   Status New   OT LONG TERM GOAL #4   Title Pt to verbalize understanding w/ A/E and compensatory strategies/adaptations for writing and other tasks that are difficulty   Time 4   Period Weeks   Status New               Plan - 11/12/14 1158    Clinical Impression Statement Pt tolerating all FM coordination  activities w/ difficulty and putty ex's from HEP issued today. Adapted pens do not seem to help w/ writing.    Plan review putty and coordination HEP prn, myofascial release and soft tissue mobs to forearm musculature, wrist and forearm strengthening        Problem List Patient Active Problem List   Diagnosis Date Noted  . Snoring 04/09/2014  . Swelling of joint of right knee 03/30/2014  . Insomnia, persistent 02/19/2014  . Primary snoring 02/19/2014  . Focal dystonia 02/19/2014  . Task-specific dystonia of hand 02/19/2014  . Hand pain, right 12/04/2013  . Visit for preventive health examination 01/09/2013  . Medial knee pain 11/30/2012  . Gynecomastia, female 02/20/2011  . Neck pain 02/20/2011  . ACQUIRED ABSENCE OF BOTH CERVIX AND UTERUS 10/03/2010  . TREMOR 07/02/2010  . VITAMIN D DEFICIENCY 06/02/2010  . OBESITY 06/24/2009  . SLEEPLESSNESS 11/16/2007  . GERD 08/08/2007    Carey Bullocks, OTR/L 11/12/2014, 12:01 PM  [facility] [address] [place], [place], [address] Phone: [phone]   Fax:  [phone]

## 2014-11-26 ENCOUNTER — Ambulatory Visit: Payer: 59 | Admitting: Occupational Therapy

## 2014-11-26 ENCOUNTER — Encounter: Payer: Self-pay | Admitting: Occupational Therapy

## 2014-11-26 DIAGNOSIS — M6289 Other specified disorders of muscle: Secondary | ICD-10-CM | POA: Diagnosis not present

## 2014-11-26 DIAGNOSIS — R29898 Other symptoms and signs involving the musculoskeletal system: Secondary | ICD-10-CM

## 2014-11-26 DIAGNOSIS — R251 Tremor, unspecified: Secondary | ICD-10-CM

## 2014-11-26 NOTE — Patient Instructions (Signed)
Add to theraputty exercises: Make sure you are also doing the first exercise which works on grip.  Do these 2-3 times per day.  STOP if you get pain and let your therapist know.  1. Make a ball in your hand. 2. Flatten into a pancake first by using your palm then by using your fingers to push and pull to make it flat. 3. Using fingers pull into cone shape.  Repeat whole cycle 5 times.  Supination stretch: Sit at table.  Turn palm up toward ceiling as far as you can go. Do not turn elbow in or use shoulder or trunk to turn it over.  Use left hand to passively turn it further. You should feel a stretch but no true pain. Do 10 times twice a day.

## 2014-11-26 NOTE — Therapy (Signed)
[facility] [address] [place], [place], [address] Phone: [phone]   Fax:  [phone]  Occupational Therapy Treatment  Patient Details  Name: Tiffany Carson MRN: [id number] Date of Birth: 1965/11/13 Referring Provider:  Burnis Medin, MD  Encounter Date: 11/26/2014      OT End of Session - 11/26/14 1704    Visit Number 3   Number of Visits 9   Date for OT Re-Evaluation 12/14/14   Authorization Type [facility]   OT Start Time 1615   OT Stop Time 1656   OT Time Calculation (min) 41 min   Activity Tolerance Patient tolerated treatment well      Past Medical History  Diagnosis Date  . FIBROIDS, UTERUS 09/03/2010  . VITAMIN D DEFICIENCY 06/02/2010  . HYPERLIPIDEMIA 08/08/2007  . OBESITY 06/24/2009  . ANEMIA, IRON DEFICIENCY 07/02/2010  . GERD 08/08/2007  . Irregular menstrual cycle 06/24/2009  . LEG PAIN, RIGHT 10/03/2010  . LEG CRAMPS 06/24/2009  . SLEEPLESSNESS 11/16/2007  . FATIGUE 06/24/2009  . TREMOR 07/02/2010  . TINGLING 10/03/2010  . COUGH, CHRONIC 04/09/2010  . ABDOMINAL PAIN RIGHT LOWER QUADRANT 10/03/2010  . Acquired absence of both cervix and uterus 10/03/2010    Past Surgical History  Procedure Laterality Date  . Cholecystectomy    . Oophorectomy      left   salpingesctomy   . Abdominal hysterectomy  nov 2011    secondary infection  had adhesions  . Breast reduction surgery  09 20 12  . Breast surgery      There were no vitals taken for this visit.  Visit Diagnosis:  Right hand weakness  Occasional tremors      Subjective Assessment - 11/26/14 1624    Symptoms I didn't realize I had lost so much strength in my hand   Pertinent History see epic snapshot. Pt had botox to decrease intermittent tremor 09/19/2014.  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 No/denies                  OT Treatments/Exercises (OP) - 11/26/14 0001    Exercises   Exercises Hand   Hand Exercises   Theraputty Flatten;Roll;Grip  red putty;  updated HEP with three additonal exercises   Hand Gripper with Small Beads 55 pounds of resistance   Modalities   Modalities Moist Heat  simultaneously with education regarding upgraded HEP   Moist Heat Therapy   Number Minutes Moist Heat 8 Minutes   Moist Heat Location Other (comment)  R forearm to decrease tightness in supin/wrist extension   Manual Therapy   Manual Therapy Myofascial release;Joint mobilization  to reduce tightness in forearm; see upgraded HEP                OT Education - 11/26/14 1704    Education provided Yes   Education Details upgraded HEP   Person(s) Educated Patient   Methods Explanation;Demonstration;Verbal cues;Handout;Tactile cues   Comprehension Verbalized understanding;Returned demonstration             OT Long Term Goals - 11/26/14 1706    OT LONG TERM GOAL #1   Title Independent w/ HEP for coordination and grip strength (due 11/14/14)   Time 4   Period Weeks   Status On-going   OT LONG TERM GOAL #2  Title Improve Rt grip strength to 45 lbs or greater (initial eval = 40 lbs, Lt = 55 lbs)   Time 4   Period Weeks   Status On-going   OT LONG TERM GOAL #3   Title Improve handwriting to consistently 75% legibility after 5 min. or writing w/ A/E and/or compensatory strategies/adaptations prn   Baseline Currently b/t 50% - 75%   Time 4   Period Weeks   Status On-going   OT LONG TERM GOAL #4   Title Pt to verbalize understanding w/ A/E and compensatory strategies/adaptations for writing and other tasks that are difficulty   Time 4   Period Weeks   Status On-going               Plan - 11/26/14 1705    Clinical Impression Statement Pt tolerating HEP well - upgraded HEP to advance pt. Pt able to return demonstrate and in agreement.   Rehab Potential Fair    Clinical Impairments Affecting Rehab Potential Chronic in nature and unsure whether therapy will make a change in physical impairments, however would benefit from O.T. to strengthen forearm, wrist and hand musculature and to train in compensatory strategies and A/E recommendations   OT Frequency 2x / week   OT Duration 4 weeks   OT Treatment/Interventions Self-care/ADL training;Fluidtherapy;DME and/or AE instruction;Splinting;Patient/family education;Therapeutic exercises;Therapeutic activities;Neuromuscular education;Passive range of motion;Parrafin;Energy conservation;Manual Therapy   Plan check HEP;  strengthening of hand   Consulted and Agree with Plan of Care Patient        Problem List Patient Active Problem List   Diagnosis Date Noted  . Snoring 04/09/2014  . Swelling of joint of right knee 03/30/2014  . Insomnia, persistent 02/19/2014  . Primary snoring 02/19/2014  . Focal dystonia 02/19/2014  . Task-specific dystonia of hand 02/19/2014  . Hand pain, right 12/04/2013  . Visit for preventive health examination 01/09/2013  . Medial knee pain 11/30/2012  . Gynecomastia, female 02/20/2011  . Neck pain 02/20/2011  . ACQUIRED ABSENCE OF BOTH CERVIX AND UTERUS 10/03/2010  . TREMOR 07/02/2010  . VITAMIN D DEFICIENCY 06/02/2010  . OBESITY 06/24/2009  . SLEEPLESSNESS 11/16/2007  . GERD 08/08/2007    Quay Burow, OTR/L 11/26/2014, 5:08 PM  [facility] [address] [place], [place], [address] Phone: [phone]   Fax:  [phone]

## 2014-12-03 ENCOUNTER — Ambulatory Visit: Payer: 59 | Attending: Neurology | Admitting: Occupational Therapy

## 2014-12-03 ENCOUNTER — Encounter: Payer: Self-pay | Admitting: Occupational Therapy

## 2014-12-03 DIAGNOSIS — R251 Tremor, unspecified: Secondary | ICD-10-CM

## 2014-12-03 DIAGNOSIS — M6289 Other specified disorders of muscle: Secondary | ICD-10-CM | POA: Diagnosis not present

## 2014-12-03 DIAGNOSIS — G249 Dystonia, unspecified: Secondary | ICD-10-CM | POA: Diagnosis not present

## 2014-12-03 DIAGNOSIS — R258 Other abnormal involuntary movements: Secondary | ICD-10-CM | POA: Diagnosis not present

## 2014-12-03 DIAGNOSIS — R29898 Other symptoms and signs involving the musculoskeletal system: Secondary | ICD-10-CM

## 2014-12-03 NOTE — Therapy (Signed)
[facility] [address] [place], [place], [address] Phone: [phone]   Fax:  [phone]  Occupational Therapy Treatment  Patient Details  Name: Tiffany Carson MRN: [id number] Date of Birth: 1966-02-18 Referring Provider:  Burnis Medin, MD  Encounter Date: 12/03/2014      OT End of Session - 12/03/14 1705    Visit Number 4   Number of Visits 9   Date for OT Re-Evaluation 12/14/14   Authorization Type [facility]   OT Start Time 1616   OT Stop Time 1701   OT Time Calculation (min) 45 min   Activity Tolerance Patient tolerated treatment well      Past Medical History  Diagnosis Date  . FIBROIDS, UTERUS 09/03/2010  . VITAMIN D DEFICIENCY 06/02/2010  . HYPERLIPIDEMIA 08/08/2007  . OBESITY 06/24/2009  . ANEMIA, IRON DEFICIENCY 07/02/2010  . GERD 08/08/2007  . Irregular menstrual cycle 06/24/2009  . LEG PAIN, RIGHT 10/03/2010  . LEG CRAMPS 06/24/2009  . SLEEPLESSNESS 11/16/2007  . FATIGUE 06/24/2009  . TREMOR 07/02/2010  . TINGLING 10/03/2010  . COUGH, CHRONIC 04/09/2010  . ABDOMINAL PAIN RIGHT LOWER QUADRANT 10/03/2010  . Acquired absence of both cervix and uterus 10/03/2010    Past Surgical History  Procedure Laterality Date  . Cholecystectomy    . Oophorectomy      left   salpingesctomy   . Abdominal hysterectomy  nov 2011    secondary infection  had adhesions  . Breast reduction surgery  09 20 12  . Breast surgery      There were no vitals taken for this visit.  Visit Diagnosis:  Right hand weakness  Occasional tremors  Task-specific dystonia of hand      Subjective Assessment - 12/03/14 1621    Symptoms Activitie at home have been going well.   Pertinent History see epic snapshot. Pt had botox to decrease intermittent tremor 09/19/2014.  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  No/denies  tightness in  upper arm due to compensation for supination                 OT Treatments/Exercises (OP) - 12/03/14 0001    ADLs   Writing Practiced writing without AE - pt states she does not like to use anything that increases the size as she feels she has to work harder to control coordination with writing.  Pt is aware of AE options and feels she does better without it. Discussed using soft pencil grip and pt willing to try - loaned to pt so she can use between now and next visit.    Exercises   Exercises Hand   Hand Exercises   Theraputty Flatten;Roll;Grip;Pinch;Locate Pegs  20 pegs in blue theraputty    Hand Gripper with Small Beads stacking blocks using gripper with 55 pounds of resistance   Other Hand Exercises Supination AAROM to increase ROM for supination. Pt needs min vc's to decrease compensations (IR at shoulder and substitution of trunk)  Recommended pt complete exercises in front of mirror.                      OT Long Term Goals - 12/03/14 1706    OT LONG TERM GOAL #1   Title Independent w/ HEP for coordination and grip strength (due 11/14/14)  Time 4   Period Weeks   Status On-going   OT LONG TERM GOAL #2   Title Improve Rt grip strength to 45 lbs or greater (initial eval = 40 lbs, Lt = 55 lbs)   Time 4   Period Weeks   Status On-going   OT LONG TERM GOAL #3   Title Improve handwriting to consistently 75% legibility after 5 min. or writing w/ A/E and/or compensatory strategies/adaptations prn   Baseline Currently b/t 50% - 75%   Time 4   Period Weeks   Status On-going   OT LONG TERM GOAL #4   Title Pt to verbalize understanding w/ A/E and compensatory strategies/adaptations for writing and other tasks that are difficulty   Time 4   Period Weeks   Status On-going               Plan - 12/03/14 1655    Clinical Impression Statement Pt states she feels botox injection is helping and feels exercises are strengthening her  hand.   Rehab Potential Fair   Clinical Impairments Affecting Rehab Potential Chronic in nature and unsure whether therapy will make a change in physical impairments, however would benefit from O.T. to strengthen forearm, wrist and hand musculature and to train in compensatory strategies and A/E recommendations   OT Frequency 2x / week   OT Duration 4 weeks   OT Treatment/Interventions Self-care/ADL training;Fluidtherapy;DME and/or AE instruction;Splinting;Patient/family education;Therapeutic exercises;Therapeutic activities;Neuromuscular education;Passive range of motion;Parrafin;Energy conservation;Manual Therapy   Plan wrist stabilization, wrist strengthening. Check with pt to see if AE for writing was beneficial.    Consulted and Agree with Plan of Care Patient        Problem List Patient Active Problem List   Diagnosis Date Noted  . Snoring 04/09/2014  . Swelling of joint of right knee 03/30/2014  . Insomnia, persistent 02/19/2014  . Primary snoring 02/19/2014  . Focal dystonia 02/19/2014  . Task-specific dystonia of hand 02/19/2014  . Hand pain, right 12/04/2013  . Visit for preventive health examination 01/09/2013  . Medial knee pain 11/30/2012  . Gynecomastia, female 02/20/2011  . Neck pain 02/20/2011  . ACQUIRED ABSENCE OF BOTH CERVIX AND UTERUS 10/03/2010  . TREMOR 07/02/2010  . VITAMIN D DEFICIENCY 06/02/2010  . OBESITY 06/24/2009  . SLEEPLESSNESS 11/16/2007  . GERD 08/08/2007    Quay Burow, OTR/L 12/03/2014, 5:07 PM  [facility] [address] [place], [place], [address] Phone: [phone]   Fax:  [phone]

## 2014-12-10 ENCOUNTER — Ambulatory Visit: Payer: 59 | Admitting: Occupational Therapy

## 2014-12-17 ENCOUNTER — Ambulatory Visit: Payer: 59 | Admitting: Occupational Therapy

## 2014-12-24 ENCOUNTER — Ambulatory Visit: Payer: 59 | Admitting: Occupational Therapy

## 2014-12-24 DIAGNOSIS — R29898 Other symptoms and signs involving the musculoskeletal system: Secondary | ICD-10-CM

## 2014-12-24 DIAGNOSIS — R251 Tremor, unspecified: Secondary | ICD-10-CM

## 2014-12-24 DIAGNOSIS — M6289 Other specified disorders of muscle: Secondary | ICD-10-CM | POA: Diagnosis not present

## 2014-12-24 NOTE — Patient Instructions (Signed)
Pt issued green theraputty to allow her to upgrade her home program as hand strength improves.    Ways to upgrade theraputty home program:  1. Slowly change from red to green putty. 2.  Increase reps3.  Increase number of sessions per day.    Coordination Activities  Perform the following activities for 15-20  Minutes 1-2 times per day with right hand(s).  Rotate ball in fingertips (clockwise and counter-clockwise). Toss ball between hands. Toss ball in air and catch with the same hand. Flip cards 1 at a time as fast as you can. Deal cards with your thumb (Hold deck in hand and push card off top with thumb). Rotate card in hand (clockwise and counter-clockwise). Shuffle cards. Pick up coins, buttons, marbles, dried beans/pasta of different sizes and place in container. Pick up coins and place in container or coin bank. Pick up coins and stack. Practice writing and/or typing. Screw together nuts and bolts, then unfasten.   Ways to increase challenge of coordination activities:  1. Choose smaller objects to manipulate 2. Increase the amount of time per day or increase number of sessions per day 3. Increasing speed of the activities while maintaining accuracy.  Increase using one parameter - do not increase multiple things on the same day. You can vary day to day what you increase.

## 2014-12-24 NOTE — Therapy (Signed)
[facility] [address] [place], [place], [address] Phone: [phone]   Fax:  [phone]  Occupational Therapy Treatment  Patient Details  Name: Tiffany Carson MRN: [id number] Date of Birth: 07/01/1966 Referring Provider:  Burnis Medin, MD  Encounter Date: 12/24/2014      OT End of Session - 12/24/14 1655    Visit Number 5   Number of Visits 9   Date for OT Re-Evaluation 12/24/14  pt missed multiple weeks therefore date adjusted   Authorization Type [facility]   OT Start Time 1615   OT Stop Time 1645   OT Time Calculation (min) 30 min   Activity Tolerance Patient tolerated treatment well      Past Medical History  Diagnosis Date  . FIBROIDS, UTERUS 09/03/2010  . VITAMIN D DEFICIENCY 06/02/2010  . HYPERLIPIDEMIA 08/08/2007  . OBESITY 06/24/2009  . ANEMIA, IRON DEFICIENCY 07/02/2010  . GERD 08/08/2007  . Irregular menstrual cycle 06/24/2009  . LEG PAIN, RIGHT 10/03/2010  . LEG CRAMPS 06/24/2009  . SLEEPLESSNESS 11/16/2007  . FATIGUE 06/24/2009  . TREMOR 07/02/2010  . TINGLING 10/03/2010  . COUGH, CHRONIC 04/09/2010  . ABDOMINAL PAIN RIGHT LOWER QUADRANT 10/03/2010  . Acquired absence of both cervix and uterus 10/03/2010    Past Surgical History  Procedure Laterality Date  . Cholecystectomy    . Oophorectomy      left   salpingesctomy   . Abdominal hysterectomy  nov 2011    secondary infection  had adhesions  . Breast reduction surgery  09 20 12  . Breast surgery      There were no vitals taken for this visit.  Visit Diagnosis:  Right hand weakness  Occasional tremors               OT Treatments/Exercises (OP) - 12/24/14 0001    ADLs   Writing Pt met goal of writing for 5 minutes with improved legibility (now 100% legible) with AE. Pt reports her handwriting has definitely improved. Discussed awareness of posture, supporting the UE in weight bearing, and taking short stretch breaks with hand  when taking notes in class. Pt able to verbalize and demonstrate improved posture.   Exercises   Exercises Hand   Hand Exercises   Other Hand Exercises Reviewed, practiced and provided in writing how to increase challenge of HEP for both strengthening and coordination. Pt exceeded R grip strength goal and has improved by 15 pounds of grip strength in R hand.  Pt able to verbalize understanding.                 OT Education - 12/24/14 1654    Education provided Yes   Education Details how to upgrade HEP on her own   Person(s) Educated Patient   Methods Explanation;Demonstration;Handout   Comprehension Verbalized understanding;Returned demonstration             OT Long Term Goals - 12/24/14 1622    OT LONG TERM GOAL #1   Title Independent w/ HEP for coordination and grip strength (due 12/24/2014 as pt missed several weeks;  date moved foward to reflect this)   Time 4   Period Weeks   Status Achieved   OT LONG TERM GOAL #2   Title Improve Rt grip strength to 45 lbs or greater (initial eval = 40 lbs, Lt = 55 lbs)   Time 4   Period Weeks   Status Achieved  R= 55 pounds   OT  LONG TERM GOAL #3   Title Improve handwriting to consistently 75% legibility after 5 min. or writing w/ A/E and/or compensatory strategies/adaptations prn  100% legilble using AE   Baseline Currently b/t 50% - 75%   Time 4   Period Weeks   Status Achieved   OT LONG TERM GOAL #4   Title Pt to verbalize understanding w/ A/E and compensatory strategies/adaptations for writing and other tasks that are difficulty   Time 4   Period Weeks   Status Achieved               Plan - 12/24/14 1656    Clinical Impression Statement Pt has met or exceeded all LTG's. Pt knowlegeable about how to continue to increase challenge of HEP.   Rehab Potential Fair   Clinical Impairments Affecting Rehab Potential Chronic in nature and unsure whether therapy will make a change in physical impairments, however  would benefit from O.T. to strengthen forearm, wrist and hand musculature and to train in compensatory strategies and A/E recommendations   OT Frequency 2x / week   OT Duration 4 weeks   OT Treatment/Interventions Self-care/ADL training;Fluidtherapy;DME and/or AE instruction;Splinting;Patient/family education;Therapeutic exercises;Therapeutic activities;Neuromuscular education;Passive range of motion;Parrafin;Energy conservation;Manual Therapy   Plan D/C from OT services as of today   Consulted and Agree with Plan of Care Patient        Problem List Patient Active Problem List   Diagnosis Date Noted  . Snoring 04/09/2014  . Swelling of joint of right knee 03/30/2014  . Insomnia, persistent 02/19/2014  . Primary snoring 02/19/2014  . Focal dystonia 02/19/2014  . Task-specific dystonia of hand 02/19/2014  . Hand pain, right 12/04/2013  . Visit for preventive health examination 01/09/2013  . Medial knee pain 11/30/2012  . Gynecomastia, female 02/20/2011  . Neck pain 02/20/2011  . ACQUIRED ABSENCE OF BOTH CERVIX AND UTERUS 10/03/2010  . TREMOR 07/02/2010  . VITAMIN D DEFICIENCY 06/02/2010  . OBESITY 06/24/2009  . SLEEPLESSNESS 11/16/2007  . GERD 08/08/2007   OCCUPATIONAL THERAPY DISCHARGE SUMMARY  Visits from Start of Care: 5  Current functional level related to goals / functional outcomes: See above goal status   Remaining deficits: Tremor of RUE   Education / Equipment: HEP Plan: Patient agrees to discharge.  Patient goals were partially met. Patient is being discharged due to meeting the stated rehab goals.  ?????      Quay Burow, OTR/L 12/24/2014, 4:58 PM  [facility] [address] [place], [place], [address] Phone: [phone]   Fax:  [phone]

## 2014-12-26 ENCOUNTER — Ambulatory Visit (INDEPENDENT_AMBULATORY_CARE_PROVIDER_SITE_OTHER): Payer: Commercial Managed Care - PPO | Admitting: Neurology

## 2014-12-26 ENCOUNTER — Encounter: Payer: Self-pay | Admitting: Neurology

## 2014-12-26 ENCOUNTER — Encounter: Payer: Self-pay | Admitting: *Deleted

## 2014-12-26 VITALS — BP 124/78 | HR 66 | Ht 63.0 in | Wt 174.0 lb

## 2014-12-26 DIAGNOSIS — F488 Other specified nonpsychotic mental disorders: Secondary | ICD-10-CM | POA: Insufficient documentation

## 2014-12-26 DIAGNOSIS — G2589 Other specified extrapyramidal and movement disorders: Secondary | ICD-10-CM

## 2014-12-26 MED ORDER — ONABOTULINUMTOXINA 100 UNITS IJ SOLR
100.0000 [IU] | Freq: Once | INTRAMUSCULAR | Status: AC
  Administered 2014-12-26: 100 [IU] via INTRAMUSCULAR

## 2014-12-26 NOTE — Progress Notes (Signed)
PATIENT: Tiffany Carson  HISTORICAL  Tiffany Carson is a 49 years old right-handed female, referred by Dr. Brett Fairy for evaluation of potential EMG guided injection for her right hand dystonia  She had a past medical history of chronic insomnia, is under the care of Dr. Brett Fairy, taking Ambien CR 12.5 mg every night, also recently diagnosed with ADHD, taking Vyvance.  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  She has tried propranolol for one year 60 mg daily, without helping her symptoms, recently she was started on Sinemet 25/100, after 2 tablets 3 times a day without helping her symptoms as well, sometimes has GI side effect of nausea.  She never had a good sense of smell, she denies REM sleep disorder, not memory loss, no gait difficulty, no orthostatic dizziness, chronic constipation  Update September 19 2014  She came in for her first electrical stimulation guided 50 units Botox injection for her right hand dystonia, potential side effect explained, she voiced understanding  UPDATE Dec 8th 2015:  follow-up her most recent Botox injection in September 19 2014, for her right writer's cramp, she reported moderate improvement, she can hold her pen better, but continue to struggle with writing, no difficulty with typing,  starting IV, and the other tasks require using right hand,  She did not notice any side effect  UPDATE Feb 24th 2016:  She had her first EMG guided Botox injection in Nov 18th 2015,   ROS: Full 14 system review of systems performed and notable only for As above   ALLERGIES: Allergies  Allergen Reactions  . Dexamethasone     Face turned a little bit red after taking a dose of this for poison ivy.    HOME MEDICATIONS: Current Outpatient Prescriptions on File Prior to Visit  Medication Sig Dispense Refill  . ibuprofen (ADVIL,MOTRIN) 200 MG tablet Take 200 mg by mouth every 6 (six) hours as needed.      Marland Kitchen lisdexamfetamine (VYVANSE) 30 MG capsule Take 30 mg by mouth daily.    . meloxicam (MOBIC) 15 MG tablet TAKE 1 TABLET BY MOUTH ONCE DAILY 30 tablet 2  . omeprazole (PRILOSEC) 20 MG capsule Take 20 mg by mouth as needed.    . Vitamin D, Cholecalciferol, 400 UNITS CHEW Chew by mouth.     . zolpidem (AMBIEN CR) 12.5 MG CR tablet TAKE 1 TABLET BY MOUTH AT BEDTIME AS NEEDED FOR SLEEP 30 tablet 5   No current facility-administered medications on file prior to visit.    PAST MEDICAL HISTORY: Past Medical History  Diagnosis Date  . FIBROIDS, UTERUS 09/03/2010  . VITAMIN D DEFICIENCY 06/02/2010  . HYPERLIPIDEMIA 08/08/2007  . OBESITY 06/24/2009  . ANEMIA, IRON DEFICIENCY 07/02/2010  . GERD 08/08/2007  . Irregular menstrual cycle 06/24/2009  . LEG PAIN, RIGHT 10/03/2010  . LEG CRAMPS 06/24/2009  . SLEEPLESSNESS  11/16/2007  . FATIGUE 06/24/2009  . TREMOR 07/02/2010  . TINGLING 10/03/2010  . COUGH, CHRONIC 04/09/2010  . ABDOMINAL PAIN RIGHT LOWER QUADRANT 10/03/2010  . Acquired absence of both cervix and uterus 10/03/2010    PAST SURGICAL HISTORY: Past Surgical History  Procedure Laterality Date  . Cholecystectomy    . Oophorectomy      left   salpingesctomy   . Abdominal hysterectomy  nov 2011    secondary infection  had adhesions  . Breast reduction surgery  09 20 12  . Breast surgery        FAMILY HISTORY: Family History  Problem Relation Age of Onset  . Arthritis    . Hypertension    . Diabetes Mother   . Hypertension Mother   . Diabetes Brother 52    2013, type 2    SOCIAL HISTORY:  History   Social History  . Marital Status: Single    Spouse Name: n/a  . Number of Children: 0  . Years of Education: 16   Occupational History  . NUCLEAR MEDICINE     [employer]   Social History Main Topics  . Smoking status: Never Smoker   . Smokeless tobacco: Never Used  . Alcohol Use: 0.0 - 0.6 oz/week    0-1 Standard drinks or equivalent per week     Comment: rarely  . Drug Use: No  . Sexual Activity: No   Other Topics Concern  . Not on file   Social History Narrative   Lives alone, with 1 dog.   Cousins live nearby.  Siblings are in middle [place].   Neg td  ocass etoh 1 caffeine per day    40 hours  Per week.    Patient is right-handed.   Patient has a Haematologist.              PHYSICAL EXAM   Filed Vitals:   12/26/14 1510  BP: 124/78  Pulse: 66  Height: 5\' 3"  (1.6 m)  Weight: 174 lb (78.926 kg)    Not recorded      Body mass index is 30.83 kg/(m^2).   Generalized: In no acute distress  Neck: Supple, no carotid bruits   Cardiac: Regular rate rhythm  Pulmonary: Clear to auscultation bilaterally  Musculoskeletal: No deformity  Neurological examination  Mentation: Alert oriented to time, place, history taking, and causual conversation  Cranial nerve II-XII: Pupils were equal round reactive to light. Extraocular movements were full.  Visual field were full on confrontational test. Bilateral fundi were sharp.  Facial sensation and strength were normal. Hearing was intact to finger rubbing bilaterally. Uvula tongue midline.  Head turning and shoulder shrug and were normal and symmetric.Tongue protrusion into cheek strength was normal.  Motor: Normal tone, bulk and strength. She has no significant rigidity,  large amplitude right hand tremor while she writing, improved  tendency to have excessive right wrist extension, grip pen very tightly with her right index, and thumb, with right forearm pronation/supranation  Sensory: Intact to fine touch, pinprick, preserved vibratory sensation, and proprioception at toes.  Coordination: Normal finger to nose, heel-to-shin bilaterally there was no truncal ataxia  Gait: Rising up from seated position without assistance, normal stance, without trunk ataxia, moderate stride, good arm swing, smooth turning, able to perform tiptoe, and heel walking without difficulty.   Romberg signs: Negative  Deep tendon reflexes: Brachioradialis 2/2, biceps 2/2, triceps 2/2, patellar 2/2, Achilles 2/2, plantar responses were flexor bilaterally.  DIAGNOSTIC DATA (LABS, IMAGING, TESTING) - I reviewed patient records, labs, notes, testing and imaging myself where available.  Lab Results  Component Value Date   WBC 6.8 12/26/2012   HGB 15.6* 12/26/2012   HCT 46.0 12/26/2012   MCV 90.7 12/26/2012   PLT 425.0* 12/26/2012      Component Value Date/Time   NA 139 12/26/2012 0952   K 4.1 12/26/2012 0952   CL 105 12/26/2012 0952   CO2 25 12/26/2012 0952   GLUCOSE 98 12/26/2012 0952   BUN 8 12/26/2012 0952   CREATININE 0.7 12/26/2012 0952   CALCIUM 9.2 12/26/2012 0952   PROT 7.1 12/26/2012 0952   ALBUMIN 4.2 12/26/2012 0952   AST 20 12/26/2012 0952   ALT 20 12/26/2012 0952   ALKPHOS 58 12/26/2012 0952   BILITOT 0.7 12/26/2012 0952   GFRNONAA 100.56 10/03/2010 0000   Lab Results  Component Value Date   CHOL 181 12/26/2012   HDL 55.20 12/26/2012   LDLCALC 113* 12/26/2012   LDLDIRECT 135.4 06/27/2012   TRIG 62.0 12/26/2012   CHOLHDL 3 12/26/2012   Lab Results  Component Value Date   HGBA1C 5.7 06/27/2012   Lab Results  Component Value Date   VITAMINB12 488 10/17/2010   Lab Results  Component Value Date   TSH 1.28 12/26/2012    ASSESSMENT AND  PLAN  JAMIAYA BINA is a 49 y.o. female  with right hand dystonia while writing, consistent with writer's cramp, tried and failed propanolol 60 mg daily, Sinemet 25/100 mg 2 tablets 3 times a day  She came in for the first electrical stimulation guided Botox injection  for her right hand writer's cramp, first injection September 19 2014, she received 50 units, reported significant improvement, no significant side effect,  Electrical stimulation guided botulism toxin injection for right writer's hand,  Right pronator teres 12.5 units Right extensor carpi radialis longus 12.5 Flexor digitorum superficialis 12.5 Right brachial radialis 12.5.   Right brachialis 25 Right levator scapular 12.5 Right pectoralis major 12.5.  Continue injection in 3 months 100 units,    Marcial Pacas, M.D. Ph.D.  [facility] [address], [place], [place] [address] [phone]

## 2014-12-31 ENCOUNTER — Ambulatory Visit: Payer: 59 | Admitting: Occupational Therapy

## 2015-02-26 ENCOUNTER — Other Ambulatory Visit: Payer: Self-pay | Admitting: Neurology

## 2015-02-27 ENCOUNTER — Other Ambulatory Visit: Payer: Self-pay

## 2015-02-27 MED ORDER — ZOLPIDEM TARTRATE ER 12.5 MG PO TBCR: 12.5000 mg | EXTENDED_RELEASE_TABLET | Freq: Every evening | ORAL | Status: DC | PRN

## 2015-03-01 ENCOUNTER — Telehealth: Payer: Self-pay | Admitting: Neurology

## 2015-03-01 NOTE — Telephone Encounter (Signed)
I called the patient. The patient needs a refill on her Ambien prescription. The prescription was written 2 days ago, but apparently the pharmacy does not yet have the medication. I called the medication in.

## 2015-03-01 NOTE — Telephone Encounter (Signed)
I got a call from the patient. She needs a refill on her Ambien. In the computer, it looks like the prescription was written 2 days ago. I will call the pharmacy to ensure that they had the prescription.

## 2015-03-11 ENCOUNTER — Other Ambulatory Visit: Payer: Self-pay

## 2015-03-11 DIAGNOSIS — Z1231 Encounter for screening mammogram for malignant neoplasm of breast: Secondary | ICD-10-CM

## 2015-04-08 ENCOUNTER — Ambulatory Visit: Payer: 59

## 2015-04-26 ENCOUNTER — Other Ambulatory Visit: Payer: Self-pay | Admitting: Neurology

## 2015-04-26 MED ORDER — ZOLPIDEM TARTRATE ER 12.5 MG PO TBCR: 12.5000 mg | EXTENDED_RELEASE_TABLET | Freq: Every evening | ORAL | Status: DC | PRN

## 2015-04-26 NOTE — Telephone Encounter (Addendum)
Request entered, forwarded to provider for approval.   I have also called the pharmacy and spoken with Aaron Edelman.

## 2015-04-26 NOTE — Telephone Encounter (Signed)
Patient is calling to get a Rx for zolpidem (AMBIEN CR) 12.5 MG CR tablet called to [facility]. The pharmacy states request was sent yesterday. The patient is going out of town today and is completely out of the medication. Thank you.

## 2015-04-29 ENCOUNTER — Other Ambulatory Visit: Payer: Self-pay | Admitting: *Deleted

## 2015-04-29 MED ORDER — ZOLPIDEM TARTRATE ER 12.5 MG PO TBCR: 12.5000 mg | EXTENDED_RELEASE_TABLET | Freq: Every evening | ORAL | Status: DC | PRN

## 2015-04-29 NOTE — Telephone Encounter (Signed)
Dr. Brett Fairy printed rx. on 04-26-15, but didn't sign it, and she is not in the office today, so rx. reprinted under Dr. Garth Bigness name, as he is the w/i doc today/fim

## 2015-05-02 ENCOUNTER — Telehealth: Payer: Self-pay

## 2015-05-02 NOTE — Telephone Encounter (Signed)
LVM for patient to call office to discuss Botox.

## 2015-05-28 ENCOUNTER — Other Ambulatory Visit: Payer: Self-pay

## 2015-05-28 MED ORDER — ONABOTULINUMTOXINA 100 UNITS IJ SOLR: INTRAMUSCULAR | Status: DC

## 2015-05-28 NOTE — Telephone Encounter (Signed)
Per Larey Seat, patient will get Botox through [facility] and bring the vial to appt.

## 2015-06-06 ENCOUNTER — Telehealth: Payer: Self-pay

## 2015-06-06 NOTE — Telephone Encounter (Signed)
Explained to patient PA needed for botox injections now since office policy is to use speciality pharmacy. Advised pt  PA process has started and per insurance there is a 14 day turn around. Cancld upcoming appt on 06/06/2015. Patient verbalized understanding. She is hoping to schedule injections on the next available Monday. Patient has an appt w/ Dr. Brett Fairy on 06/17/15 also.

## 2015-06-10 ENCOUNTER — Ambulatory Visit: Payer: Commercial Managed Care - PPO | Admitting: Neurology

## 2015-06-12 NOTE — Telephone Encounter (Signed)
Returned patient's call. Advised prior auth still in review.  Advised will contact with appt availability once Botox approved. Patient agreed.  Spoke to [employer] to confirm auth recvd previously. Pharmacy Benefit Manger confirmed.

## 2015-06-12 NOTE — Telephone Encounter (Signed)
[employer], Authorization for Botox needs to be done through [employer] [phone], fax# [phone] re# [id number]

## 2015-06-17 ENCOUNTER — Encounter: Payer: Self-pay | Admitting: Neurology

## 2015-06-17 ENCOUNTER — Ambulatory Visit (INDEPENDENT_AMBULATORY_CARE_PROVIDER_SITE_OTHER): Payer: 59 | Admitting: Neurology

## 2015-06-17 VITALS — BP 132/70 | HR 86 | Resp 20 | Ht 62.0 in | Wt 180.0 lb

## 2015-06-17 DIAGNOSIS — F5105 Insomnia due to other mental disorder: Secondary | ICD-10-CM | POA: Diagnosis not present

## 2015-06-17 DIAGNOSIS — G241 Genetic torsion dystonia: Secondary | ICD-10-CM

## 2015-06-17 DIAGNOSIS — E669 Obesity, unspecified: Secondary | ICD-10-CM | POA: Insufficient documentation

## 2015-06-17 DIAGNOSIS — F489 Nonpsychotic mental disorder, unspecified: Secondary | ICD-10-CM

## 2015-06-17 MED ORDER — ZOLPIDEM TARTRATE ER 12.5 MG PO TBCR: 12.5000 mg | EXTENDED_RELEASE_TABLET | Freq: Every evening | ORAL | Status: DC | PRN

## 2015-06-17 NOTE — Patient Instructions (Signed)
Zolpidem extended-release tablets What is this medicine? ZOLPIDEM (zole PI dem) is used to treat insomnia. This medicine helps you to fall asleep and sleep through the night. This medicine may be used for other purposes; ask your health care provider or pharmacist if you have questions. COMMON BRAND NAME(S): Ambien CR What should I tell my health care provider before I take this medicine? They need to know if you have any of these conditions: -depression -history of a drug or alcohol abuse problem -liver disease -lung or breathing disease -suicidal thoughts -an unusual or allergic reaction to zolpidem, other medicines, foods, dyes, or preservatives -pregnant or trying to get pregnant -breast-feeding How should I use this medicine? Take this medicine by mouth with a glass of water. Follow the directions on the prescription label. Do not crush, split, or chew the tablet before swallowing. It is better to take this medicine on an empty stomach and only when you are ready for bed. Do not take your medicine more often than directed. If you have been taking this medicine for several weeks and suddenly stop taking it, you may get unpleasant withdrawal symptoms. Your doctor or health care professional may want to gradually reduce the dose. Do not stop taking this medicine on your own. Always follow your doctor or health care professional's advice. A special MedGuide will be given to you by the pharmacist with each prescription and refill. Be sure to read this information carefully each time. Talk to your pediatrician regarding the use of this medicine in children. Special care may be needed. Overdosage: If you think you have taken too much of this medicine contact a poison control center or emergency room at once. NOTE: This medicine is only for you. Do not share this medicine with others. What if I miss a dose? This does not apply. This medicine should only be taken immediately before going to sleep.  Do not take double or extra doses. What may interact with this medicine? -herbal medicines like kava kava, melatonin, St. John's wort and valerian -medicines for fungal infections like ketoconazole, fluconazole, or itraconazole -medicines for treating depression or other mental problems -other medicines given for sleep -some medicines for Parkinson' s disease or other movement disorders -some medicines used to treat HIV infection or AIDS, like ritonavir This list may not describe all possible interactions. Give your health care provider a list of all the medicines, herbs, non-prescription drugs, or dietary supplements you use. Also tell them if you smoke, drink alcohol, or use illegal drugs. Some items may interact with your medicine. What should I watch for while using this medicine? Visit your doctor or health care professional for regular checks on your progress. Keep a regular sleep schedule by going to bed at about the same time each night. Avoid caffeine-containing drinks in the evening hours. When sleep medicines are used every night for more than a few weeks, they may stop working. Talk to your doctor if your insomnia worsens or is not better within 7 to 10 days. You may not be able to remember things that you do in the hours after you take this medicine. Some people have reported driving, making phone calls, or preparing and eating food while asleep after taking sleep medicine. Take this medicine right before going to sleep. Tell your doctor if you are having any problems with your memory. After you stop taking this medicine, you may have trouble falling asleep. This is called rebound insomnia. This problem usually goes away on its  own after 1 or 2 nights. Do not take this medicine unless you are able to stay in bed for a full night (7 to 8 hours) before you must be active again. Do not drive, use machinery, or do anything that needs mental alertness the day after you take this medicine. You  may have a decrease in mental alertness the day after use, even if you feel that you are fully awake. Tell your doctor if you will need to perform activities requiring full alertness, such as driving, the next day. You may get drowsy or dizzy. Do not stand or sit up quickly, especially if you are an older patient. This reduces the risk of dizzy or fainting spells. Alcohol may interfere with the effect of this medicine. Avoid alcoholic drinks. If you or your family notice any changes in your behavior, or if you have any unusual or disturbing thoughts, call your doctor right away. What side effects may I notice from receiving this medicine? Side effects that you should report to your doctor or health care professional as soon as possible: -allergic reactions like skin rash, itching or hives, swelling of the face, lips, or tongue -changes in vision -confusion -depressed mood -feeling faint or lightheaded, falls -hallucinations -problems with balance, speaking, walking -restlessness, excitability, or feelings of agitation -unusual activities while asleep like driving, eating, making phone calls Side effects that usually do not require medical attention (report to your doctor or health care professional if they continue or are bothersome): -diarrhea -dizziness, or daytime drowsiness, sometimes called a hangover effect -headache This list may not describe all possible side effects. Call your doctor for medical advice about side effects. You may report side effects to FDA at 1-800-FDA-1088. Where should I keep my medicine? Keep out of the reach of children. This medicine can be abused. Keep your medicine in a safe place to protect it from theft. Do not share this medicine with anyone. Selling or giving away this medicine is dangerous and against the law. Store at controlled room temperature between 15 and 25 degrees C (59 and 77 degrees F). Throw away any unused medicine after the expiration  date. NOTE: This sheet is a summary. It may not cover all possible information. If you have questions about this medicine, talk to your doctor, pharmacist, or health care provider.  2015, [employer]vier/[employer] Standard. (2012-03-17 12:44:09)

## 2015-06-17 NOTE — Progress Notes (Signed)
S[facility]EP [facility]   Provider:  Larey Seat, M D  Referring Provider: Burnis Medin, MD Primary Care Physician:  Lottie Dawson, MD  Chief Complaint  Patient presents with  . Insomnia    ambien refill visit, working well, rm 11, alone    HPI:  Tiffany Carson is a 49 y.o. female , seen here as a revisit  from Dr. Regis Bill for  Insomnia, but presenting with a task specific dystonia.  Chief complaint according to patient : here today for Ambien refill. The patient reports that she is doing well on Ambien 12.5 in an extended release form, provided by Dr. Regis Bill.  The patient had undergone a sleep study which revealed no organic sleep disorder of significance. She is meanwhile 50 years old, she underwent a hysterectomy at age 25 and very likely went into menopause within the next 2 years. This was a time of exacerbation of insomnia.  She still needs about an hour to fall asleep after taking Ambien. Her bedtime is usually around 11:30 PM so she may fall asleep anytime between midnight and 1 AM. She does not have nocturia breaks, she usually sleeps through until 4 AM when her alarm rings. So she really doesn't gets a lot of sleep only about 4 hours with the help of medication each night. Her fatigue severity score was endorsed at 23 points the Epworth sleepiness score at 4 points both are below average not indicative of high fatigue or high sleepiness. She arrives at work at 6 AM. She takes rarely afternoon naps, if so- 30 minutes duration.  She was given Vyvanse for adult ADD, and has tolerated this well, no tics, no twitches. 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  She is aware that the continuous use of Ambien can lead to addiction. She had sleep eating spells on Ambien.   Social history:   Review of Systems: Out of a complete 14 system review, the patient complains of only the following symptoms, and all other  reviewed systems are negative.   Epworth score  4 , Fatigue severity score 25  , depression score 0   Social History   Social History  . Marital Status: Single    Spouse Name: n/a  . Number of Children: 0  . Years of Education: 16   Occupational History  . NUCLEAR MEDICINE     [employer]   Social History Main Topics  . Smoking status: Never Smoker   . Smokeless tobacco: Never Used  . Alcohol Use: 0.0 - 0.6 oz/week    0-1 Standard drinks or equivalent per week     Comment: rarely  . Drug Use: No  . Sexual Activity: No   Other Topics Concern  . Not on file   Social History Narrative   Lives alone, with 1 dog.   Cousins live nearby.  Siblings are in middle [place].   Neg td  ocass etoh 1 caffeine per day    40 hours  Per week.    Patient is right-handed.   Patient has a Haematologist.             Family History  Problem Relation Age of Onset  . Arthritis    . Hypertension    . Diabetes Mother   . Hypertension Mother   . Diabetes Brother 52    2013, type 2    Past Medical History  Diagnosis Date  .  FIBROIDS, UTERUS 09/03/2010  . VITAMIN D DEFICIENCY 06/02/2010  . HYPERLIPIDEMIA 08/08/2007  . OBESITY 06/24/2009  . ANEMIA, IRON DEFICIENCY 07/02/2010  . GERD 08/08/2007  . Irregular menstrual cycle 06/24/2009  . LEG PAIN, RIGHT 10/03/2010  . LEG CRAMPS 06/24/2009  . SLEEPLESSNESS 11/16/2007  . FATIGUE 06/24/2009  . TREMOR 07/02/2010  . TINGLING 10/03/2010  . COUGH, CHRONIC 04/09/2010  . ABDOMINAL PAIN RIGHT LOWER QUADRANT 10/03/2010  . Acquired absence of both cervix and uterus 10/03/2010    Past Surgical History  Procedure Laterality Date  . Cholecystectomy    . Oophorectomy      left   salpingesctomy   . Abdominal hysterectomy  nov 2011    secondary infection  had adhesions  . Breast reduction surgery  09 20 12  . Breast surgery      Current Outpatient Prescriptions  Medication Sig Dispense Refill  . botulinum toxin Type A (BOTOX)  100 UNITS SOLR injection 100 units will be administered in the office by provider to right muscle for dystonia every 3 months. 1 vial 1  . ibuprofen (ADVIL,MOTRIN) 200 MG tablet Take 200 mg by mouth every 6 (six) hours as needed.      Marland Kitchen lisdexamfetamine (VYVANSE) 30 MG capsule Take 30 mg by mouth daily.    . meloxicam (MOBIC) 15 MG tablet TAKE 1 TABLET BY MOUTH ONCE DAILY 30 tablet 2  . omeprazole (PRILOSEC) 20 MG capsule Take 20 mg by mouth as needed.    . Vitamin D, Cholecalciferol, 400 UNITS CHEW Chew by mouth.     . zolpidem (AMBIEN CR) 12.5 MG CR tablet Take 1 tablet (12.5 mg total) by mouth at bedtime as needed. for sleep 30 tablet 0   No current facility-administered medications for this visit.    Allergies as of 06/17/2015  . (No Active Allergies)    Vitals: BP 132/70 mmHg  Pulse 86  Resp 20  Ht 5\' 2"  (1.575 m)  Wt 180 lb (81.647 kg)  BMI 32.91 kg/m2 Last Weight:  Wt Readings from Last 1 Encounters:  06/17/15 180 lb (81.647 kg)   HQI:ONGE mass index is 32.91 kg/(m^2).     Last Height:   Ht Readings from Last 1 Encounters:  06/17/15 5\' 2"  (1.575 m)    Physical exam:  General: The patient is awake, alert and appears not in acute distress. The patient is well groomed. Head: Normocephalic, atraumatic. Neck is supple. Mallampati 3,  neck circumference: 15. Nasal airflow seasonally restricted , TMJ is not  evident . Retrognathia is not seen.  Cardiovascular:  Regular rate and rhythm , without  murmurs or carotid bruit, and without distended neck veins. Respiratory: Lungs are clear to auscultation. Skin:  Without evidence of edema, or rash Trunk: BMI is elevated . The patient's posture is erect.  Neurologic exam : The patient is awake and alert, oriented to place and time.   Memory subjective described as intact.   Attention span & concentration ability appears normal.  Speech is fluent,  without  dysarthria, dysphonia or aphasia.  Mood and affect are  appropriate.  Cranial nerves: Pupils are equal and briskly reactive to light.  Extraocular movements  in vertical and horizontal planes intact and without nystagmus.  Visual fields by finger perimetry are intact. Hearing to finger rub intact.   Facial sensation intact to fine touch.  Facial motor strength is symmetric and tongue and uvula move midline. Shoulder shrug was symmetrical.   Motor exam:  Normal tone, muscle bulk  and symmetric strength in all extremities.  Sensory:  Fine touch, pinprick and vibration were tested in all extremities.  Proprioception tested in the upper extremities was normal.  Coordination: Rapid alternating movements in the fingers/hands was normal. Finger-to-nose maneuver  normal without evidence of ataxia, dysmetria or tremor.  She has a task specific dystonia/ writers cramp .   Gait and station: Patient walks without assistive device and is able unassisted to climb up to the exam table. Strength within normal limits.  Stance is stable and normal.  Deep tendon reflexes: in the upper and lower extremities are symmetric and intact. Babinski maneuver response is downgoing.  The patient was advised of the nature of the diagnosed  disorder , the treatment options and risks for general a health and wellness arising from not treating the condition.  I spent more than 15  minutes of face to face time with the patient. Greater than 50% of time was spent in counseling and coordination of care. We have discussed the diagnosis and differential and I answered the patient's questions.     Assessment:  After physical and neurologic examination, review of laboratory studies,  Personal review of imaging studies, reports of other /same  Imaging studies ,  Results of polysomnography/ neurophysiology testing and pre-existing records as far as provided in visit., my assessment is   1) chronic insomnia- uses Ambien 12.5 mg prn.  2) Botox treatments with Dr Krista Blue for dystonia. Botox  was denied, can another similar drug be used?   3) obesity - discussed low carb diet.     Rv once a year for Ambien.    Asencion Partridge Shooter Tangen MD  06/17/2015   CC: Burnis Medin, Md [address] [place], [place] [address]

## 2015-06-18 NOTE — Telephone Encounter (Signed)
Called patient to inform botox injection in appeal status. Patient verbalized understanding.

## 2015-06-19 NOTE — Telephone Encounter (Signed)
Called pt to inform Botox prior auth approved. Phone [facility] to schedule next available Botox appt w/ Dr. Krista Blue. Pt prefers a Monday appt. Thanks

## 2015-06-24 ENCOUNTER — Ambulatory Visit: Payer: 59

## 2015-06-26 ENCOUNTER — Encounter: Payer: Self-pay | Admitting: Neurology

## 2015-06-26 ENCOUNTER — Ambulatory Visit (INDEPENDENT_AMBULATORY_CARE_PROVIDER_SITE_OTHER): Payer: 59 | Admitting: Neurology

## 2015-06-26 VITALS — BP 125/74 | HR 93 | Ht 62.0 in | Wt 184.0 lb

## 2015-06-26 DIAGNOSIS — F488 Other specified nonpsychotic mental disorders: Secondary | ICD-10-CM

## 2015-06-26 DIAGNOSIS — G2589 Other specified extrapyramidal and movement disorders: Secondary | ICD-10-CM | POA: Diagnosis not present

## 2015-06-26 NOTE — Progress Notes (Signed)
PATIENT: Tiffany Carson DOB: [birth year]  HISTORICAL  Tiffany Carson is a 49 years old right-handed female, returned for EMG guided Botox injection for her writer's cramp, she was referred by Dr. Brett Fairy.  She had a past medical history of chronic insomnia, is under the care of Dr. Brett Fairy, taking Ambien CR 12.5 mg every night, also recently diagnosed with ADHD, taking Vyvance.  She noticed mild bilateral hands tremor since age 5s, getting worse throughout the years, especially her right hand, there was no significant limitations in her activity has a Health visitor, she was able to started IV, if she find a certain position, no trouble using utensils, the most bothersome symptoms is writing with her right hand, she felt forceful pulling of her right wrist to extensor position, she felt stretching achy muscle pain at the right dorsal forearm, her handwriting has changed significantly over the years, she is planning on going to graduate school, difficulty writing has put major limitations  and social embarrassment, she has no trouble writing with her left hand.  She has tried propranolol for one year 60 mg daily, without helping her symptoms, recently she was started on Sinemet 25/100, after 2 tablets 3 times a day without helping her symptoms as well, sometimes has GI side effect of nausea.  She never had a good sense of smell, she denies REM sleep disorder, not memory loss, no gait difficulty, no orthostatic dizziness, chronic constipation  Update September 19 2014  She came in for her first electrical stimulation guided 50 units Botox injection for her right hand dystonia, potential side effect explained, she voiced understanding  UPDATE Dec 8th 2015:  follow-up her most recent Botox injection in September 19 2014, for her right writer's cramp, she reported moderate improvement, she can hold her pen better, but continue to struggle with writing, no difficulty with typing,  starting IV, and the other tasks require using right hand,  She did not notice any side effect  UPDATE Feb 24th 2016:  She had her first EMG guided Botox injection in Nov 18th 2015,   Update June 26 2015: Last Botox injection was in February 2016, she responded very well, benefit last about 6 months, at the peak of the benefit, she has a mild right hand weakness, drop the mug out of her right hand,  ROS: Full 14 system review of systems performed and notable only for As above   ALLERGIES: No Active Allergies  HOME MEDICATIONS: Current Outpatient Prescriptions on File Prior to Visit  Medication Sig Dispense Refill  . botulinum toxin Type A (BOTOX) 100 UNITS SOLR injection 100 units will be administered in the office by provider to right muscle for dystonia every 3 months. 1 vial 1  . ibuprofen (ADVIL,MOTRIN) 200 MG tablet Take 200 mg by mouth every 6 (six) hours as needed.      Marland Kitchen lisdexamfetamine (VYVANSE) 30 MG capsule Take 30 mg by mouth daily.    Marland Kitchen omeprazole (PRILOSEC) 20 MG capsule Take 20 mg by mouth as needed.    . Vitamin D, Cholecalciferol, 400 UNITS CHEW Chew by mouth.     . zolpidem (AMBIEN CR) 12.5 MG CR tablet Take 1 tablet (12.5 mg total) by mouth at bedtime as needed. for sleep 90 tablet 1   No current facility-administered medications on file prior to visit.    PAST MEDICAL HISTORY: Past Medical History  Diagnosis Date  . FIBROIDS, UTERUS 09/03/2010  . VITAMIN D DEFICIENCY 06/02/2010  . HYPERLIPIDEMIA  08/08/2007  . OBESITY 06/24/2009  . ANEMIA, IRON DEFICIENCY 07/02/2010  . GERD 08/08/2007  . Irregular menstrual cycle 06/24/2009  . LEG PAIN, RIGHT 10/03/2010  . LEG CRAMPS 06/24/2009  . SLEEPLESSNESS 11/16/2007  . FATIGUE 06/24/2009  . TREMOR 07/02/2010  . TINGLING 10/03/2010  . COUGH, CHRONIC 04/09/2010  . ABDOMINAL PAIN RIGHT LOWER QUADRANT 10/03/2010  . Acquired absence of both cervix and uterus 10/03/2010    PAST SURGICAL HISTORY: Past Surgical History  Procedure  Laterality Date  . Cholecystectomy    . Oophorectomy      left   salpingesctomy   . Abdominal hysterectomy  nov 2011    secondary infection  had adhesions  . Breast reduction surgery  09 20 12  . Breast surgery      FAMILY HISTORY: Family History  Problem Relation Age of Onset  . Arthritis    . Hypertension    . Diabetes Mother   . Hypertension Mother   . Diabetes Brother 67    2013, type 2    SOCIAL HISTORY:  Social History   Social History  . Marital Status: Single    Spouse Name: n/a  . Number of Children: 0  . Years of Education: 16   Occupational History  . NUCLEAR MEDICINE     [employer]   Social History Main Topics  . Smoking status: Never Smoker   . Smokeless tobacco: Never Used  . Alcohol Use: 0.0 - 0.6 oz/week    0-1 Standard drinks or equivalent per week     Comment: rarely  . Drug Use: No  . Sexual Activity: No   Other Topics Concern  . Not on file   Social History Narrative   Lives alone, with 1 dog.   Cousins live nearby.  Siblings are in middle [place].   Neg td  ocass etoh 1 caffeine per day    40 hours  Per week.    Patient is right-handed.   Patient has a Haematologist.              PHYSICAL EXAM   Filed Vitals:   06/26/15 1719  BP: 125/74  Pulse: 93  Height: 5\' 2"  (1.575 m)  Weight: 184 lb (83.462 kg)    Not recorded      Body mass index is 33.65 kg/(m^2).   Generalized: In no acute distress  Neck: Supple, no carotid bruits   Cardiac: Regular rate rhythm  Pulmonary: Clear to auscultation bilaterally  Musculoskeletal: No deformity  Neurological examination  Mentation: Alert oriented to time, place, history taking, and causual conversation  Cranial nerve II-XII: Pupils were equal round reactive to light. Extraocular movements were full.  Visual field were full on confrontational test. Bilateral fundi were sharp.  Facial sensation and strength were normal. Hearing was intact to finger  rubbing bilaterally. Uvula tongue midline.  Head turning and shoulder shrug and were normal and symmetric.Tongue protrusion into cheek strength was normal.  Motor: Normal tone, bulk and strength. She has no significant rigidity, large amplitude right hand tremor while she writing, improved  tendency to have excessive right wrist extension, grip pen very tightly with her right index, and thumb, with right forearm pronation/supranation  Sensory: Intact to fine touch, pinprick, preserved vibratory sensation, and proprioception at toes.  Coordination: Normal finger to nose, heel-to-shin bilaterally there was no truncal ataxia  Gait: Rising up from seated position without assistance, normal stance, without trunk ataxia, moderate stride, good arm swing, smooth turning,  able to perform tiptoe, and heel walking without difficulty.   Romberg signs: Negative  Deep tendon reflexes: Brachioradialis 2/2, biceps 2/2, triceps 2/2, patellar 2/2, Achilles 2/2, plantar responses were flexor bilaterally.   DIAGNOSTIC DATA (LABS, IMAGING, TESTING) - I reviewed patient records, labs, notes, testing and imaging myself where available.  Lab Results  Component Value Date   WBC 6.8 12/26/2012   HGB 15.6* 12/26/2012   HCT 46.0 12/26/2012   MCV 90.7 12/26/2012   PLT 425.0* 12/26/2012      Component Value Date/Time   NA 139 12/26/2012 0952   K 4.1 12/26/2012 0952   CL 105 12/26/2012 0952   CO2 25 12/26/2012 0952   GLUCOSE 98 12/26/2012 0952   BUN 8 12/26/2012 0952   CREATININE 0.7 12/26/2012 0952   CALCIUM 9.2 12/26/2012 0952   PROT 7.1 12/26/2012 0952   ALBUMIN 4.2 12/26/2012 0952   AST 20 12/26/2012 0952   ALT 20 12/26/2012 0952   ALKPHOS 58 12/26/2012 0952   BILITOT 0.7 12/26/2012 0952   GFRNONAA 100.56 10/03/2010 0000   Lab Results  Component Value Date   CHOL 181 12/26/2012   HDL 55.20 12/26/2012   LDLCALC 113* 12/26/2012   LDLDIRECT 135.4 06/27/2012   TRIG 62.0 12/26/2012   CHOLHDL 3  12/26/2012   Lab Results  Component Value Date   HGBA1C 5.7 06/27/2012   Lab Results  Component Value Date   VITAMINB12 488 10/17/2010   Lab Results  Component Value Date   TSH 1.28 12/26/2012    ASSESSMENT AND PLAN  EUFELIA VENO is a 49 y.o. female  with right hand dystonia while writing, consistent with writer's cramp, tried and failed propanolol 60 mg daily, Sinemet 25/100 mg 2 tablets 3 times a day  electrical stimulation guided Botox injection  for her right hand writer's cramp, first injection was in September 19 2014  Electrical stimulation guided botulism toxin injection for writer's cramp of right side, used BOTOX A 100 units  Right pronator teres 12.5 units Right extensor carpi radialis longus 12.5 Flexor digitorum superficialis 12.5 Right brachial radialis 12.5. Right palmaris longus 12.5 Right flexor carpi ulnaris 12.5 Right flexor carpi radialis 12.5 Right extensor indicis 12.5    Continue injection in 3 months 100 units,    Marcial Pacas, M.D. Ph.D.  [facility] [address] [place], [place] [address] [phone]

## 2015-06-26 NOTE — Progress Notes (Signed)
**  Botox, Lot [id number], Exp 12/2017, NDC 0023-1145-01**mck,rn.

## 2015-07-15 ENCOUNTER — Ambulatory Visit: Payer: 59 | Admitting: Neurology

## 2015-09-04 NOTE — Telephone Encounter (Signed)
Called patient and left her a VM asking her to return my call if she would like to schedule BOTOX INJ.

## 2015-10-01 ENCOUNTER — Ambulatory Visit (INDEPENDENT_AMBULATORY_CARE_PROVIDER_SITE_OTHER): Payer: 59 | Admitting: Family Medicine

## 2015-10-01 VITALS — BP 120/70 | HR 100 | Temp 98.1°F | Resp 18 | Ht 64.0 in | Wt 185.0 lb

## 2015-10-01 DIAGNOSIS — J209 Acute bronchitis, unspecified: Secondary | ICD-10-CM | POA: Diagnosis not present

## 2015-10-01 DIAGNOSIS — J019 Acute sinusitis, unspecified: Secondary | ICD-10-CM | POA: Diagnosis not present

## 2015-10-01 MED ORDER — HYDROCODONE-HOMATROPINE 5-1.5 MG/5ML PO SYRP: 5.0000 mL | ORAL_SOLUTION | ORAL | Status: DC | PRN

## 2015-10-01 MED ORDER — BENZONATATE 100 MG PO CAPS: 100.0000 mg | ORAL_CAPSULE | Freq: Three times a day (TID) | ORAL | Status: DC | PRN

## 2015-10-01 MED ORDER — AMOXICILLIN-POT CLAVULANATE 875-125 MG PO TABS: 1.0000 | ORAL_TABLET | Freq: Two times a day (BID) | ORAL | Status: DC

## 2015-10-01 NOTE — Progress Notes (Signed)
Patient ID: Tiffany Carson, female    DOB: 11/11/1965  Age: 49 y.o. MRN: [id number]  Chief Complaint  Patient presents with  . Sore Throat    fever, chills started last monday   . Cough  . 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 She has been out of town caring for her mother in [place] over the last week. She got ill with a sore throat and now has headache congestion and cough. Blowing and coughing up purulent looking mucus. She does not smoke. She generally is a healthy person. She has had some spasming in her back, which she says she is gotten in the past when she had a bad upper respiratory illnesses.  Current allergies, medications, problem list, past/family and social histories reviewed.  Objective:  BP 120/70 mmHg  Pulse 100  Temp(Src) 98.1 F (36.7 C) (Oral)  Resp 18  Ht 5\' 4"  (1.626 m)  Wt 185 lb (83.915 kg)  BMI 31.74 kg/m2  SpO2 96%  No major distress. Her TMs are normal. Nose congested. Throat clear except for little erythema streaks where she is draining some. Neck supple without significant nodes. Chest is clear to auscultation. She does have a little coarser breath sounds on the left base posteriorly but does not sound like defined rhonchi or rales. Heart regular without murmur(  Assessment & Plan:   Assessment: 1. Acute sinusitis, recurrence not specified, unspecified location   2. Acute bronchitis, unspecified organism       Plan: Treatment symptomatically and with Augmentin. Return if worse.  No orders of the defined types were placed in this encounter.    Meds ordered this encounter  Medications  . amoxicillin-clavulanate (AUGMENTIN) 875-125 MG tablet    Sig: Take 1 tablet by mouth 2 (two) times daily.    Dispense:  20 tablet    Refill:  0  . HYDROcodone-homatropine (HYCODAN) 5-1.5 MG/5ML syrup    Sig: Take 5 mLs by mouth every 4 (four) hours as needed.    Dispense:  120 mL    Refill:  0  . benzonatate  (TESSALON) 100 MG capsule    Sig: Take 1-2 capsules (100-200 mg total) by mouth 3 (three) times daily as needed.    Dispense:  30 capsule    Refill:  0         Patient Instructions  Augmentin 1 twice daily for infection  Take the Hycodan cough syrup 1 teaspoon every 4-6 hours as needed for cough  When you return to work you can take the benzonatate cough pills one or 2 pills 3 times daily as needed for daytime cough  Drink plenty of fluids and get enough rest  If the head is too congested go ahead and take some pseudoephedrine or Claritin-D or Allegra-D or Zyrtec-D for congestion  Return if worse     Return if symptoms worsen or fail to improve.   HOPPER,DAVID, MD 10/01/2015

## 2015-10-01 NOTE — Patient Instructions (Signed)
Augmentin 1 twice daily for infection  Take the Hycodan cough syrup 1 teaspoon every 4-6 hours as needed for cough  When you return to work you can take the benzonatate cough pills one or 2 pills 3 times daily as needed for daytime cough  Drink plenty of fluids and get enough rest  If the head is too congested go ahead and take some pseudoephedrine or Claritin-D or Allegra-D or Zyrtec-D for congestion  Return if worse

## 2015-10-14 ENCOUNTER — Ambulatory Visit (INDEPENDENT_AMBULATORY_CARE_PROVIDER_SITE_OTHER): Payer: 59 | Admitting: Physician Assistant

## 2015-10-14 VITALS — BP 124/82 | HR 98 | Temp 98.0°F | Resp 18 | Ht 64.0 in | Wt 182.4 lb

## 2015-10-14 DIAGNOSIS — J209 Acute bronchitis, unspecified: Secondary | ICD-10-CM

## 2015-10-14 DIAGNOSIS — J019 Acute sinusitis, unspecified: Secondary | ICD-10-CM

## 2015-10-14 MED ORDER — ALBUTEROL SULFATE HFA 108 (90 BASE) MCG/ACT IN AERS: 2.0000 | INHALATION_SPRAY | RESPIRATORY_TRACT | Status: DC | PRN

## 2015-10-14 MED ORDER — GUAIFENESIN ER 1200 MG PO TB12: 1.0000 | ORAL_TABLET | Freq: Two times a day (BID) | ORAL | Status: DC | PRN

## 2015-10-14 MED ORDER — HYDROCOD POLST-CPM POLST ER 10-8 MG/5ML PO SUER: 5.0000 mL | Freq: Two times a day (BID) | ORAL | Status: DC | PRN

## 2015-10-14 MED ORDER — LEVOFLOXACIN 500 MG PO TABS: 500.0000 mg | ORAL_TABLET | Freq: Every day | ORAL | Status: AC

## 2015-10-14 NOTE — Patient Instructions (Signed)
Get plenty of rest and drink at least 64 ounces of water daily.  My mobile number is [phone] Please send me an e-mail [email or web address] or send me a staff message, and I'll send you the instructions/expectations for students.  Evening and weekend shifts in December 2016: Wednesday 10/16/2015 6p-8:30p Wednesday 10/30/15 6p-8:30p

## 2015-10-14 NOTE — Progress Notes (Signed)
Patient ID: Tiffany Carson, female    DOB: May 24, 1966, 49 y.o.   MRN: [id number]  PCP: Lottie Dawson, MD  Subjective:   Chief Complaint  Patient presents with  . Follow-up    congestion,cough    HPI Presents for evaluation of persistent cough and congestion.  Seen 10/01/2015. Diagnosed with sinusitis and bronchitis. Prescribed Augmentin, Tessalon Perles and Hycodan. Doesn't seem to be able to "kick this thing." When she's had this in the past, it's taken a couple of visits and something stronger. Chart reviewed. Last episode for which she was seen here was April-May 2015. "Feels like it's all right here" (pats her chest). "Still nasally." Cough at night persists. Productive of yellow sputum. No fevers since her initial evaluation. Ears still feel full and congested. Some sinus pressure, but doesn't feel really congested "head-wise."  Review of Systems As above.    Patient Active Problem List   Diagnosis Date Noted  . Dystonia, torsion, fragments of 06/17/2015  . Insomnia due to mental condition 06/17/2015  . Obesity 06/17/2015  . Akinetic rigid syndrome 12/26/2014  . Writer's cramp 12/26/2014  . Snoring 04/09/2014  . Swelling of joint of right knee 03/30/2014  . Insomnia, persistent 02/19/2014  . Primary snoring 02/19/2014  . Focal dystonia 02/19/2014  . Task-specific dystonia of hand 02/19/2014  . Hand pain, right 12/04/2013  . Visit for preventive health examination 01/09/2013  . Medial knee pain 11/30/2012  . Gynecomastia, female 02/20/2011  . Neck pain 02/20/2011  . ACQUIRED ABSENCE OF BOTH CERVIX AND UTERUS 10/03/2010  . TREMOR 07/02/2010  . VITAMIN D DEFICIENCY 06/02/2010  . OBESITY 06/24/2009  . SLEEPLESSNESS 11/16/2007  . GERD 08/08/2007     Prior to Admission medications   Medication Sig Start Date End Date Taking? Authorizing Provider  benzonatate (TESSALON) 100 MG capsule Take 1-2 capsules (100-200 mg total) by mouth 3 (three) times  daily as needed. 10/01/15  Yes Posey Boyer, MD  HYDROcodone-homatropine [facility]) 5-1.5 MG/5ML syrup Take 5 mLs by mouth every 4 (four) hours as needed. 10/01/15  Yes Posey Boyer, MD  ibuprofen (ADVIL,MOTRIN) 200 MG tablet Take 200 mg by mouth every 6 (six) hours as needed.     Yes Historical Provider, MD  lisdexamfetamine (VYVANSE) 30 MG capsule Take 30 mg by mouth daily.   Yes Historical Provider, MD  omeprazole (PRILOSEC) 20 MG capsule Take 20 mg by mouth as needed. 10/05/11  Yes Burnis Medin, MD  Vitamin D, Cholecalciferol, 400 UNITS CHEW Chew by mouth.    Yes Burnis Medin, MD  VYVANSE 20 MG capsule Take 20 mg by mouth daily at 3 pm. 10/11/15  Yes Historical Provider, MD  VYVANSE 40 MG capsule Take 40 mg by mouth every morning. 10/11/15  Yes Historical Provider, MD  zolpidem (AMBIEN CR) 12.5 MG CR tablet Take 1 tablet (12.5 mg total) by mouth at bedtime as needed. for sleep 06/17/15  Yes Larey Seat, MD  botulinum toxin Type A (BOTOX) 100 UNITS SOLR injection 100 units will be administered in the office by provider to right muscle for dystonia every 3 months. Patient not taking: Reported on 10/01/2015 05/28/15   Marcial Pacas, MD     No Known Allergies     Objective:  Physical Exam  Constitutional: She is oriented to person, place, and time. Vital signs are normal. She appears well-developed and well-nourished. She is active and cooperative. No distress.  BP 124/82 mmHg  Pulse 98  Temp(Src) 98 F (36.7  C) (Oral)  Resp 18  Ht 5\' 4"  (1.626 m)  Wt 182 lb 6.4 oz (82.736 kg)  BMI 31.29 kg/m2  SpO2 99%   HENT:  Head: Normocephalic and atraumatic.  Right Ear: Hearing, tympanic membrane, external ear and ear canal normal.  Left Ear: Hearing, tympanic membrane, external ear and ear canal normal.  Nose: Mucosal edema and rhinorrhea present.  No foreign bodies. Right sinus exhibits maxillary sinus tenderness. Right sinus exhibits no frontal sinus tenderness. Left sinus exhibits  maxillary sinus tenderness. Left sinus exhibits no frontal sinus tenderness.  Mouth/Throat: Uvula is midline, oropharynx is clear and moist and mucous membranes are normal. No oral lesions. No uvula swelling. No oropharyngeal exudate.  Eyes: Conjunctivae and EOM are normal. Pupils are equal, round, and reactive to light. Right eye exhibits no discharge. Left eye exhibits no discharge. No scleral icterus.  Neck: Trachea normal, normal range of motion and full passive range of motion without pain. Neck supple. No thyroid mass and no thyromegaly present.  Cardiovascular: Normal rate, regular rhythm and normal heart sounds.   Pulmonary/Chest: Effort normal and breath sounds normal.  Lymphadenopathy:       Head (right side): No submandibular, no tonsillar, no preauricular, no posterior auricular and no occipital adenopathy present.       Head (left side): No submandibular, no tonsillar, no preauricular and no occipital adenopathy present.    She has no cervical adenopathy.       Right: No supraclavicular adenopathy present.       Left: No supraclavicular adenopathy present.  Neurological: She is alert and oriented to person, place, and time. She has normal strength. No cranial nerve deficit or sensory deficit.  Skin: Skin is warm, dry and intact. No rash noted.  Psychiatric: She has a normal mood and affect. Her speech is normal and behavior is normal.           Assessment & Plan:   1. Acute sinusitis, recurrence not specified, unspecified location 2. Acute bronchitis, unspecified organism Persistent after treatment. Re-treat with the regimen that resolved her symptoms the last time this happened. - levofloxacin (LEVAQUIN) 500 MG tablet; Take 1 tablet (500 mg total) by mouth daily.  Dispense: 7 tablet; Refill: 0 - chlorpheniramine-HYDROcodone (TUSSIONEX PENNKINETIC ER) 10-8 MG/5ML SUER; Take 5 mLs by mouth every 12 (twelve) hours as needed for cough.  Dispense: 100 mL; Refill: 0 - Guaifenesin  (MUCINEX MAXIMUM STRENGTH) 1200 MG TB12; Take 1 tablet (1,200 mg total) by mouth every 12 (twelve) hours as needed.  Dispense: 14 tablet; Refill: 1 - albuterol (PROVENTIL HFA;VENTOLIN HFA) 108 (90 BASE) MCG/ACT inhaler; Inhale 2 puffs into the lungs every 4 (four) hours as needed for wheezing or shortness of breath (cough, shortness of breath or wheezing.).  Dispense: 1 Inhaler; Refill: 1   Fara Chute, PA-C Physician Assistant-Certified [facility]

## 2015-11-25 DIAGNOSIS — H04123 Dry eye syndrome of bilateral lacrimal glands: Secondary | ICD-10-CM | POA: Diagnosis not present

## 2015-11-25 DIAGNOSIS — H43393 Other vitreous opacities, bilateral: Secondary | ICD-10-CM | POA: Diagnosis not present

## 2015-11-25 DIAGNOSIS — D3131 Benign neoplasm of right choroid: Secondary | ICD-10-CM | POA: Diagnosis not present

## 2015-12-02 DIAGNOSIS — F1920 Other psychoactive substance dependence, uncomplicated: Secondary | ICD-10-CM | POA: Diagnosis not present

## 2015-12-02 DIAGNOSIS — Z79899 Other long term (current) drug therapy: Secondary | ICD-10-CM | POA: Diagnosis not present

## 2015-12-02 DIAGNOSIS — F4010 Social phobia, unspecified: Secondary | ICD-10-CM | POA: Diagnosis not present

## 2015-12-02 DIAGNOSIS — F902 Attention-deficit hyperactivity disorder, combined type: Secondary | ICD-10-CM | POA: Diagnosis not present

## 2015-12-13 ENCOUNTER — Other Ambulatory Visit: Payer: Self-pay | Admitting: Neurology

## 2015-12-16 NOTE — Telephone Encounter (Signed)
Pt need refill on zolpidem (AMBIEN CR) 12.5 MG CR tablet. Thank you

## 2016-02-10 ENCOUNTER — Other Ambulatory Visit: Payer: 59

## 2016-02-11 ENCOUNTER — Other Ambulatory Visit (INDEPENDENT_AMBULATORY_CARE_PROVIDER_SITE_OTHER): Payer: 59

## 2016-02-11 DIAGNOSIS — Z0000 Encounter for general adult medical examination without abnormal findings: Secondary | ICD-10-CM

## 2016-02-11 LAB — BASIC METABOLIC PANEL
BUN: 15 mg/dL (ref 6–23)
CALCIUM: 9.6 mg/dL (ref 8.4–10.5)
CO2: 27 mEq/L (ref 19–32)
Chloride: 104 mEq/L (ref 96–112)
Creatinine, Ser: 0.65 mg/dL (ref 0.40–1.20)
GFR: 101.85 mL/min (ref >60.00)
Glucose, Bld: 84 mg/dL (ref 70–99)
Potassium: 4.3 mEq/L (ref 3.5–5.1)
SODIUM: 140 meq/L (ref 135–145)

## 2016-02-11 LAB — HEPATIC FUNCTION PANEL
ALT: 22 U/L (ref 0–35)
AST: 18 U/L (ref 0–37)
Albumin: 4.5 g/dL (ref 3.5–5.2)
Alkaline Phosphatase: 78 U/L (ref 39–117)
BILIRUBIN DIRECT: 0 mg/dL (ref 0.0–0.3)
BILIRUBIN TOTAL: 0.4 mg/dL (ref 0.2–1.2)
Total Protein: 7 g/dL (ref 6.0–8.3)

## 2016-02-11 LAB — CBC WITH DIFFERENTIAL/PLATELET
BASOS ABS: 0 10*3/uL (ref 0.0–0.1)
Basophils Relative: 0.5 % (ref 0.0–3.0)
Eosinophils Absolute: 0.1 10*3/uL (ref 0.0–0.7)
Eosinophils Relative: 1.3 % (ref 0.0–5.0)
HEMATOCRIT: 41.9 % (ref 36.0–46.0)
Hemoglobin: 14.2 g/dL (ref 12.0–15.0)
LYMPHS ABS: 2.8 10*3/uL (ref 0.7–4.0)
LYMPHS PCT: 31.8 % (ref 12.0–46.0)
MCHC: 33.9 g/dL (ref 30.0–36.0)
MCV: 87.8 fl (ref 78.0–100.0)
MONOS PCT: 7.8 % (ref 3.0–12.0)
Monocytes Absolute: 0.7 10*3/uL (ref 0.1–1.0)
NEUTROS PCT: 58.6 % (ref 43.0–77.0)
Neutro Abs: 5.2 10*3/uL (ref 1.4–7.7)
Platelets: 403 10*3/uL — ABNORMAL HIGH (ref 150.0–400.0)
RBC: 4.77 Mil/uL (ref 3.87–5.11)
RDW: 13 % (ref 11.5–15.5)
WBC: 8.8 10*3/uL (ref 4.0–10.5)

## 2016-02-11 LAB — LIPID PANEL
CHOLESTEROL: 232 mg/dL — AB (ref 0–200)
HDL: 59 mg/dL (ref >39.00)
LDL CALC: 154 mg/dL — AB (ref 0–99)
NonHDL: 173.08
TRIGLYCERIDES: 95 mg/dL (ref 0.0–149.0)
Total CHOL/HDL Ratio: 4
VLDL: 19 mg/dL (ref 0.0–40.0)

## 2016-02-11 LAB — TSH: TSH: 1.47 u[IU]/mL (ref 0.35–4.50)

## 2016-02-19 ENCOUNTER — Encounter: Payer: Self-pay | Admitting: Internal Medicine

## 2016-02-19 ENCOUNTER — Ambulatory Visit (INDEPENDENT_AMBULATORY_CARE_PROVIDER_SITE_OTHER): Payer: 59 | Admitting: Internal Medicine

## 2016-02-19 VITALS — BP 120/80 | Temp 98.3°F | Ht 62.0 in | Wt 178.4 lb

## 2016-02-19 DIAGNOSIS — E785 Hyperlipidemia, unspecified: Secondary | ICD-10-CM | POA: Diagnosis not present

## 2016-02-19 DIAGNOSIS — I8393 Asymptomatic varicose veins of bilateral lower extremities: Secondary | ICD-10-CM

## 2016-02-19 DIAGNOSIS — I809 Phlebitis and thrombophlebitis of unspecified site: Secondary | ICD-10-CM | POA: Diagnosis not present

## 2016-02-19 DIAGNOSIS — Z0000 Encounter for general adult medical examination without abnormal findings: Secondary | ICD-10-CM

## 2016-02-19 DIAGNOSIS — Z0001 Encounter for general adult medical examination with abnormal findings: Secondary | ICD-10-CM | POA: Diagnosis not present

## 2016-02-19 DIAGNOSIS — Z1211 Encounter for screening for malignant neoplasm of colon: Secondary | ICD-10-CM | POA: Diagnosis not present

## 2016-02-19 DIAGNOSIS — R7301 Impaired fasting glucose: Secondary | ICD-10-CM

## 2016-02-19 DIAGNOSIS — I8390 Asymptomatic varicose veins of unspecified lower extremity: Secondary | ICD-10-CM

## 2016-02-19 NOTE — Patient Instructions (Addendum)
Continue lifestyle intervention healthy eating and exercise . Will get a doppler  Right leg   Probably.  Superficial   Phlebitis    Warm compresses and   advil aleve products and  Elevation  if needed .Marland Kitchen Will do colon referral .    Health Maintenance, Female Adopting a healthy lifestyle and getting preventive care can go a long way to promote health and wellness. Talk with your health care provider about what schedule of regular examinations is right for you. This is a good chance for you to check in with your provider about disease prevention and staying healthy. In between checkups, there are plenty of things you can do on your own. Experts have done a lot of research about which lifestyle changes and preventive measures are most likely to keep you healthy. Ask your health care provider for more information. WEIGHT AND DIET  Eat a healthy diet  Be sure to include plenty of vegetables, fruits, low-fat dairy products, and lean protein.  Do not eat a lot of foods high in solid fats, added sugars, or salt.  Get regular exercise. This is one of the most important things you can do for your health.  Most adults should exercise for at least 150 minutes each week. The exercise should increase your heart rate and make you sweat (moderate-intensity exercise).  Most adults should also do strengthening exercises at least twice a week. This is in addition to the moderate-intensity exercise.  Maintain a healthy weight  Body mass index (BMI) is a measurement that can be used to identify possible weight problems. It estimates body fat based on height and weight. Your health care provider can help determine your BMI and help you achieve or maintain a healthy weight.  For females 34 years of age and older:   A BMI below 18.5 is considered underweight.  A BMI of 18.5 to 24.9 is normal.  A BMI of 25 to 29.9 is considered overweight.  A BMI of 30 and above is considered obese.  Watch levels of  cholesterol and blood lipids  You should start having your blood tested for lipids and cholesterol at 52 years of age, then have this test every 5 years.  You may need to have your cholesterol levels checked more often if:  Your lipid or cholesterol levels are high.  You are older than 52 years of age.  You are at high risk for heart disease.  CANCER SCREENING   Lung Cancer  Lung cancer screening is recommended for adults 70-10 years old who are at high risk for lung cancer because of a history of smoking.  A yearly low-dose CT scan of the lungs is recommended for people who:  Currently smoke.  Have quit within the past 15 years.  Have at least a 30-pack-year history of smoking. A pack year is smoking an average of one pack of cigarettes a day for 1 year.  Yearly screening should continue until it has been 15 years since you quit.  Yearly screening should stop if you develop a health problem that would prevent you from having lung cancer treatment.  Breast Cancer  Practice breast self-awareness. This means understanding how your breasts normally appear and feel.  It also means doing regular breast self-exams. Let your health care provider know about any changes, no matter how small.  If you are in your 20s or 30s, you should have a clinical breast exam (CBE) by a health care provider every 1-3 years as  part of a regular health exam.  If you are 40 or older, have a CBE every year. Also consider having a breast X-ray (mammogram) every year.  If you have a family history of breast cancer, talk to your health care provider about genetic screening.  If you are at high risk for breast cancer, talk to your health care provider about having an MRI and a mammogram every year.  Breast cancer gene (BRCA) assessment is recommended for women who have family members with BRCA-related cancers. BRCA-related cancers include:  Breast.  Ovarian.  Tubal.  Peritoneal  cancers.  Results of the assessment will determine the need for genetic counseling and BRCA1 and BRCA2 testing. Cervical Cancer Your health care provider may recommend that you be screened regularly for cancer of the pelvic organs (ovaries, uterus, and vagina). This screening involves a pelvic examination, including checking for microscopic changes to the surface of your cervix (Pap test). You may be encouraged to have this screening done every 3 years, beginning at age 70.  For women ages 87-65, health care providers may recommend pelvic exams and Pap testing every 3 years, or they may recommend the Pap and pelvic exam, combined with testing for human papilloma virus (HPV), every 5 years. Some types of HPV increase your risk of cervical cancer. Testing for HPV may also be done on women of any age with unclear Pap test results.  Other health care providers may not recommend any screening for nonpregnant women who are considered low risk for pelvic cancer and who do not have symptoms. Ask your health care provider if a screening pelvic exam is right for you.  If you have had past treatment for cervical cancer or a condition that could lead to cancer, you need Pap tests and screening for cancer for at least 20 years after your treatment. If Pap tests have been discontinued, your risk factors (such as having a new sexual partner) need to be reassessed to determine if screening should resume. Some women have medical problems that increase the chance of getting cervical cancer. In these cases, your health care provider may recommend more frequent screening and Pap tests. Colorectal Cancer  This type of cancer can be detected and often prevented.  Routine colorectal cancer screening usually begins at 52 years of age and continues through 53 years of age.  Your health care provider may recommend screening at an earlier age if you have risk factors for colon cancer.  Your health care provider may also  recommend using home test kits to check for hidden blood in the stool.  A small camera at the end of a tube can be used to examine your colon directly (sigmoidoscopy or colonoscopy). This is done to check for the earliest forms of colorectal cancer.  Routine screening usually begins at age 41.  Direct examination of the colon should be repeated every 5-10 years through 52 years of age. However, you may need to be screened more often if early forms of precancerous polyps or small growths are found. Skin Cancer  Check your skin from head to toe regularly.  Tell your health care provider about any new moles or changes in moles, especially if there is a change in a mole's shape or color.  Also tell your health care provider if you have a mole that is larger than the size of a pencil eraser.  Always use sunscreen. Apply sunscreen liberally and repeatedly throughout the day.  Protect yourself by wearing long sleeves, pants,  a wide-brimmed hat, and sunglasses whenever you are outside. HEART DISEASE, DIABETES, AND HIGH BLOOD PRESSURE   High blood pressure causes heart disease and increases the risk of stroke. High blood pressure is more likely to develop in:  People who have blood pressure in the high end of the normal range (130-139/85-89 mm Hg).  People who are overweight or obese.  People who are African American.  If you are 67-69 years of age, have your blood pressure checked every 3-5 years. If you are 11 years of age or older, have your blood pressure checked every year. You should have your blood pressure measured twice--once when you are at a hospital or clinic, and once when you are not at a hospital or clinic. Record the average of the two measurements. To check your blood pressure when you are not at a hospital or clinic, you can use:  An automated blood pressure machine at a pharmacy.  A home blood pressure monitor.  If you are between 13 years and 75 years old, ask your health  care provider if you should take aspirin to prevent strokes.  Have regular diabetes screenings. This involves taking a blood sample to check your fasting blood sugar level.  If you are at a normal weight and have a low risk for diabetes, have this test once every three years after 52 years of age.  If you are overweight and have a high risk for diabetes, consider being tested at a younger age or more often. PREVENTING INFECTION  Hepatitis B  If you have a higher risk for hepatitis B, you should be screened for this virus. You are considered at high risk for hepatitis B if:  You were born in a country where hepatitis B is common. Ask your health care provider which countries are considered high risk.  Your parents were born in a high-risk country, and you have not been immunized against hepatitis B (hepatitis B vaccine).  You have HIV or AIDS.  You use needles to inject street drugs.  You live with someone who has hepatitis B.  You have had sex with someone who has hepatitis B.  You get hemodialysis treatment.  You take certain medicines for conditions, including cancer, organ transplantation, and autoimmune conditions. Hepatitis C  Blood testing is recommended for:  Everyone born from [birth year] through 1967.  Anyone with known risk factors for hepatitis C. Sexually transmitted infections (STIs)  You should be screened for sexually transmitted infections (STIs) including gonorrhea and chlamydia if:  You are sexually active and are younger than 52 years of age.  You are older than 52 years of age and your health care provider tells you that you are at risk for this type of infection.  Your sexual activity has changed since you were last screened and you are at an increased risk for chlamydia or gonorrhea. Ask your health care provider if you are at risk.  If you do not have HIV, but are at risk, it may be recommended that you take a prescription medicine daily to prevent HIV  infection. This is called pre-exposure prophylaxis (PrEP). You are considered at risk if:  You are sexually active and do not regularly use condoms or know the HIV status of your partner(s).  You take drugs by injection.  You are sexually active with a partner who has HIV. Talk with your health care provider about whether you are at high risk of being infected with HIV. If you choose to begin  PrEP, you should first be tested for HIV. You should then be tested every 3 months for as long as you are taking PrEP.  PREGNANCY   If you are premenopausal and you may become pregnant, ask your health care provider about preconception counseling.  If you may become pregnant, take 400 to 800 micrograms (mcg) of folic acid every day.  If you want to prevent pregnancy, talk to your health care provider about birth control (contraception). OSTEOPOROSIS AND MENOPAUSE   Osteoporosis is a disease in which the bones lose minerals and strength with aging. This can result in serious bone fractures. Your risk for osteoporosis can be identified using a bone density scan.  If you are 69 years of age or older, or if you are at risk for osteoporosis and fractures, ask your health care provider if you should be screened.  Ask your health care provider whether you should take a calcium or vitamin D supplement to lower your risk for osteoporosis.  Menopause may have certain physical symptoms and risks.  Hormone replacement therapy may reduce some of these symptoms and risks. Talk to your health care provider about whether hormone replacement therapy is right for you.  HOME CARE INSTRUCTIONS   Schedule regular health, dental, and eye exams.  Stay current with your immunizations.   Do not use any tobacco products including cigarettes, chewing tobacco, or electronic cigarettes.  If you are pregnant, do not drink alcohol.  If you are breastfeeding, limit how much and how often you drink alcohol.  Limit  alcohol intake to no more than 1 drink per day for nonpregnant women. One drink equals 12 ounces of beer, 5 ounces of wine, or 1 ounces of hard liquor.  Do not use street drugs.  Do not share needles.  Ask your health care provider for help if you need support or information about quitting drugs.  Tell your health care provider if you often feel depressed.  Tell your health care provider if you have ever been abused or do not feel safe at home.   This information is not intended to replace advice given to you by your health care provider. Make sure you discuss any questions you have with your health care provider.   Document Released: 05/04/2011 Document Revised: 11/09/2014 Document Reviewed: 09/20/2013 Elsevier Interactive Patient Education [employer].

## 2016-02-19 NOTE — Progress Notes (Signed)
Chief Complaint  Patient presents with  . Annual Exam    on meds for insomnia and adhd     HPI: Patient  Tiffany Carson  50 y.o. comes in today for Preventive Health Care visit  To go back to school   [place] [employer].  Next year  Here for check up Under care for sleep  suboptimal and  adhd    Tremor no change working on Continue lifestyle intervention healthy eating and exercise .    Health Maintenance  Topic Date Due  . Hepatitis C Screening  [birth year]  . HIV Screening  06/01/1979  . COLONOSCOPY  05/31/2014  . PAP SMEAR  10/11/2014  . INFLUENZA VACCINE  06/02/2016  . MAMMOGRAM  10/08/2016  . TETANUS/TDAP  10/11/2021   Health Maintenance Review LIFESTYLE:  Exercise:   Not much recenetly but will do this Tobacco/ETS: no Alcohol: social rare Sugar beverages: rare  Sleep:  Still bad  .  dohmier . ambien 12. 5   To help .  Drug use: no  ROS:  2 weeks of right leg with sorea rea over VV no swelling or trauma  GEN/ HEENT: No fever, significant weight changes sweats headaches vision problems hearing changes, CV/ PULM; No chest pain shortness of breath cough, syncope,edema  change in exercise tolerance. GI /GU: No adominal pain, vomiting, change in bowel habits. No blood in the stool. No significant GU symptoms. SKIN/HEME: ,no acute skin rashes suspicious lesions or bleeding. No lymphadenopathy, nodules, masses.  NEURO/ PSYCH:  No neurologic signs such as weakness numbness. No depression anxiety. other IMM/ Allergy: No unusual infections.  Allergy .   REST of 12 system review negative except as per HPI   Past Medical History  Diagnosis Date  . FIBROIDS, UTERUS 09/03/2010  . VITAMIN D DEFICIENCY 06/02/2010  . HYPERLIPIDEMIA 08/08/2007  . OBESITY 06/24/2009  . ANEMIA, IRON DEFICIENCY 07/02/2010  . GERD 08/08/2007  . Irregular menstrual cycle 06/24/2009  . LEG PAIN, RIGHT 10/03/2010  . LEG CRAMPS 06/24/2009  . SLEEPLESSNESS 11/16/2007  . FATIGUE 06/24/2009  . TREMOR 07/02/2010  .  TINGLING 10/03/2010  . COUGH, CHRONIC 04/09/2010  . ABDOMINAL PAIN RIGHT LOWER QUADRANT 10/03/2010  . Acquired absence of both cervix and uterus 10/03/2010    Past Surgical History  Procedure Laterality Date  . Cholecystectomy    . Oophorectomy      left   salpingesctomy   . Abdominal hysterectomy  nov 2011    secondary infection  had adhesions  . Breast reduction surgery  09 20 12  . Breast surgery      Family History  Problem Relation Age of Onset  . Arthritis    . Hypertension    . Diabetes Mother   . Hypertension Mother   . Diabetes Brother 79    2013, type 2    Social History   Social History  . Marital Status: Single    Spouse Name: n/a  . Number of Children: 0  . Years of Education: 16   Occupational History  . NUCLEAR MEDICINE     [employer]   Social History Main Topics  . Smoking status: Never Smoker   . Smokeless tobacco: Never Used  . Alcohol Use: 0.0 - 0.6 oz/week    0-1 Standard drinks or equivalent per week     Comment: rarely  . Drug Use: No  . Sexual Activity: No   Other Topics Concern  . None   Social History  Narrative   Lives alone, with 1 dog.   Cousins live nearby.  Siblings are in middle [place].   Neg td  ocass etoh 1 caffeine per day    40 hours  Per week.    Patient is right-handed.   Patient has a Haematologist.             Outpatient Prescriptions Prior to Visit  Medication Sig Dispense Refill  . chlorpheniramine-HYDROcodone (TUSSIONEX PENNKINETIC ER) 10-8 MG/5ML SUER Take 5 mLs by mouth every 12 (twelve) hours as needed for cough. 100 mL 0  . ibuprofen (ADVIL,MOTRIN) 200 MG tablet Take 200 mg by mouth every 6 (six) hours as needed.      . Vitamin D, Cholecalciferol, 400 UNITS CHEW Chew by mouth.     Marland Kitchen VYVANSE 20 MG capsule Take 20 mg by mouth daily at 3 pm.  0  . VYVANSE 40 MG capsule Take 40 mg by mouth every morning.  0  . zolpidem (AMBIEN CR) 12.5 MG CR tablet TAKE 1 TABLET BY MOUTH AT BEDTIME AS  NEEDED 90 tablet 1  . albuterol (PROVENTIL HFA;VENTOLIN HFA) 108 (90 BASE) MCG/ACT inhaler Inhale 2 puffs into the lungs every 4 (four) hours as needed for wheezing or shortness of breath (cough, shortness of breath or wheezing.). 1 Inhaler 1  . benzonatate (TESSALON) 100 MG capsule Take 1-2 capsules (100-200 mg total) by mouth 3 (three) times daily as needed. 30 capsule 0  . botulinum toxin Type A (BOTOX) 100 UNITS SOLR injection 100 units will be administered in the office by provider to right muscle for dystonia every 3 months. (Patient not taking: Reported on 10/01/2015) 1 vial 1  . Guaifenesin (MUCINEX MAXIMUM STRENGTH) 1200 MG TB12 Take 1 tablet (1,200 mg total) by mouth every 12 (twelve) hours as needed. 14 tablet 1  . HYDROcodone-homatropine (HYCODAN) 5-1.5 MG/5ML syrup Take 5 mLs by mouth every 4 (four) hours as needed. 120 mL 0  . lisdexamfetamine (VYVANSE) 30 MG capsule Take 30 mg by mouth daily.    Marland Kitchen omeprazole (PRILOSEC) 20 MG capsule Take 20 mg by mouth as needed.     No facility-administered medications prior to visit.     EXAM:  BP 120/80 mmHg  Temp(Src) 98.3 F (36.8 C) (Oral)  Ht _0  (1.575 m)  Wt 178 lb 6.4 oz (80.922 kg)  BMI 32.62 kg/m2  Body mass index is 32.62 kg/(m^2).  Physical Exam: Vital signs reviewed TTS:VXBL is a well-developed well-nourished alert cooperative    who appearsr stated age in no acute distress.  HEENT: normocephalic atraumatic , Eyes: PERRL EOM's full, conjunctiva clear, Nares: paten,t no deformity discharge or tenderness., Ears: no deformity EAC's clear TMs with normal landmarks. Mouth: clear OP, no lesions, edema.  Moist mucous membranes. Dentition in adequate repair. NECK: supple without masses, thyromegaly or bruits. CHEST/PULM:  Clear to auscultation and percussion breath sounds equal no wheeze , rales or rhonchi. No chest wall deformities or tenderness. CV: PMI is nondisplaced, S1 S2 no gallops, murmurs, rubs. Peripheral pulses are  full without delay.No JVD . Breast: normal by inspection . No dimpling, discharge, masses, tenderness or discharge .weel healed scars  Breast reduction ABDOMEN: Bowel sounds normal nontender  No guard or rebound, no hepato splenomegal no CVA tenderness.  No hernia. Extremtities:  No clubbing cyanosis or edema, no acute joint swelling or redness no focal atrophy VV bilaterally right medical calf with  midl tendernes prominent vv  About 4 cm firm  NEURO:  Oriented x3, cranial nerves 3-12 appear to be intact, no obvious focal weakness,gait within normal limits no abnormal reflexes or asymmetrical SKIN: No acute rashes normal turgor, color, no bruising or petechiae. PSYCH: Oriented, good eye contact, no obvious depression anxiety, cognition and judgment appear normal. LN: no cervical axillary inguinal adenopathy  Lab Results  Component Value Date   WBC 8.8 02/11/2016   HGB 14.2 02/11/2016   HCT 41.9 02/11/2016   PLT 403.0* 02/11/2016   GLUCOSE 84 02/11/2016   CHOL 232* 02/11/2016   TRIG 95.0 02/11/2016   HDL 59.00 02/11/2016   LDLDIRECT 135.4 06/27/2012   LDLCALC 154* 02/11/2016   ALT 22 02/11/2016   AST 18 02/11/2016   NA 140 02/11/2016   K 4.3 02/11/2016   CL 104 02/11/2016   CREATININE 0.65 02/11/2016   BUN 15 02/11/2016   CO2 27 02/11/2016   TSH 1.47 02/11/2016   HGBA1C 5.7 06/27/2012   Wt Readings from Last 3 Encounters:  02/19/16 178 lb 6.4 oz (80.922 kg)  10/14/15 182 lb 6.4 oz (82.736 kg)  10/01/15 185 lb (83.915 kg)    ASSESSMENT AND PLAN:  Discussed the following assessment and plan:  Visit for preventive health examination  Phlebitis ? - poss superficial vt ? disc get doppler and  plan fu  - Plan: LE VENOUS  Varicose vein of leg - Plan: LE VENOUS  HLD (hyperlipidemia) - improved continue lsi   Colon cancer screening - Plan: Ambulatory referral to Gastroenterology  Fasting hyperglycemia - improved with lsi   Patient Care Team: Burnis Medin, MD as PCP -  General Princess Bruins, MD (Obstetrics and Gynecology) Crissie Reese, MD (Plastic Surgery) Larey Seat, MD (Neurology) Thurman Coyer, DO [facility]) Patient Instructions  Continue lifestyle intervention healthy eating and exercise . Will get a doppler  Right leg   Probably.  Superficial   Phlebitis    Warm compresses and   advil aleve products and  Elevation  if needed .Marland Kitchen Will do colon referral .    Health Maintenance, Female Adopting a healthy lifestyle and getting preventive care can go a long way to promote health and wellness. Talk with your health care provider about what schedule of regular examinations is right for you. This is a good chance for you to check in with your provider about disease prevention and staying healthy. In between checkups, there are plenty of things you can do on your own. Experts have done a lot of research about which lifestyle changes and preventive measures are most likely to keep you healthy. Ask your health care provider for more information. WEIGHT AND DIET  Eat a healthy diet  Be sure to include plenty of vegetables, fruits, low-fat dairy products, and lean protein.  Do not eat a lot of foods high in solid fats, added sugars, or salt.  Get regular exercise. This is one of the most important things you can do for your health.  Most adults should exercise for at least 150 minutes each week. The exercise should increase your heart rate and make you sweat (moderate-intensity exercise).  Most adults should also do strengthening exercises at least twice a week. This is in addition to the moderate-intensity exercise.  Maintain a healthy weight  Body mass index (BMI) is a measurement that can be used to identify possible weight problems. It estimates body fat based on height and weight. Your health care provider can help determine your BMI and help you achieve or maintain a healthy weight.  For females 44 years of age and older:   A BMI  below 18.5 is considered underweight.  A BMI of 18.5 to 24.9 is normal.  A BMI of 25 to 29.9 is considered overweight.  A BMI of 30 and above is considered obese.  Watch levels of cholesterol and blood lipids  You should start having your blood tested for lipids and cholesterol at 52 years of age, then have this test every 5 years.  You may need to have your cholesterol levels checked more often if:  Your lipid or cholesterol levels are high.  You are older than 52 years of age.  You are at high risk for heart disease.  CANCER SCREENING   Lung Cancer  Lung cancer screening is recommended for adults 61-52 years old who are at high risk for lung cancer because of a history of smoking.  A yearly low-dose CT scan of the lungs is recommended for people who:  Currently smoke.  Have quit within the past 15 years.  Have at least a 30-pack-year history of smoking. A pack year is smoking an average of one pack of cigarettes a day for 1 year.  Yearly screening should continue until it has been 15 years since you quit.  Yearly screening should stop if you develop a health problem that would prevent you from having lung cancer treatment.  Breast Cancer  Practice breast self-awareness. This means understanding how your breasts normally appear and feel.  It also means doing regular breast self-exams. Let your health care provider know about any changes, no matter how small.  If you are in your 20s or 30s, you should have a clinical breast exam (CBE) by a health care provider every 1-3 years as part of a regular health exam.  If you are 58 or older, have a CBE every year. Also consider having a breast X-ray (mammogram) every year.  If you have a family history of breast cancer, talk to your health care provider about genetic screening.  If you are at high risk for breast cancer, talk to your health care provider about having an MRI and a mammogram every year.  Breast cancer gene  (BRCA) assessment is recommended for women who have family members with BRCA-related cancers. BRCA-related cancers include:  Breast.  Ovarian.  Tubal.  Peritoneal cancers.  Results of the assessment will determine the need for genetic counseling and BRCA1 and BRCA2 testing. Cervical Cancer Your health care provider may recommend that you be screened regularly for cancer of the pelvic organs (ovaries, uterus, and vagina). This screening involves a pelvic examination, including checking for microscopic changes to the surface of your cervix (Pap test). You may be encouraged to have this screening done every 3 years, beginning at age 2.  For women ages 99-65, health care providers may recommend pelvic exams and Pap testing every 3 years, or they may recommend the Pap and pelvic exam, combined with testing for human papilloma virus (HPV), every 5 years. Some types of HPV increase your risk of cervical cancer. Testing for HPV may also be done on women of any age with unclear Pap test results.  Other health care providers may not recommend any screening for nonpregnant women who are considered low risk for pelvic cancer and who do not have symptoms. Ask your health care provider if a screening pelvic exam is right for you.  If you have had past treatment for cervical cancer or a condition that could lead to cancer, you  need Pap tests and screening for cancer for at least 20 years after your treatment. If Pap tests have been discontinued, your risk factors (such as having a new sexual partner) need to be reassessed to determine if screening should resume. Some women have medical problems that increase the chance of getting cervical cancer. In these cases, your health care provider may recommend more frequent screening and Pap tests. Colorectal Cancer  This type of cancer can be detected and often prevented.  Routine colorectal cancer screening usually begins at 52 years of age and continues through  52 years of age.  Your health care provider may recommend screening at an earlier age if you have risk factors for colon cancer.  Your health care provider may also recommend using home test kits to check for hidden blood in the stool.  A small camera at the end of a tube can be used to examine your colon directly (sigmoidoscopy or colonoscopy). This is done to check for the earliest forms of colorectal cancer.  Routine screening usually begins at age 87.  Direct examination of the colon should be repeated every 5-10 years through 52 years of age. However, you may need to be screened more often if early forms of precancerous polyps or small growths are found. Skin Cancer  Check your skin from head to toe regularly.  Tell your health care provider about any new moles or changes in moles, especially if there is a change in a mole's shape or color.  Also tell your health care provider if you have a mole that is larger than the size of a pencil eraser.  Always use sunscreen. Apply sunscreen liberally and repeatedly throughout the day.  Protect yourself by wearing long sleeves, pants, a wide-brimmed hat, and sunglasses whenever you are outside. HEART DISEASE, DIABETES, AND HIGH BLOOD PRESSURE   High blood pressure causes heart disease and increases the risk of stroke. High blood pressure is more likely to develop in:  People who have blood pressure in the high end of the normal range (130-139/85-89 mm Hg).  People who are overweight or obese.  People who are African American.  If you are 51-14 years of age, have your blood pressure checked every 3-5 years. If you are 70 years of age or older, have your blood pressure checked every year. You should have your blood pressure measured twice--once when you are at a hospital or clinic, and once when you are not at a hospital or clinic. Record the average of the two measurements. To check your blood pressure when you are not at a hospital or  clinic, you can use:  An automated blood pressure machine at a pharmacy.  A home blood pressure monitor.  If you are between 64 years and 49 years old, ask your health care provider if you should take aspirin to prevent strokes.  Have regular diabetes screenings. This involves taking a blood sample to check your fasting blood sugar level.  If you are at a normal weight and have a low risk for diabetes, have this test once every three years after 52 years of age.  If you are overweight and have a high risk for diabetes, consider being tested at a younger age or more often. PREVENTING INFECTION  Hepatitis B  If you have a higher risk for hepatitis B, you should be screened for this virus. You are considered at high risk for hepatitis B if:  You were born in a country where hepatitis B  is common. Ask your health care provider which countries are considered high risk.  Your parents were born in a high-risk country, and you have not been immunized against hepatitis B (hepatitis B vaccine).  You have HIV or AIDS.  You use needles to inject street drugs.  You live with someone who has hepatitis B.  You have had sex with someone who has hepatitis B.  You get hemodialysis treatment.  You take certain medicines for conditions, including cancer, organ transplantation, and autoimmune conditions. Hepatitis C  Blood testing is recommended for:  Everyone born from [birth year] through 1967.  Anyone with known risk factors for hepatitis C. Sexually transmitted infections (STIs)  You should be screened for sexually transmitted infections (STIs) including gonorrhea and chlamydia if:  You are sexually active and are younger than 52 years of age.  You are older than 52 years of age and your health care provider tells you that you are at risk for this type of infection.  Your sexual activity has changed since you were last screened and you are at an increased risk for chlamydia or gonorrhea. Ask  your health care provider if you are at risk.  If you do not have HIV, but are at risk, it may be recommended that you take a prescription medicine daily to prevent HIV infection. This is called pre-exposure prophylaxis (PrEP). You are considered at risk if:  You are sexually active and do not regularly use condoms or know the HIV status of your partner(s).  You take drugs by injection.  You are sexually active with a partner who has HIV. Talk with your health care provider about whether you are at high risk of being infected with HIV. If you choose to begin PrEP, you should first be tested for HIV. You should then be tested every 3 months for as long as you are taking PrEP.  PREGNANCY   If you are premenopausal and you may become pregnant, ask your health care provider about preconception counseling.  If you may become pregnant, take 400 to 800 micrograms (mcg) of folic acid every day.  If you want to prevent pregnancy, talk to your health care provider about birth control (contraception). OSTEOPOROSIS AND MENOPAUSE   Osteoporosis is a disease in which the bones lose minerals and strength with aging. This can result in serious bone fractures. Your risk for osteoporosis can be identified using a bone density scan.  If you are 26 years of age or older, or if you are at risk for osteoporosis and fractures, ask your health care provider if you should be screened.  Ask your health care provider whether you should take a calcium or vitamin D supplement to lower your risk for osteoporosis.  Menopause may have certain physical symptoms and risks.  Hormone replacement therapy may reduce some of these symptoms and risks. Talk to your health care provider about whether hormone replacement therapy is right for you.  HOME CARE INSTRUCTIONS   Schedule regular health, dental, and eye exams.  Stay current with your immunizations.   Do not use any tobacco products including cigarettes, chewing  tobacco, or electronic cigarettes.  If you are pregnant, do not drink alcohol.  If you are breastfeeding, limit how much and how often you drink alcohol.  Limit alcohol intake to no more than 1 drink per day for nonpregnant women. One drink equals 12 ounces of beer, 5 ounces of wine, or 1 ounces of hard liquor.  Do not use street drugs.  Do not share needles.  Ask your health care provider for help if you need support or information about quitting drugs.  Tell your health care provider if you often feel depressed.  Tell your health care provider if you have ever been abused or do not feel safe at home.   This information is not intended to replace advice given to you by your health care provider. Make sure you discuss any questions you have with your health care provider.   Document Released: 05/04/2011 Document Revised: 11/09/2014 Document Reviewed: 09/20/2013 Elsevier Interactive Patient Education 2016 [place]. Areyanna Figeroa M.D.

## 2016-02-20 ENCOUNTER — Ambulatory Visit (HOSPITAL_COMMUNITY)
Admission: RE | Admit: 2016-02-20 | Discharge: 2016-02-20 | Disposition: A | Discharge status: Home / Self Care | Payer: 59 | Source: Ambulatory Visit | Attending: Cardiology | Admitting: Cardiology

## 2016-02-20 DIAGNOSIS — M79661 Pain in right lower leg: Secondary | ICD-10-CM | POA: Diagnosis not present

## 2016-02-20 DIAGNOSIS — K219 Gastro-esophageal reflux disease without esophagitis: Secondary | ICD-10-CM | POA: Insufficient documentation

## 2016-02-20 DIAGNOSIS — M7989 Other specified soft tissue disorders: Secondary | ICD-10-CM

## 2016-02-20 DIAGNOSIS — I809 Phlebitis and thrombophlebitis of unspecified site: Secondary | ICD-10-CM | POA: Insufficient documentation

## 2016-02-20 DIAGNOSIS — I872 Venous insufficiency (chronic) (peripheral): Secondary | ICD-10-CM | POA: Diagnosis not present

## 2016-02-20 DIAGNOSIS — E785 Hyperlipidemia, unspecified: Secondary | ICD-10-CM | POA: Diagnosis not present

## 2016-02-20 DIAGNOSIS — I8393 Asymptomatic varicose veins of bilateral lower extremities: Secondary | ICD-10-CM | POA: Insufficient documentation

## 2016-02-20 DIAGNOSIS — I8390 Asymptomatic varicose veins of unspecified lower extremity: Secondary | ICD-10-CM

## 2016-02-21 ENCOUNTER — Other Ambulatory Visit: Payer: Self-pay | Admitting: Family Medicine

## 2016-02-21 DIAGNOSIS — I872 Venous insufficiency (chronic) (peripheral): Secondary | ICD-10-CM

## 2016-02-25 ENCOUNTER — Encounter: Payer: Self-pay | Admitting: Gastroenterology

## 2016-03-09 ENCOUNTER — Other Ambulatory Visit: Payer: Self-pay | Admitting: Vascular Surgery

## 2016-03-09 ENCOUNTER — Other Ambulatory Visit: Payer: Self-pay | Admitting: Internal Medicine

## 2016-03-09 DIAGNOSIS — I872 Venous insufficiency (chronic) (peripheral): Secondary | ICD-10-CM

## 2016-03-17 ENCOUNTER — Encounter: Payer: Self-pay | Admitting: Vascular Surgery

## 2016-03-24 ENCOUNTER — Encounter: Payer: Self-pay | Admitting: Vascular Surgery

## 2016-03-24 ENCOUNTER — Ambulatory Visit (HOSPITAL_COMMUNITY)
Admission: RE | Admit: 2016-03-24 | Discharge: 2016-03-24 | Disposition: A | Discharge status: Home / Self Care | Payer: 59 | Source: Ambulatory Visit | Attending: Vascular Surgery | Admitting: Vascular Surgery

## 2016-03-24 ENCOUNTER — Ambulatory Visit (INDEPENDENT_AMBULATORY_CARE_PROVIDER_SITE_OTHER): Payer: 59 | Admitting: Vascular Surgery

## 2016-03-24 VITALS — BP 133/84 | HR 117 | Temp 98.0°F | Resp 16 | Ht 62.0 in | Wt 176.0 lb

## 2016-03-24 DIAGNOSIS — I83892 Varicose veins of left lower extremities with other complications: Secondary | ICD-10-CM | POA: Insufficient documentation

## 2016-03-24 DIAGNOSIS — I83891 Varicose veins of right lower extremities with other complications: Secondary | ICD-10-CM

## 2016-03-24 DIAGNOSIS — I872 Venous insufficiency (chronic) (peripheral): Secondary | ICD-10-CM | POA: Diagnosis not present

## 2016-03-24 NOTE — Progress Notes (Signed)
Subjective:     Patient ID: Tiffany Carson, female   DOB: 07-06-1966, 50 y.o.   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 Regis Bill for evaluation of painful varicosities in the right leg. This patient has had enlarging varicose veins in the medial thigh and calf area for the past 3 or 4 years. The ones in the thigh area have become much more prominent recently. Her leg develops aching throbbing and burning discomfort as the day progresses as well as some edema in the right ankle. The skin becomes hypersensitive. Occasionally at night she Snook up and walk around because of "restless legs". She has no history of DVT thrombophlebitis stasis ulcers or bleeding. She does not were elastic compression stockings. She has noticed some bulges in the left leg that the symptoms are not nearly as severe.  Past Medical History  Diagnosis Date  . FIBROIDS, UTERUS 09/03/2010  . VITAMIN D DEFICIENCY 06/02/2010  . HYPERLIPIDEMIA 08/08/2007  . OBESITY 06/24/2009  . ANEMIA, IRON DEFICIENCY 07/02/2010  . GERD 08/08/2007  . Irregular menstrual cycle 06/24/2009  . LEG PAIN, RIGHT 10/03/2010  . LEG CRAMPS 06/24/2009  . SLEEPLESSNESS 11/16/2007  . FATIGUE 06/24/2009  . TREMOR 07/02/2010  . TINGLING 10/03/2010  . COUGH, CHRONIC 04/09/2010  . ABDOMINAL PAIN RIGHT LOWER QUADRANT 10/03/2010  . Acquired absence of both cervix and uterus 10/03/2010  . ADHD (attention deficit hyperactivity disorder)     per dr Johnnye Sima  see notes    Social History  Substance Use Topics  . Smoking status: Never Smoker   . Smokeless tobacco: Never Used  . Alcohol Use: 0.0 - 0.6 oz/week    0-1 Standard drinks or equivalent per week     Comment: rarely    Family History  Problem Relation Age of Onset  . Arthritis    . Hypertension    . Diabetes Mother   . Hypertension Mother   . Diabetes Brother 52    2013, type 2    No Known Allergies   Current outpatient prescriptions:  .  ibuprofen  (ADVIL,MOTRIN) 200 MG tablet, Take 200 mg by mouth every 6 (six) hours as needed.  , Disp: , Rfl:  .  Vitamin D, Cholecalciferol, 400 UNITS CHEW, Chew by mouth. , Disp: , Rfl:  .  VYVANSE 20 MG capsule, Take 20 mg by mouth daily at 3 pm., Disp: , Rfl: 0 .  VYVANSE 40 MG capsule, Take 40 mg by mouth every morning., Disp: , Rfl: 0 .  zolpidem (AMBIEN CR) 12.5 MG CR tablet, TAKE 1 TABLET BY MOUTH AT BEDTIME AS NEEDED, Disp: 90 tablet, Rfl: 1 .  chlorpheniramine-HYDROcodone (TUSSIONEX PENNKINETIC ER) 10-8 MG/5ML SUER, Take 5 mLs by mouth every 12 (twelve) hours as needed for cough. (Patient not taking: Reported on 03/24/2016), Disp: 100 mL, Rfl: 0  Filed Vitals:   03/24/16 1227  BP: 133/84  Pulse: 117  Temp: 98 F (36.7 C)  Resp: 16  Height: 5\' 2"  (1.575 m)  Weight: 176 lb (79.833 kg)  SpO2: 98%    Body mass index is 32.18 kg/(m^2).        \   Review of Systems denies chest pain, dyspnea on exertion, PND, orthopnea, lateralizing weakness, aphasia, amaurosis days, diplopia, blurred vision, or syncope. Does have ADHD, GERD, and hyperlipidemia. Other systems negative and complete review of systems     Objective:   Physical Exam BP 133/84 mmHg  Pulse 117  Temp(Src) 98 F (  36.7 C)  Resp 16  Ht 5\' 2"  (1.575 m)  Wt 176 lb (79.833 kg)  BMI 32.18 kg/m2  SpO2 98%    Gen.-alert and oriented x3 in no apparent distress HEENT normal for age Lungs no rhonchi or wheezing Cardiovascular regular rhythm no murmurs carotid pulses 3+ palpable no bruits audible Abdomen soft nontender no palpable masses-obese Musculoskeletal free of  major deformities Skin clear -no rashes Neurologic normal Lower extremities 3+ femoral and dorsalis pedis pulses palpable bilaterally with no edema on the left 1+ in the right ankle Bulging varicosities right medial thigh over great saphenous system extending into the medial calf and posterior calf down near Achilles tendon. No hyperpigmentation or ulceration  is noted. Both feet adequately perfused. Left leg has a few isolated varicosities in the mid medial calf and posterior calf with no distal edema or hyperpigmentation.  Today I ordered a venous duplex exam of both legs which I reviewed and interpreted. There is no DVT. The right great saphenous vein has gross reflux throughout supplying these painful varicosities. The left great saphenous vein does not have gross reflux at this time.       Assessment:     Painful varicosities right leg which are affecting patient's daily living by causing pain and swelling which worsens as the day progresses. 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 These varicosities are due to gross reflux in the right great saphenous vein GERD ADHD Hyperlipidemia    Plan:         #1 long leg elastic compression stockings 20-30 mm gradient #2 elevate legs as much as possible #3 ibuprofen daily on a regular basis for pain #4 return in 3 months-if no significant improvement then she will need laser ablation right great saphenous vein plus greater than 20 stab phlebectomy of painful varicosities to be performed as a single procedure. Return in 3 months

## 2016-04-13 ENCOUNTER — Ambulatory Visit (AMBULATORY_SURGERY_CENTER): Payer: Self-pay

## 2016-04-13 VITALS — Ht 62.0 in | Wt 181.0 lb

## 2016-04-13 DIAGNOSIS — Z1211 Encounter for screening for malignant neoplasm of colon: Secondary | ICD-10-CM

## 2016-04-13 MED ORDER — NA SULFATE-K SULFATE-MG SULF 17.5-3.13-1.6 GM/177ML PO SOLN: 1.0000 | Freq: Once | ORAL | Status: DC

## 2016-04-13 NOTE — Progress Notes (Signed)
No egg or soy allergy.  No previous complications from anesthesia. No home O2. No diet meds. Emmi video sent to [email or web address]

## 2016-04-24 ENCOUNTER — Telehealth: Payer: Self-pay | Admitting: Gastroenterology

## 2016-04-27 ENCOUNTER — Encounter: Payer: 59 | Admitting: Gastroenterology

## 2016-04-27 DIAGNOSIS — Z79899 Other long term (current) drug therapy: Secondary | ICD-10-CM | POA: Diagnosis not present

## 2016-04-27 DIAGNOSIS — F4010 Social phobia, unspecified: Secondary | ICD-10-CM | POA: Diagnosis not present

## 2016-04-27 DIAGNOSIS — F902 Attention-deficit hyperactivity disorder, combined type: Secondary | ICD-10-CM | POA: Diagnosis not present

## 2016-04-27 NOTE — Telephone Encounter (Signed)
No that's okay, thanks

## 2016-05-04 ENCOUNTER — Ambulatory Visit (INDEPENDENT_AMBULATORY_CARE_PROVIDER_SITE_OTHER): Payer: 59 | Admitting: Internal Medicine

## 2016-05-04 ENCOUNTER — Encounter: Payer: Self-pay | Admitting: Internal Medicine

## 2016-05-04 VITALS — BP 126/78 | Temp 98.1°F | Wt 177.3 lb

## 2016-05-04 DIAGNOSIS — R5383 Other fatigue: Secondary | ICD-10-CM | POA: Diagnosis not present

## 2016-05-04 DIAGNOSIS — E611 Iron deficiency: Secondary | ICD-10-CM | POA: Diagnosis not present

## 2016-05-04 DIAGNOSIS — G4700 Insomnia, unspecified: Secondary | ICD-10-CM | POA: Diagnosis not present

## 2016-05-04 LAB — CBC WITH DIFFERENTIAL/PLATELET
Basophils Absolute: 0 cells/uL (ref 0–200)
Basophils Relative: 0 %
EOS PCT: 2 %
Eosinophils Absolute: 198 cells/uL (ref 15–500)
HEMATOCRIT: 45.6 % — AB (ref 35.0–45.0)
Hemoglobin: 15.5 g/dL (ref 11.7–15.5)
LYMPHS PCT: 23 %
Lymphs Abs: 2277 cells/uL (ref 850–3900)
MCH: 30 pg (ref 27.0–33.0)
MCHC: 34 g/dL (ref 32.0–36.0)
MCV: 88.2 fL (ref 80.0–100.0)
MONO ABS: 693 {cells}/uL (ref 200–950)
MONOS PCT: 7 %
MPV: 9.3 fL (ref 7.5–12.5)
NEUTROS PCT: 68 %
Neutro Abs: 6732 cells/uL (ref 1500–7800)
Platelets: 416 10*3/uL — ABNORMAL HIGH (ref 140–400)
RBC: 5.17 MIL/uL — AB (ref 3.80–5.10)
RDW: 13.3 % (ref 11.0–15.0)
WBC: 9.9 10*3/uL (ref 3.8–10.8)

## 2016-05-04 LAB — VITAMIN B12: Vitamin B-12: 521 pg/mL (ref 200–1100)

## 2016-05-04 LAB — FERRITIN: Ferritin: 113 ng/mL (ref 10–232)

## 2016-05-04 NOTE — Patient Instructions (Signed)
Will notify you  of labs when available.      Plan after that .   Continue  Exercise

## 2016-05-04 NOTE — Progress Notes (Signed)
Pre visit review using our clinic review tool, if applicable. No additional management support is needed unless otherwise documented below in the visit note.  Chief Complaint  Patient presents with  . Fatigue  . Dizziness  . Generalized Body Aches  . Restless Legs  . Irritability    HPI: Tiffany Carson 50 y.o.  sda appt  No energy ? If has low iron .   Similar feeling  In the past.   bath shower make her  Tired   And past  paiting   And  standing  On ladder.     No change in meds no fall and no bleeding . Still on amcien for sleep  Had low iron in past and helped her rls  She does have vv also   sees sleep neuro spec and  Nancie Neas for adhd sx  ROS: See pertinent positives and negatives per HPI. No cp sob chills weight loss  bege diet  Past Medical History  Diagnosis Date  . FIBROIDS, UTERUS 09/03/2010  . VITAMIN D DEFICIENCY 06/02/2010  . HYPERLIPIDEMIA 08/08/2007  . OBESITY 06/24/2009  . ANEMIA, IRON DEFICIENCY 07/02/2010  . GERD 08/08/2007  . Irregular menstrual cycle 06/24/2009  . LEG PAIN, RIGHT 10/03/2010  . LEG CRAMPS 06/24/2009  . SLEEPLESSNESS 11/16/2007  . FATIGUE 06/24/2009  . TREMOR 07/02/2010  . TINGLING 10/03/2010  . COUGH, CHRONIC 04/09/2010    pt denies  . ABDOMINAL PAIN RIGHT LOWER QUADRANT 10/03/2010  . Acquired absence of both cervix and uterus 10/03/2010  . ADHD (attention deficit hyperactivity disorder)     per dr Johnnye Sima  see notes    Family History  Problem Relation Age of Onset  . Arthritis    . Hypertension    . Diabetes Mother   . Hypertension Mother   . Diabetes Brother 32    2013, type 2  . Colon cancer Neg Hx     Social History   Social History  . Marital Status: Single    Spouse Name: n/a  . Number of Children: 0  . Years of Education: 16   Occupational History  . NUCLEAR MEDICINE     [employer]   Social History Main Topics  . Smoking status: Never Smoker   . Smokeless tobacco: Never Used  . Alcohol Use:  0.0 - 0.6 oz/week    0-1 Standard drinks or equivalent per week     Comment: rarely  . Drug Use: No  . Sexual Activity: No   Other Topics Concern  . None   Social History Narrative   Lives alone, with 1 dog.   Cousins live nearby.  Siblings are in middle [place].   Neg td  ocass etoh 1 caffeine per day    40 hours  Per week.    Patient is right-handed.   Patient has a Haematologist.             Outpatient Prescriptions Prior to Visit  Medication Sig Dispense Refill  . ibuprofen (ADVIL,MOTRIN) 200 MG tablet Take 200 mg by mouth every 6 (six) hours as needed.      . Vitamin D, Cholecalciferol, 400 UNITS CHEW Chew by mouth.     Marland Kitchen VYVANSE 20 MG capsule Take 20 mg by mouth daily at 3 pm.  0  . VYVANSE 40 MG capsule Take 40 mg by mouth every morning.  0  . zolpidem (AMBIEN CR) 12.5 MG CR tablet TAKE 1 TABLET BY MOUTH AT BEDTIME AS  NEEDED 90 tablet 1  . Na Sulfate-K Sulfate-Mg Sulf (SUPREP BOWEL PREP KIT) 17.5-3.13-1.6 GM/180ML SOLN Take 1 kit by mouth once. Name brand only, no substitutions, take as directed (Patient not taking: Reported on 05/04/2016) 354 mL 0   No facility-administered medications prior to visit.     EXAM:  BP 126/78 mmHg  Temp(Src) 98.1 F (36.7 C) (Oral)  Wt 177 lb 4.8 oz (80.423 kg)  Body mass index is 32.42 kg/(m^2).  GENERAL: vitals reviewed and listed above, alert, oriented, appears well hydrated and in no acute distress HEENT: atraumatic, conjunctiva  clear, no obvious abnormalities on inspection of external nose and ears NECK: no obvious masses on inspection palpation  LUNGS: clear to auscultation bilaterally, no wheezes, rales or rhonchi, good air movement CV: HRRR, no clubbing cyanosis or  peripheral edema nl cap refill   nlpulses  Abdomen:  Sof,t normal bowel sounds without hepatosplenomegaly, no guarding rebound or masses no CVA tenderness  MS: moves all extremities without noticeable focal  abnormality PSYCH: pleasant and cooperative, no  obvious depression or anxiety Lab Results  Component Value Date   WBC 8.8 02/11/2016   HGB 14.2 02/11/2016   HCT 41.9 02/11/2016   PLT 403.0* 02/11/2016   GLUCOSE 84 02/11/2016   CHOL 232* 02/11/2016   TRIG 95.0 02/11/2016   HDL 59.00 02/11/2016   LDLDIRECT 135.4 06/27/2012   LDLCALC 154* 02/11/2016   ALT 22 02/11/2016   AST 18 02/11/2016   NA 140 02/11/2016   K 4.3 02/11/2016   CL 104 02/11/2016   CREATININE 0.65 02/11/2016   BUN 15 02/11/2016   CO2 27 02/11/2016   TSH 1.47 02/11/2016   HGBA1C 5.7 06/27/2012    ASSESSMENT AND PLAN:  Discussed the following assessment and plan:  Other fatigue - Plan: Celiac panel 10, CBC with Differential/Platelet, Ferritin, Vitamin B12, IBC panel, CANCELED: CBC with Differential/Platelet, CANCELED: IBC panel, CANCELED: Ferritin, CANCELED: Vitamin B12  Iron deficiency - Plan: Celiac panel 10, CBC with Differential/Platelet, Ferritin, Vitamin B12, IBC panel, CANCELED: CBC with Differential/Platelet, CANCELED: IBC panel, CANCELED: Ferritin, CANCELED: Vitamin B12  SLEEPLESSNESS prob  multifactorial but does have hx of iron defic and can screen for this as aggravator for pls etc.   Not a restricted diet.  -Patient advised to return or notify health care team  if symptoms worsen ,persist or new concerns arise.  Patient Instructions  Will notify you  of labs when available.      Plan after that .   Continue  Exercise       Standley Brooking. Panosh M.D.

## 2016-05-06 ENCOUNTER — Telehealth: Payer: Self-pay | Admitting: Internal Medicine

## 2016-05-06 LAB — CELIAC PANEL 10
ENDOMYSIAL SCREEN: NEGATIVE
GLIADIN IGG: 2 U (ref <20)
Gliadin IgA: 5 Units (ref <20)
IGA: 111 mg/dL (ref 81–463)
TISSUE TRANSGLUTAMINASE AB, IGA: 1 U/mL (ref <4)
Tissue Transglut Ab: 1 U/mL (ref <6)

## 2016-05-06 LAB — IBC PANEL
%SAT: 23 % (ref 11–50)
TIBC: 327 ug/dL (ref 250–450)
UIBC: 251 ug/dL (ref 125–400)

## 2016-05-06 LAB — IRON: IRON: 76 ug/dL (ref 45–160)

## 2016-05-06 NOTE — Telephone Encounter (Signed)
Tiffany Carson with [facility] would like Dr Regis Bill to know they will have to do an "iron total" in order to get the TIBC. So they changed the order.

## 2016-05-07 NOTE — Telephone Encounter (Signed)
Thanks

## 2016-06-15 ENCOUNTER — Ambulatory Visit: Payer: 59 | Admitting: Neurology

## 2016-06-16 ENCOUNTER — Encounter: Payer: Self-pay | Admitting: Neurology

## 2016-06-16 ENCOUNTER — Ambulatory Visit (INDEPENDENT_AMBULATORY_CARE_PROVIDER_SITE_OTHER): Payer: 59 | Admitting: Neurology

## 2016-06-16 VITALS — BP 102/80 | HR 84 | Resp 20 | Ht 62.0 in | Wt 177.0 lb

## 2016-06-16 DIAGNOSIS — G4700 Insomnia, unspecified: Secondary | ICD-10-CM | POA: Diagnosis not present

## 2016-06-16 DIAGNOSIS — F5104 Psychophysiologic insomnia: Secondary | ICD-10-CM

## 2016-06-16 MED ORDER — ZOLPIDEM TARTRATE ER 12.5 MG PO TBCR: 12.5000 mg | EXTENDED_RELEASE_TABLET | Freq: Every evening | ORAL | 1 refills | Status: DC | PRN

## 2016-06-16 NOTE — Progress Notes (Signed)
[facility]   Provider:  Larey Seat, M D  Referring Provider: Burnis Medin, MD Primary Care Physician:  Lottie Dawson, MD  Chief Complaint  Patient presents with  . Follow-up    insomnia, on ambien    HPI:  Tiffany Carson is a 50 y.o. female , seen here as a revisit  from Dr. Regis Bill for  Insomnia, but presenting with a task specific dystonia.  Chief complaint according to patient : here today for Ambien refill. The patient reports that she is doing well on Ambien 12.5 in an extended release form, provided by Dr. Regis Bill.  The patient had undergone a sleep study which revealed no organic sleep disorder of significance. She is meanwhile 50 years old, she underwent a hysterectomy at age 66 and very likely went into menopause within the next 2 years. This was a time of exacerbation of insomnia.  She still needs about an hour to fall asleep after taking Ambien. Her bedtime is usually around 11:30 PM so she may fall asleep anytime between midnight and 1 AM. She does not have nocturia breaks, she usually sleeps through until 4 AM when her alarm rings. So she really doesn't gets a lot of sleep only about 4 hours with the help of medication each night. Her fatigue severity score was endorsed at 23 points the Epworth sleepiness score at 4 points both are below average not indicative of high fatigue or high sleepiness. She arrives at work at 6 AM. She takes rarely afternoon naps, if so- 30 minutes duration.  She was given Vyvanse for adult ADD, and has tolerated this well, no tics, no twitches. 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  She is aware that the continuous use of Ambien can lead to addiction. She had sleep eating spells on Ambien.   15th of August 2017, I have the pleasure of seeing Tiffany Carson today and her yearly revisit. The patient is taking Vyvanse for adult ADD as tolerated this well without any  even incremental increase and dystonic muscle activity there is no myoclonusand no tic. She is using Ambien at night, has not reported new sleep walking episodes, no sleep eating episodes. The medicine is still working for her and she feels that she has a better quality of life. She endorsed the Epworth score at 3 points and the fatigue severity score at 12 points. I will refill the medication today.  Social history: patient enrolled in [employer] school for next year.    Review of Systems: Out of a complete 14 system review, the patient complains of only the following symptoms, and all other reviewed systems are negative.   Epworth score  4 , Fatigue severity score 25  , depression score 0   Social History   Social History  . Marital status: Single    Spouse name: n/a  . Number of children: 0  . Years of education: 53   Occupational History  . NUCLEAR MEDICINE [employer]   Social History Main Topics  . Smoking status: Never Smoker  . Smokeless tobacco: Never Used  . Alcohol use 0.0 - 0.6 oz/week     Comment: rarely  . Drug use: No  . Sexual activity: No   Other Topics Concern  . Not on file   Social History Narrative   Lives alone, with 1 dog.   Cousins live nearby.  Siblings are in middle [place].   Neg td  ocass etoh 1 caffeine per day    40 hours  Per week.    Patient is right-handed.   Patient has a Haematologist.             Family History  Problem Relation Age of Onset  . Arthritis    . Hypertension    . Diabetes Mother   . Hypertension Mother   . Diabetes Brother 56    2013, type 2  . Colon cancer Neg Hx     Past Medical History:  Diagnosis Date  . ABDOMINAL PAIN RIGHT LOWER QUADRANT 10/03/2010  . Acquired absence of both cervix and uterus 10/03/2010  . ADHD (attention deficit hyperactivity disorder)    per dr Johnnye Sima  see notes  . ANEMIA, IRON DEFICIENCY 07/02/2010  . COUGH, CHRONIC 04/09/2010   pt  denies  . FATIGUE 06/24/2009  . FIBROIDS, UTERUS 09/03/2010  . GERD 08/08/2007  . HYPERLIPIDEMIA 08/08/2007  . Irregular menstrual cycle 06/24/2009  . LEG CRAMPS 06/24/2009  . LEG PAIN, RIGHT 10/03/2010  . OBESITY 06/24/2009  . SLEEPLESSNESS 11/16/2007  . TINGLING 10/03/2010  . TREMOR 07/02/2010  . VITAMIN D DEFICIENCY 06/02/2010    Past Surgical History:  Procedure Laterality Date  . ABDOMINAL HYSTERECTOMY  nov 2011   secondary infection  had adhesions  . BREAST REDUCTION SURGERY  09 20 12  . BREAST SURGERY    . CHOLECYSTECTOMY    . OOPHORECTOMY     left   salpingesctomy     Current Outpatient Prescriptions  Medication Sig Dispense Refill  . ibuprofen (ADVIL,MOTRIN) 200 MG tablet Take 200 mg by mouth every 6 (six) hours as needed.      . Vitamin D, Cholecalciferol, 400 UNITS CHEW Chew by mouth.     Marland Kitchen VYVANSE 20 MG capsule Take 20 mg by mouth daily at 3 pm.  0  . VYVANSE 40 MG capsule Take 40 mg by mouth every morning.  0  . zolpidem (AMBIEN CR) 12.5 MG CR tablet TAKE 1 TABLET BY MOUTH AT BEDTIME AS NEEDED 90 tablet 1   No current facility-administered medications for this visit.     Allergies as of 06/16/2016  . (No Known Allergies)    Vitals: BP 102/80   Pulse 84   Resp 20   Ht 5\' 2"  (1.575 m)   Wt 177 lb (80.3 kg)   BMI 32.37 kg/m  Last Weight:  Wt Readings from Last 1 Encounters:  06/16/16 177 lb (80.3 kg)   [id number] mass index is 32.37 kg/m.     Last Height:   Ht Readings from Last 1 Encounters:  06/16/16 5\' 2"  (1.575 m)    Physical exam:  General: The patient is awake, alert and appears not in acute distress. The patient is well groomed. Head: Normocephalic, atraumatic. Neck is supple. Mallampati 3,  neck circumference: 15. Nasal airflow seasonally restricted , TMJ is not  evident . Retrognathia is not seen.  Cardiovascular:  Regular rate and rhythm , without  murmurs or carotid bruit, and without distended neck veins. Respiratory: Lungs are clear to  auscultation. Skin:  Without evidence of edema, or rash Trunk: BMI is elevated . The patient's posture is erect.  Neurologic exam : The patient is awake and alert, oriented to place and time.    Attention span & concentration ability appears normal.  Speech is fluent,  without  dysarthria, dysphonia or aphasia. Mood and affect are appropriate.  Cranial nerves: Pupils are equal and briskly reactive to light.  Extraocular movements  in vertical and horizontal planes intact and without nystagmus.  Visual fields by finger perimetry are intact. Hearing to finger rub intact.   Facial sensation intact to fine touch.  Facial motor strength is symmetric and tongue and uvula move midline. Shoulder shrug was symmetrical.    The patient was advised of the nature of the diagnosed  disorder , the treatment options and risks for general a health and wellness arising from not treating the condition.  I spent more than 25  minutes of face to face time with the patient. Greater than 50% of time was spent in counseling and coordination of care. We have discussed the diagnosis and differential and I answered the patient's questions.     Assessment:  After physical and neurologic examination, review of laboratory studies,  Personal review of imaging studies, reports of other /same  Imaging studies ,  Results of polysomnography/ neurophysiology testing and pre-existing records as far as provided in visit., my assessment is   1) chronic insomnia- uses Ambien 12.5 mg prn.   2) Botox treatments with Dr Krista Blue  ( neuromuscular specialist ) for dystonia. Botox was denied, can another similar drug be used?   3) obesity - discussed low carb diet.       Rv once a year for Ambien.    Asencion Partridge Vlasta Baskin MD  06/16/2016   CC: Burnis Medin, [place] [address] [place], [place] [address]

## 2016-06-24 ENCOUNTER — Encounter: Payer: Self-pay | Admitting: Vascular Surgery

## 2016-06-29 ENCOUNTER — Encounter: Payer: Self-pay | Admitting: Vascular Surgery

## 2016-06-29 ENCOUNTER — Other Ambulatory Visit: Payer: Self-pay | Admitting: *Deleted

## 2016-06-29 ENCOUNTER — Ambulatory Visit (HOSPITAL_COMMUNITY)
Admission: RE | Admit: 2016-06-29 | Discharge: 2016-06-29 | Disposition: A | Discharge status: Home / Self Care | Payer: 59 | Source: Ambulatory Visit | Attending: Vascular Surgery | Admitting: Vascular Surgery

## 2016-06-29 ENCOUNTER — Ambulatory Visit (INDEPENDENT_AMBULATORY_CARE_PROVIDER_SITE_OTHER): Payer: 59 | Admitting: Vascular Surgery

## 2016-06-29 VITALS — BP 133/87 | HR 69 | Temp 97.8°F | Resp 16 | Ht 62.0 in | Wt 176.0 lb

## 2016-06-29 DIAGNOSIS — I8392 Asymptomatic varicose veins of left lower extremity: Secondary | ICD-10-CM | POA: Insufficient documentation

## 2016-06-29 DIAGNOSIS — I83893 Varicose veins of bilateral lower extremities with other complications: Secondary | ICD-10-CM

## 2016-06-29 DIAGNOSIS — R609 Edema, unspecified: Secondary | ICD-10-CM | POA: Diagnosis present

## 2016-06-29 NOTE — Progress Notes (Signed)
Subjective:     Patient ID: Tiffany Carson, female   DOB: Jan 19, 1966, 50 y.o.   MRN: [id number]  HPI This 50 year old female patient returns 3 months post-evaluation for painful varicosities in both lower extremities right worse than left. She has tried long leg elastic compression stockings 20-30 millimeter gradient as well as elevation and ibuprofen with no improvement in her symptoms. She does have aching throbbing and burning discomfort in both legs right worse than left as the day progresses. She also developed some swelling in the ankles right worse than left. She does have bulging varicosities in both calf areas and her symptoms are affecting her daily living.  Past Medical History:  Diagnosis Date  . ABDOMINAL PAIN RIGHT LOWER QUADRANT 10/03/2010  . Acquired absence of both cervix and uterus 10/03/2010  . ADHD (attention deficit hyperactivity disorder)    per dr Johnnye Sima  see notes  . ANEMIA, IRON DEFICIENCY 07/02/2010  . COUGH, CHRONIC 04/09/2010   pt denies  . FATIGUE 06/24/2009  . FIBROIDS, UTERUS 09/03/2010  . GERD 08/08/2007  . HYPERLIPIDEMIA 08/08/2007  . Irregular menstrual cycle 06/24/2009  . LEG CRAMPS 06/24/2009  . LEG PAIN, RIGHT 10/03/2010  . OBESITY 06/24/2009  . SLEEPLESSNESS 11/16/2007  . TINGLING 10/03/2010  . TREMOR 07/02/2010  . VITAMIN D DEFICIENCY 06/02/2010    Social History  Substance Use Topics  . Smoking status: Never Smoker  . Smokeless tobacco: Never Used  . Alcohol use 0.0 - 0.6 oz/week     Comment: rarely    Family History  Problem Relation Age of Onset  . Arthritis    . Hypertension    . Diabetes Mother   . Hypertension Mother   . Diabetes Brother 59    2013, type 2  . Colon cancer Neg Hx     No Known Allergies   Current Outpatient Prescriptions:  .  ibuprofen (ADVIL,MOTRIN) 200 MG tablet, Take 200 mg by mouth every 6 (six) hours as needed.  , Disp: , Rfl:  .  Vitamin D, Cholecalciferol, 400 UNITS CHEW, Chew by mouth. , Disp: , Rfl:  .   VYVANSE 20 MG capsule, Take 20 mg by mouth daily at 3 pm., Disp: , Rfl: 0 .  VYVANSE 40 MG capsule, Take 40 mg by mouth every morning., Disp: , Rfl: 0 .  zolpidem (AMBIEN CR) 12.5 MG CR tablet, Take 1 tablet (12.5 mg total) by mouth at bedtime as needed., Disp: 90 tablet, Rfl: 1  Vitals:   06/29/16 1405  BP: 133/87  Pulse: 69  Resp: 16  Temp: 97.8 F (36.6 C)  SpO2: 98%  Weight: 176 lb (79.8 kg)  Height: 5\' 2"  (1.575 m)    Body mass index is 32.19 kg/m.         Review of Systems Denies chest pain, dyspnea on exertion, PND, orthopnea, hemoptysis, claudication    Objective:   Physical Exam BP 133/87   Pulse 69   Temp 97.8 F (36.6 C)   Resp 16   Ht 5\' 2"  (1.575 m)   Wt 176 lb (79.8 kg)   SpO2 98%   BMI 32.19 kg/m   Gen. well-developed well-nourished female no apparent distress alert and oriented 3 Lungs no rhonchi or wheezing Right leg with bulging varicosities in the medial thigh and medial calf with 1+ distal edema. No active ulcer noted. Left leg with bulging varicosities in the medial calf and spider veins in the medial and lateral thigh with minimal distal edema. No hyperpigmentation  or ulceration noted.  Patient has documented gross reflux in the right great saphenous vein supplying these painful varicosities Today I performed a bedside sono site exam of the left leg and the great saphenous vein has enlarged since last visit 3 months ago and does have gross reflux on my exam today  Today I repeated a limited exam of the left leg for confirmation The new formal duplex exam of the left leg confirms that there is gross reflux throughout the left great saphenous vein in the thigh and it is a large caliber vein supplying the painful varicosities     Assessment:     Bilateral painful varicosities with pain and swelling causing symptoms which are resistant to conservative measures including long-leg elastic compression stockings 20-30 millimeter gradient,  elevation, and ibuprofen Symptoms are affecting patient's daily living in her work which requires her to stand    Plan:     Patient needs #1 laser ablation right great saphenous vein plus greater than 20 stab phlebectomy of painful varicosities followed by #2 laser ablation left great saphenous vein with 10-20 stab phlebectomy of painful varicosities followed by 1 course of sclerotherapy We will proceed with precertification to perform this in the near future

## 2016-06-30 ENCOUNTER — Ambulatory Visit: Payer: 59 | Admitting: Vascular Surgery

## 2016-07-01 ENCOUNTER — Other Ambulatory Visit: Payer: Self-pay | Admitting: *Deleted

## 2016-07-01 DIAGNOSIS — I83893 Varicose veins of bilateral lower extremities with other complications: Secondary | ICD-10-CM

## 2016-07-16 ENCOUNTER — Encounter: Payer: Self-pay | Admitting: Physician Assistant

## 2016-07-21 ENCOUNTER — Encounter: Payer: Self-pay | Admitting: Vascular Surgery

## 2016-07-27 ENCOUNTER — Encounter: Payer: Self-pay | Admitting: Vascular Surgery

## 2016-07-27 ENCOUNTER — Ambulatory Visit (INDEPENDENT_AMBULATORY_CARE_PROVIDER_SITE_OTHER): Payer: 59 | Admitting: Vascular Surgery

## 2016-07-27 VITALS — BP 125/82 | HR 90 | Temp 98.0°F | Resp 16 | Ht 62.0 in | Wt 175.0 lb

## 2016-07-27 DIAGNOSIS — I83891 Varicose veins of right lower extremities with other complications: Secondary | ICD-10-CM

## 2016-07-27 NOTE — Progress Notes (Signed)
Laser Ablation Procedure    Date: 07/27/2016   Tiffany Carson DOB:1965-11-28  Consent signed: Yes    Surgeon:  Dr. Nelda Severe. Kellie Simmering  Procedure: Laser Ablation: right Greater Saphenous Vein  BP 125/82   Pulse 90   Temp 98 F (36.7 C)   Resp 16   Ht 5\' 2"  (1.575 m)   Wt 175 lb (79.4 kg)   SpO2 100%   BMI 32.01 kg/m   Tumescent Anesthesia: 460 cc 0.9% NaCl with 50 cc Lidocaine HCL with 1% Epi and 15 cc 8.4% NaHCO3  Local Anesthesia: 8 cc Lidocaine HCL and NaHCO3 (ratio 2:1)  Pulsed Mode: 15 watts, 565ms delay, 1.0 duration  Total Energy:1929              Total Pulses:129               Total Time: 2:08    Stab Phlebectomy: >20 Sites: Thigh and Calf  Patient tolerated procedure well  Notes:   Description of Procedure:  After marking the course of the secondary varicosities, the patient was placed on the operating table in the supine position, and the right leg was prepped and draped in sterile fashion.   Local anesthetic was administered and under ultrasound guidance the saphenous vein was accessed with a micro needle and guide wire; then the mirco puncture sheath was placed.  A guide wire was inserted saphenofemoral junction , followed by a 5 french sheath.  The position of the sheath and then the laser fiber below the junction was confirmed using the ultrasound.  Tumescent anesthesia was administered along the course of the saphenous vein using ultrasound guidance. The patient was placed in Trendelenburg position and protective laser glasses were placed on patient and staff, and the laser was fired at 15 watts continuous mode advancing 1-36mm/second for a total of 1929 joules.   For stab phlebectomies, local anesthetic was administered at the previously marked varicosities, and tumescent anesthesia was administered around the vessels.  Greater than 20 stab wounds were made using the tip of an 11 blade. And using the vein hook, the phlebectomies were performed using a hemostat to  avulse the varicosities.  Adequate hemostasis was achieved.     Steri strips were applied to the stab wounds and ABD pads and thigh high compression stockings were applied.  Ace wrap bandages were applied over the phlebectomy sites and at the top of the saphenofemoral junction. Blood loss was less than 15 cc.  The patient ambulated out of the operating room having tolerated the procedure well.

## 2016-07-27 NOTE — Progress Notes (Signed)
Subjective:     Patient ID: Blenda Bridegroom, female   DOB: 06-15-1966, 50 y.o.   MRN: [id number]  HPI This 50 year old female had laser ablation right great saphenous vein from the knee to near the saphenofemoral junction plus greater than 20 stab phlebectomy of painful varicosities performed under local tumescent anesthesia. A total of 1929 J of energy was utilized. She tolerated the procedure well.  Review of Systems     Objective:   Physical Exam BP 125/82   Pulse 90   Temp 98 F (36.7 C)   Resp 16   Ht 5\' 2"  (1.575 m)   Wt 175 lb (79.4 kg)   SpO2 100%   BMI 32.01 kg/m        Assessment:     Well-tolerated laser ablation right great saphenous vein plus greater than 20 stab phlebectomy of painful varicosities performed under local tumescent anesthesia    Plan:     Return in 2 weeks for venous duplex exam to confirm closure right great saphenous vein and patient will have similar procedure and contralateral left leg in the near future

## 2016-07-28 ENCOUNTER — Encounter: Payer: Self-pay | Admitting: Vascular Surgery

## 2016-07-28 ENCOUNTER — Telehealth: Payer: Self-pay | Admitting: *Deleted

## 2016-07-28 NOTE — Telephone Encounter (Signed)
Left a message on her voicemail asking her to call if she has any questions or concerns.

## 2016-08-04 ENCOUNTER — Encounter: Payer: Self-pay | Admitting: Vascular Surgery

## 2016-08-10 ENCOUNTER — Encounter: Payer: Self-pay | Admitting: Vascular Surgery

## 2016-08-10 ENCOUNTER — Ambulatory Visit (HOSPITAL_COMMUNITY)
Admission: RE | Admit: 2016-08-10 | Discharge: 2016-08-10 | Disposition: A | Discharge status: Home / Self Care | Payer: 59 | Source: Ambulatory Visit | Attending: Vascular Surgery | Admitting: Vascular Surgery

## 2016-08-10 ENCOUNTER — Ambulatory Visit (INDEPENDENT_AMBULATORY_CARE_PROVIDER_SITE_OTHER): Payer: 59 | Admitting: Vascular Surgery

## 2016-08-10 VITALS — BP 119/80 | HR 90 | Temp 98.8°F | Resp 18 | Ht 62.0 in | Wt 180.3 lb

## 2016-08-10 DIAGNOSIS — I83893 Varicose veins of bilateral lower extremities with other complications: Secondary | ICD-10-CM

## 2016-08-10 NOTE — Progress Notes (Signed)
Subjective:     Patient ID: Tiffany Carson, female   DOB: [birth year], 50 y.o.   MRN: [id number]  HPI This 50 year old female returns 1 week post-laser ablation right great saphenous vein plus multiple stab phlebectomy of painful varicosities. She has no complaints today. She's had no distal edema chest pain shortness of breath or hemoptysis. She has worn her elastic compression stocking and taken ibuprofen as instructed. The discomfort in the medial thigh is subsiding.  Review of Systems     Objective:   Physical Exam BP 119/80 (BP Location: Left Arm, Patient Position: Sitting, Cuff Size: Normal)   Pulse 90   Temp 98.8 F (37.1 C) (Oral)   Resp 18   Ht 5\' 2"  (1.575 m)   Wt 180 lb 4.8 oz (81.8 kg)   SpO2 97%   BMI 32.98 kg/m   Gen. well-developed well-nourished female no apparent distress alert and oriented 3 Lungs no rhonchi or wheezing Right leg with mild discomfort to palpation of the great saphenous vein from the knee to the proximal thigh. Stab phlebectomy sites all healing nicely with no evidence of infection. No distal edema noted. 3+ dorsalis pedis pulse palpable.  Today I ordered a venous duplex exam of the right leg which I reviewed and interpreted. There is no DVT. There is total closure of the right great saphenous vein up to near the saphenofemoral junction     Assessment:     Successful laser ablation right great saphenous vein with multiple stab phlebectomy of painful varicosities    Plan:     Patient to return next week for similar procedure and contralateral left leg

## 2016-08-13 ENCOUNTER — Encounter: Payer: Self-pay | Admitting: Vascular Surgery

## 2016-08-17 ENCOUNTER — Ambulatory Visit (INDEPENDENT_AMBULATORY_CARE_PROVIDER_SITE_OTHER): Payer: 59 | Admitting: Vascular Surgery

## 2016-08-17 ENCOUNTER — Encounter: Payer: Self-pay | Admitting: Vascular Surgery

## 2016-08-17 VITALS — BP 135/86 | HR 106 | Temp 97.4°F | Resp 16 | Ht 62.0 in | Wt 175.0 lb

## 2016-08-17 DIAGNOSIS — I83892 Varicose veins of left lower extremities with other complications: Secondary | ICD-10-CM

## 2016-08-17 DIAGNOSIS — I868 Varicose veins of other specified sites: Secondary | ICD-10-CM

## 2016-08-17 NOTE — Progress Notes (Signed)
Laser Ablation Procedure    Date: 08/17/2016   Tiffany Carson DOB:[birth year]  Consent signed: Yes    Surgeon:  Dr. Nelda Severe. Kellie Simmering  Procedure: Laser Ablation: left Greater Saphenous Vein  BP 135/86   Pulse (!) 106   Temp 97.4 F (36.3 C)   Resp 16   Ht 5\' 2"  (1.575 m)   Wt 175 lb (79.4 kg)   SpO2 98%   BMI 32.01 kg/m   Tumescent Anesthesia: 430 cc 0.9% NaCl with 50 cc Lidocaine HCL with 1% Epi and 15 cc 8.4% NaHCO3  Local Anesthesia: 6 cc Lidocaine HCL and NaHCO3 (ratio 2:1)  Pulsed Mode: 15 watts, 515ms delay, 1.0 duration  Total Energy: 1452             Total Pulses:   97             Total Time: 1:37    Stab Phlebectomy: 10-20 Sites: Calf  Patient tolerated procedure well  Notes:   Description of Procedure:  After marking the course of the secondary varicosities, the patient was placed on the operating table in the supine position, and the left leg was prepped and draped in sterile fashion.   Local anesthetic was administered and under ultrasound guidance the saphenous vein was accessed with a micro needle and guide wire; then the mirco puncture sheath was placed.  A guide wire was inserted saphenofemoral junction , followed by a 5 french sheath.  The position of the sheath and then the laser fiber below the junction was confirmed using the ultrasound.  Tumescent anesthesia was administered along the course of the saphenous vein using ultrasound guidance. The patient was placed in Trendelenburg position and protective laser glasses were placed on patient and staff, and the laser was fired at 15 watts continuous mode advancing 1-56mm/second for a total of 1452 joules.   For stab phlebectomies, local anesthetic was administered at the previously marked varicosities, and tumescent anesthesia was administered around the vessels.  Ten to 20 stab wounds were made using the tip of an 11 blade. And using the vein hook, the phlebectomies were performed using a hemostat to avulse the  varicosities.  Adequate hemostasis was achieved.     Steri strips were applied to the stab wounds and ABD pads and thigh high compression stockings were applied.  Ace wrap bandages were applied over the phlebectomy sites and at the top of the saphenofemoral junction. Blood loss was less than 15 cc.  The patient ambulated out of the operating room having tolerated the procedure well.

## 2016-08-17 NOTE — Progress Notes (Signed)
Subjective:     Patient ID: Tiffany Carson, female   DOB: 11-29-1965, 50 y.o.   MRN: [id number]  HPI This 50 year old female had laser ablation left great saphenous findings from the distal thigh to near the saphenofemoral junction plus multiple stab phlebectomy of painful varicosities of the left calf-10-20. She tolerated the procedures well. A total of 1452 J of energy was utilized.  Review of Systems     Objective:   Physical Exam BP 135/86   Pulse (!) 106   Temp 97.4 F (36.3 C)   Resp 16   Ht 5\' 2"  (1.575 m)   Wt 175 lb (79.4 kg)   SpO2 98%   BMI 32.01 kg/m       Assessment:     Well-tolerated laser ablation left great saphenous vein plus multiple stab phlebectomy of painful varicosities performed under local tumescent anesthesia    Plan:     Return in 1 week for venous duplex exam to confirm closure left great saphenous vein

## 2016-08-18 ENCOUNTER — Telehealth: Payer: Self-pay | Admitting: *Deleted

## 2016-08-18 NOTE — Telephone Encounter (Signed)
Asked pt to call me if she has any questions or concerns since her proced yesterday.

## 2016-08-20 ENCOUNTER — Encounter: Payer: Self-pay | Admitting: Vascular Surgery

## 2016-08-24 ENCOUNTER — Ambulatory Visit (HOSPITAL_COMMUNITY)
Admission: RE | Admit: 2016-08-24 | Discharge: 2016-08-24 | Disposition: A | Discharge status: Home / Self Care | Payer: 59 | Source: Ambulatory Visit | Attending: Vascular Surgery | Admitting: Vascular Surgery

## 2016-08-24 ENCOUNTER — Ambulatory Visit (INDEPENDENT_AMBULATORY_CARE_PROVIDER_SITE_OTHER): Payer: Self-pay | Admitting: Vascular Surgery

## 2016-08-24 ENCOUNTER — Encounter: Payer: Self-pay | Admitting: Vascular Surgery

## 2016-08-24 VITALS — BP 112/79 | HR 92 | Temp 98.7°F | Resp 16

## 2016-08-24 DIAGNOSIS — H5201 Hypermetropia, right eye: Secondary | ICD-10-CM | POA: Diagnosis not present

## 2016-08-24 DIAGNOSIS — D3131 Benign neoplasm of right choroid: Secondary | ICD-10-CM | POA: Diagnosis not present

## 2016-08-24 DIAGNOSIS — H524 Presbyopia: Secondary | ICD-10-CM | POA: Diagnosis not present

## 2016-08-24 DIAGNOSIS — I83893 Varicose veins of bilateral lower extremities with other complications: Secondary | ICD-10-CM | POA: Diagnosis not present

## 2016-08-24 DIAGNOSIS — H43393 Other vitreous opacities, bilateral: Secondary | ICD-10-CM | POA: Diagnosis not present

## 2016-08-24 DIAGNOSIS — Z9889 Other specified postprocedural states: Secondary | ICD-10-CM | POA: Diagnosis not present

## 2016-08-24 DIAGNOSIS — I83892 Varicose veins of left lower extremities with other complications: Secondary | ICD-10-CM

## 2016-08-24 DIAGNOSIS — H52221 Regular astigmatism, right eye: Secondary | ICD-10-CM | POA: Diagnosis not present

## 2016-08-24 DIAGNOSIS — H5202 Hypermetropia, left eye: Secondary | ICD-10-CM | POA: Diagnosis not present

## 2016-08-24 DIAGNOSIS — H04123 Dry eye syndrome of bilateral lacrimal glands: Secondary | ICD-10-CM | POA: Diagnosis not present

## 2016-08-24 NOTE — Progress Notes (Signed)
Subjective:     Patient ID: Tiffany Carson, female   DOB: April 03, 1966, 50 y.o.   MRN: [id number]  HPI  This 50 year old female returns 1 week post-laser ablation left great saphenous vein plus multiple stab phlebectomy of painful varicosities in the left leg. She states she has had minimal discomfort. She has worn her elastic compression stocking as instructed and taken ibuprofen. She's had no distal edema. She denies chest pain dyspnea on exertion or hemoptysis or other symptoms.  Review of Systems     Objective:   Physical Exam BP 112/79 (BP Location: Left Arm, Patient Position: Sitting, Cuff Size: Normal)   Pulse 92   Temp 98.7 F (37.1 C)   Resp 16   SpO2 98%   Gen. well-developed well-nourished female no apparent distress alert and oriented 3 Lungs no rhonchi or wheezing Left leg with multiple stab phlebectomy's in the distal thigh and calf which are healing nicely Steri-Strips in place No distal edema 3+ dorsalis pedis pulse palpable Mild ecchymosis along course of great saphenous vein in mid to proximal thigh with mild tenderness Today I ordered a venous duplex exam the left leg which I reviewed and interpreted. There is no DVT. There is total closure of the left great saphenous vein from the proximal calf to near the saphenofemoral junction     Assessment:     Successful left great saphenous vein laser ablation plus multiple stab phlebectomy for painful varicosities-healing nicely No evidence of DVT    Plan:     Patient will return in the next few weeks for 1 course of sclerotherapy to complete her treatment regimen

## 2016-09-23 ENCOUNTER — Encounter: Payer: Self-pay | Admitting: *Deleted

## 2016-09-30 ENCOUNTER — Ambulatory Visit (INDEPENDENT_AMBULATORY_CARE_PROVIDER_SITE_OTHER): Payer: 59 | Admitting: *Deleted

## 2016-09-30 DIAGNOSIS — I83893 Varicose veins of bilateral lower extremities with other complications: Secondary | ICD-10-CM | POA: Diagnosis not present

## 2016-09-30 NOTE — Progress Notes (Signed)
X=.3% Sotradecol administered with a 27g butterfly.  Patient received a total of 12cc.  Treated combo of retics and spiders. Easy acces tho she was jumpy. Tol well. Anticipate good results. Follow prn.    Compression stockings applied: Yes.

## 2016-10-05 DIAGNOSIS — Z79899 Other long term (current) drug therapy: Secondary | ICD-10-CM | POA: Diagnosis not present

## 2016-10-05 DIAGNOSIS — F902 Attention-deficit hyperactivity disorder, combined type: Secondary | ICD-10-CM | POA: Diagnosis not present

## 2016-10-05 DIAGNOSIS — F4010 Social phobia, unspecified: Secondary | ICD-10-CM | POA: Diagnosis not present

## 2016-12-07 ENCOUNTER — Encounter: Payer: Self-pay | Admitting: Nurse Practitioner

## 2016-12-07 ENCOUNTER — Ambulatory Visit (INDEPENDENT_AMBULATORY_CARE_PROVIDER_SITE_OTHER): Payer: 59 | Admitting: Nurse Practitioner

## 2016-12-07 VITALS — BP 117/73 | HR 85 | Ht 62.0 in | Wt 189.4 lb

## 2016-12-07 DIAGNOSIS — F5104 Psychophysiologic insomnia: Secondary | ICD-10-CM | POA: Diagnosis not present

## 2016-12-07 DIAGNOSIS — G4700 Insomnia, unspecified: Secondary | ICD-10-CM | POA: Diagnosis not present

## 2016-12-07 MED ORDER — ZOLPIDEM TARTRATE ER 12.5 MG PO TBCR: 12.5000 mg | EXTENDED_RELEASE_TABLET | Freq: Every evening | ORAL | 1 refills | Status: DC | PRN

## 2016-12-07 NOTE — Progress Notes (Signed)
[facility]  PATIENT: Tiffany Carson DOB: 04-28-1966   REASON FOR VISIT: insomnia HISTORY FROM: Patient    HISTORY OF PRESENT ILLNESS: Ms. Tiffany Carson, 51 year old female returns for follow-up with history of insomnia and task symmetric dystonia. Sleep study in the past has not refilled organic sleep disorder of significance. She is currently on Ambien and gets about 4-5 hours of sleep nightly. Her ESS score is 2. She rarely naps. She denies any sleepwalking or episodes of sleep eating. She currently works in Management consultant medicine but is wanting to get into school in [place] which is her home. She needs refills on her Ambien. She is on Vyvanse for attention deficit disorder .She returns for reevaluation   REVIEW OF SYSTEMS: Full 14 system review of systems performed and notable only for those listed, all others are neg:  Constitutional: neg  Cardiovascular: neg Ear/Nose/Throat: neg  Skin: neg Eyes: neg Respiratory: neg Gastroitestinal: neg  Hematology/Lymphatic: neg  Endocrine: neg Musculoskeletal:neg Allergy/Immunology: neg Neurological: neg Psychiatric: neg Sleep : Insomnia   ALLERGIES: No Known Allergies  HOME MEDICATIONS: Outpatient Medications Prior to Visit  Medication Sig Dispense Refill  . ibuprofen (ADVIL,MOTRIN) 200 MG tablet Take 200 mg by mouth every 6 (six) hours as needed.      . Vitamin D, Cholecalciferol, 400 UNITS CHEW Chew by mouth.     Marland Kitchen VYVANSE 20 MG capsule Take 20 mg by mouth daily at 3 pm.  0  . VYVANSE 40 MG capsule Take 40 mg by mouth every morning.  0  . zolpidem (AMBIEN CR) 12.5 MG CR tablet Take 1 tablet (12.5 mg total) by mouth at bedtime as needed. 90 tablet 1   No facility-administered medications prior to visit.     PAST MEDICAL HISTORY: Past Medical History:  Diagnosis Date  . ABDOMINAL PAIN RIGHT LOWER QUADRANT 10/03/2010  . Acquired absence of both cervix and uterus 10/03/2010  . ADHD (attention deficit  hyperactivity disorder)    per dr Johnnye Sima  see notes  . ANEMIA, IRON DEFICIENCY 07/02/2010  . COUGH, CHRONIC 04/09/2010   pt denies  . FATIGUE 06/24/2009  . FIBROIDS, UTERUS 09/03/2010  . GERD 08/08/2007  . HYPERLIPIDEMIA 08/08/2007  . Irregular menstrual cycle 06/24/2009  . LEG CRAMPS 06/24/2009  . LEG PAIN, RIGHT 10/03/2010  . OBESITY 06/24/2009  . SLEEPLESSNESS 11/16/2007  . TINGLING 10/03/2010  . TREMOR 07/02/2010  . VITAMIN D DEFICIENCY 06/02/2010    PAST SURGICAL HISTORY: Past Surgical History:  Procedure Laterality Date  . ABDOMINAL HYSTERECTOMY  nov 2011   secondary infection  had adhesions  . BREAST REDUCTION SURGERY  09 20 12  . BREAST SURGERY    . CHOLECYSTECTOMY    . OOPHORECTOMY     left   salpingesctomy     FAMILY HISTORY: Family History  Problem Relation Age of Onset  . Diabetes Mother   . Hypertension Mother   . Diabetes Brother 56    2013, type 2  . Arthritis    . Hypertension    . Colon cancer Neg Hx     SOCIAL HISTORY: Social History   Social History  . Marital status: Single    Spouse name: n/a  . Number of children: 0  . Years of education: 23   Occupational History  . NUCLEAR MEDICINE [employer]   Social History Main Topics  . Smoking status: Never Smoker  . Smokeless tobacco: Never Used  . Alcohol use  0.0 - 0.6 oz/week     Comment: rarely  . Drug use: No  . Sexual activity: No   Other Topics Concern  . Not on file   Social History Narrative   Lives alone, with 1 dog.   Cousins live nearby.  Siblings are in middle [place].   Neg td  ocass etoh 1 caffeine per day    40 hours  Per week.    Patient is right-handed.   Patient has a Haematologist.              PHYSICAL EXAM  Vitals:   12/07/16 1241  Weight: 189 lb 6.4 oz (85.9 kg)  Height: 5\' 2"  (1.575 m)   Body mass index is 34.64 kg/m.  Generalized: Well developed, Obese female in no acute distress  Head:  normocephalic and atraumatic,. Oropharynx benign  Neck: Supple, no carotid bruits  Cardiac: Regular rate rhythm, no murmur  Musculoskeletal: No deformity   Neurological examination   Mentation: Alert oriented to time, place, history taking. Attention span and concentration appropriate. Recent and remote memory intact.  Follows all commands speech and language fluent. ESS 2.  Cranial nerve II-XII: Pupils were equal round reactive to light extraocular movements were full, visual field were full on confrontational test. Facial sensation and strength were normal. hearing was intact to finger rubbing bilaterally. Uvula tongue midline. head turning and shoulder shrug were normal and symmetric.Tongue protrusion into cheek strength was normal. Motor: normal bulk and tone, full strength in the BUE, BLE, fine finger movements normal, no pronator drift. No focal weakness Sensory: normal and symmetric to light touch, pinprick, and  Vibration, in the upper and lower extremities Coordination: finger-nose-finger, heel-to-shin bilaterally, no dysmetria Reflexes: Symmetric upper and lower, plantar responses were flexor bilaterally. Gait and Station: Rising up from seated position without assistance, normal stance,  moderate stride, good arm swing, smooth turning, able to perform tiptoe, and heel walking without difficulty. Tandem gait is steady  DIAGNOSTIC DATA (LABS, IMAGING, TESTING) - I reviewed patient records, labs, notes, testing and imaging myself where available.  Lab Results  Component Value Date   WBC 9.9 05/04/2016   HGB 15.5 05/04/2016   HCT 45.6 (H) 05/04/2016   MCV 88.2 05/04/2016   PLT 416 (H) 05/04/2016      Component Value Date/Time   NA 140 02/11/2016 1546   K 4.3 02/11/2016 1546   CL 104 02/11/2016 1546   CO2 27 02/11/2016 1546   GLUCOSE 84 02/11/2016 1546   BUN 15 02/11/2016 1546   CREATININE 0.65 02/11/2016 1546   CALCIUM 9.6 02/11/2016 1546   PROT 7.0 02/11/2016 1546    ALBUMIN 4.5 02/11/2016 1546   AST 18 02/11/2016 1546   ALT 22 02/11/2016 1546   ALKPHOS 78 02/11/2016 1546   BILITOT 0.4 02/11/2016 1546   GFRNONAA 100.56 10/03/2010 0000   Lab Results  Component Value Date   CHOL 232 (H) 02/11/2016   HDL 59.00 02/11/2016   LDLCALC 154 (H) 02/11/2016   LDLDIRECT 135.4 06/27/2012   TRIG 95.0 02/11/2016   CHOLHDL 4 02/11/2016   Lab Results  Component Value Date   HGBA1C 5.7 06/27/2012   Lab Results  Component Value Date   [id number] 05/04/2016   Lab Results  Component Value Date   TSH 1.47 02/11/2016      ASSESSMENT AND PLAN  51 y.o. year old female  has a past medical history of ; ADHD (attention deficit hyperactivity disorder); ANEMIA, IRON DEFICIENCY (07/02/2010); and  insomnia here to follow-up.    PLAN: Continue Ambien at current dose will refill Dystonia of right hand is cleared and no longer a problem Follow up yearly and prn for ambien refills I spent 15 minutes in total face to face time with the patient more than 50% of which was spent counseling and coordination of care, reviewing test results reviewing medications and discussing and reviewing the diagnosis of insomnia and answering the patient's questions. She was advised that chronic use of sleep agents can lead to psychological addiction and Ambien can also cause sleep eating spells and sleep walking spells which can be dangerous Dennie Bible, [facility], [facility], APRN  [facility] [address] [place], [place] [address] [phone]

## 2016-12-07 NOTE — Progress Notes (Signed)
I agree with the assessment and plan as directed by NP .The patient is known to me .   Syrena Burges, MD  

## 2016-12-07 NOTE — Progress Notes (Signed)
Fax confirmation received [facility] [phone] sy Lorrin Mais

## 2016-12-07 NOTE — Patient Instructions (Signed)
Continue Ambien at current dose will refill Dystonia of right hand is cleared Follow up yearly and prn

## 2016-12-21 ENCOUNTER — Ambulatory Visit: Payer: 59 | Admitting: Nurse Practitioner

## 2017-03-19 DIAGNOSIS — Z79899 Other long term (current) drug therapy: Secondary | ICD-10-CM | POA: Diagnosis not present

## 2017-03-19 DIAGNOSIS — F902 Attention-deficit hyperactivity disorder, combined type: Secondary | ICD-10-CM | POA: Diagnosis not present

## 2017-06-04 ENCOUNTER — Other Ambulatory Visit: Payer: Self-pay | Admitting: Nurse Practitioner

## 2017-06-04 DIAGNOSIS — F5104 Psychophysiologic insomnia: Secondary | ICD-10-CM

## 2017-06-04 NOTE — Telephone Encounter (Addendum)
LVM informing patient Ambien has been refilled, but she needs to call and schedule a follow up. Left number and advised her the phone staff can schedule her follow up with Daun Peacock, NP.  Ambien refill Rx successfully faxed to [facility].

## 2017-06-28 DIAGNOSIS — F902 Attention-deficit hyperactivity disorder, combined type: Secondary | ICD-10-CM | POA: Diagnosis not present

## 2017-06-28 DIAGNOSIS — Z79899 Other long term (current) drug therapy: Secondary | ICD-10-CM | POA: Diagnosis not present

## 2017-06-28 DIAGNOSIS — F1920 Other psychoactive substance dependence, uncomplicated: Secondary | ICD-10-CM | POA: Diagnosis not present

## 2017-07-23 ENCOUNTER — Encounter: Payer: Self-pay | Admitting: Internal Medicine

## 2017-10-04 DIAGNOSIS — F902 Attention-deficit hyperactivity disorder, combined type: Secondary | ICD-10-CM | POA: Diagnosis not present

## 2017-10-04 DIAGNOSIS — H52221 Regular astigmatism, right eye: Secondary | ICD-10-CM | POA: Diagnosis not present

## 2017-10-04 DIAGNOSIS — H43393 Other vitreous opacities, bilateral: Secondary | ICD-10-CM | POA: Diagnosis not present

## 2017-10-04 DIAGNOSIS — H2513 Age-related nuclear cataract, bilateral: Secondary | ICD-10-CM | POA: Diagnosis not present

## 2017-10-04 DIAGNOSIS — H524 Presbyopia: Secondary | ICD-10-CM | POA: Diagnosis not present

## 2017-10-04 DIAGNOSIS — Z79899 Other long term (current) drug therapy: Secondary | ICD-10-CM | POA: Diagnosis not present

## 2017-10-04 DIAGNOSIS — H5201 Hypermetropia, right eye: Secondary | ICD-10-CM | POA: Diagnosis not present

## 2017-10-04 DIAGNOSIS — D3131 Benign neoplasm of right choroid: Secondary | ICD-10-CM | POA: Diagnosis not present

## 2017-10-04 DIAGNOSIS — H04123 Dry eye syndrome of bilateral lacrimal glands: Secondary | ICD-10-CM | POA: Diagnosis not present

## 2017-10-04 DIAGNOSIS — H3530 Unspecified macular degeneration: Secondary | ICD-10-CM | POA: Diagnosis not present

## 2017-10-04 DIAGNOSIS — F4010 Social phobia, unspecified: Secondary | ICD-10-CM | POA: Diagnosis not present

## 2017-10-04 DIAGNOSIS — H5202 Hypermetropia, left eye: Secondary | ICD-10-CM | POA: Diagnosis not present

## 2017-12-01 ENCOUNTER — Other Ambulatory Visit: Payer: Self-pay | Admitting: Nurse Practitioner

## 2017-12-01 DIAGNOSIS — F5104 Psychophysiologic insomnia: Secondary | ICD-10-CM

## 2017-12-01 MED ORDER — ZOLPIDEM TARTRATE ER 12.5 MG PO TBCR: 12.5000 mg | EXTENDED_RELEASE_TABLET | Freq: Every evening | ORAL | 1 refills | Status: DC | PRN

## 2017-12-01 NOTE — Telephone Encounter (Signed)
Pt calling asking for a refill of zolpidem (AMBIEN CR) 12.5 MG CR tablet, please send to  [place], [place] [phone] (Phone) [phone] (Fax)   Pt is scheduled for 6 month f/u for 02-14-2018 and is on wait list for earlier appointment

## 2017-12-01 NOTE — Addendum Note (Signed)
Addended by: Minna Antis on: 12/01/2017 03:55 PM   Modules accepted: Orders

## 2017-12-01 NOTE — Telephone Encounter (Signed)
Ambien refill Rx on Dr Tobey Grim desk for signature.

## 2017-12-02 NOTE — Telephone Encounter (Signed)
Fax confirmation received for [facility] [phone].

## 2017-12-13 DIAGNOSIS — F4323 Adjustment disorder with mixed anxiety and depressed mood: Secondary | ICD-10-CM | POA: Diagnosis not present

## 2017-12-20 ENCOUNTER — Other Ambulatory Visit: Payer: Self-pay | Admitting: Internal Medicine

## 2017-12-20 DIAGNOSIS — Z1231 Encounter for screening mammogram for malignant neoplasm of breast: Secondary | ICD-10-CM

## 2017-12-21 ENCOUNTER — Ambulatory Visit
Admission: RE | Admit: 2017-12-21 | Discharge: 2017-12-21 | Disposition: A | Discharge status: Home / Self Care | Payer: 59 | Source: Ambulatory Visit | Attending: Internal Medicine | Admitting: Internal Medicine

## 2017-12-21 DIAGNOSIS — Z1231 Encounter for screening mammogram for malignant neoplasm of breast: Secondary | ICD-10-CM | POA: Diagnosis not present

## 2017-12-24 NOTE — Progress Notes (Signed)
Chief Complaint  Patient presents with  . Annual Exam    No new concerns    HPI: Patient  Tiffany Carson  52 y.o. comes in today for Preventive Health Care visit   Varicose veins were rx an ddid well  Per dr Kellie Simmering.  rls better .  On meds per neuro and dr Johnnye Sima sleep and attention problem s Doing pretty well at this time   Neg fam hx colon cancer nos x says doesn't have sleep apnea   Health Maintenance  Topic Date Due  . Hepatitis C Screening  06-03-1966  . HIV Screening  06/01/1979  . COLONOSCOPY  05/31/2014  . PAP SMEAR  10/11/2014  . INFLUENZA VACCINE  06/02/2017  . MAMMOGRAM  12/22/2019  . TETANUS/TDAP  10/11/2021   Health Maintenance Review LIFESTYLE:  Exercise:   thas been sad .  Busy .  Tobacco/ETS:no Alcohol:   minimal Sugar beverages: sugar in coffe .  Sleep: take ambien   Cr  Helps some.  Drug use: no  HH of  1 2 dogs  Work:  40   ROS:  GEN/ HEENT: No fever, significant weight changes sweats headaches vision problems hearing changes, CV/ PULM; No chest pain shortness of breath cough, syncope,edema  change in exercise tolerance. GI /GU: No adominal pain, vomiting, change in bowel habits. No blood in the stool. No significant GU symptoms. SKIN/HEME: ,no acute skin rashes suspicious lesions or bleeding. No lymphadenopathy, nodules, masses.  NEURO/ PSYCH:  No neurologic signs such as weakness numbness. No depression anxiety. IMM/ Allergy: No unusual infections.  Allergy .   REST of 12 system review negative except as per HPI   Past Medical History:  Diagnosis Date  . ABDOMINAL PAIN RIGHT LOWER QUADRANT 10/03/2010  . Acquired absence of both cervix and uterus 10/03/2010  . ADHD (attention deficit hyperactivity disorder)    per dr Johnnye Sima  see notes  . ANEMIA, IRON DEFICIENCY 07/02/2010  . COUGH, CHRONIC 04/09/2010   pt denies  . FATIGUE 06/24/2009  . FIBROIDS, UTERUS 09/03/2010  . GERD 08/08/2007  . HYPERLIPIDEMIA 08/08/2007  . Irregular menstrual  cycle 06/24/2009  . LEG CRAMPS 06/24/2009  . LEG PAIN, RIGHT 10/03/2010  . OBESITY 06/24/2009  . SLEEPLESSNESS 11/16/2007  . TINGLING 10/03/2010  . TREMOR 07/02/2010  . VITAMIN D DEFICIENCY 06/02/2010    Past Surgical History:  Procedure Laterality Date  . ABDOMINAL HYSTERECTOMY  nov 2011   secondary infection  had adhesions  . BREAST REDUCTION SURGERY  09 20 12  . BREAST SURGERY    . CHOLECYSTECTOMY    . OOPHORECTOMY     left   salpingesctomy   . REDUCTION MAMMAPLASTY Bilateral 07/2011    Family History  Problem Relation Age of Onset  . Diabetes Mother   . Hypertension Mother   . Diabetes Brother 22       2013, type 2  . Arthritis Unknown   . Hypertension Unknown   . Colon cancer Neg Hx     Social History   Socioeconomic History  . Marital status: Single    Spouse name: n/a  . Number of children: 0  . Years of education: 44  . Highest education level: None  Social Needs  . Financial resource strain: None  . Food insecurity - worry: None  . Food insecurity - inability: None  . Transportation needs - medical: None  . Transportation needs - non-medical: None  Occupational History  . Occupation: NUCLEAR MEDICINE  Employer: [employer]    Comment: [employer]  Tobacco Use  . Smoking status: Never Smoker  . Smokeless tobacco: Never Used  Substance and Sexual Activity  . Alcohol use: Yes    Alcohol/week: 0.0 - 0.6 oz    Comment: rarely  . Drug use: No  . Sexual activity: No    Birth control/protection: None  Other Topics Concern  . None  Social History Narrative   Lives alone, with 1 dog.   Cousins live nearby.  Siblings are in middle [place].   Neg td  ocass etoh 1 caffeine per day    40 hours  Per week.    Patient is right-handed.   Patient has a Haematologist.          Outpatient Medications Prior to Visit  Medication Sig Dispense Refill  . ibuprofen (ADVIL,MOTRIN) 200 MG tablet Take 200 mg by mouth every 6 (six)  hours as needed.      . Vitamin D, Cholecalciferol, 400 UNITS CHEW Chew by mouth.     Marland Kitchen VYVANSE 20 MG capsule Take 20 mg by mouth daily at 3 pm.  0  . VYVANSE 40 MG capsule Take 40 mg by mouth every morning.  0  . zolpidem (AMBIEN CR) 12.5 MG CR tablet Take 1 tablet (12.5 mg total) by mouth at bedtime as needed. 90 tablet 1   No facility-administered medications prior to visit.      EXAM:  BP 124/88 (BP Location: Right Arm, Patient Position: Sitting, Cuff Size: Normal)   Pulse (!) 113   Temp (!) 97.5 F (36.4 C) (Oral)   Ht _0  (1.6 m)   Wt 176 lb 9.6 oz (80.1 kg)   BMI 31.28 kg/m   Body mass index is 31.28 kg/m. Wt Readings from Last 3 Encounters:  12/27/17 176 lb 9.6 oz (80.1 kg)  12/07/16 189 lb 6.4 oz (85.9 kg)  08/17/16 175 lb (79.4 kg)    Physical Exam: Vital signs reviewed PYK:DXIP is a well-developed well-nourished alert cooperative    who appearsr stated age in no acute distress.  HEENT: normocephalic atraumatic , Eyes: PERRL EOM's full, conjunctiva clear, Nares: paten,t no deformity discharge or tenderness., Ears: no deformity EAC's clear TMs with normal landmarks. Mouth: clear OP, no lesions, edema.  Moist mucous membranes. Dentition in adequate repair. NECK: supple without masses, thyromegaly or bruits. CHEST/PULM:  Clear to auscultation and percussion breath sounds equal no wheeze , rales or rhonchi. No chest wall deformities or tenderness. Breast: normal by inspection . No dimpling, discharge, masses, tenderness or discharge .  Breast reduction scars  CV: PMI is nondisplaced, S1 S2 no gallops, murmurs, rubs. Peripheral pulses are full without delay.No JVD .  ABDOMEN: Bowel sounds normal nontender  No guard or rebound, no hepato splenomegal no CVA tenderness.   Extremtities:  No clubbing cyanosis or edema, no acute joint swelling or redness no focal atrophy  No VV looks well.  NEURO:  Oriented x3, cranial nerves 3-12 appear to be intact, no obvious focal  weakness,gait within normal limits no abnormal reflexes or asymmetrical SKIN: No acute rashes normal turgor, color, no bruising or petechiae. PSYCH: Oriented, good eye contact, no obvious depression anxiety, cognition and judgment appear normal. LN: no cervical axillary inguinal adenopathy    BP Readings from Last 3 Encounters:  12/27/17 124/88  12/07/16 117/73  08/24/16 112/79    Lab results reviewed with patient   ASSESSMENT AND PLAN:  Discussed the  following assessment and plan:  Visit for preventive health examination - Plan: Basic metabolic panel, CBC with Differential/Platelet, Hepatic function panel, Lipid panel, TSH, Hemoglobin A1c  Insomnia, unspecified type - Plan: Basic metabolic panel, CBC with Differential/Platelet, Hepatic function panel, Lipid panel, TSH, Hemoglobin A1c  Fasting hyperglycemia - Plan: Basic metabolic panel, CBC with Differential/Platelet, Hepatic function panel, Lipid panel, TSH, Hemoglobin A1c  Hyperlipidemia, unspecified hyperlipidemia type - Plan: Basic metabolic panel, CBC with Differential/Platelet, Hepatic function panel, Lipid panel, TSH, Hemoglobin A1c  Colon cancer screening - Plan: Ambulatory referral to Gastroenterology Interesting that the rx vv   Has  Stopped her Rls sx at this time  In past low iron was a trigger  Would disc belsomra with neuro sleep people  But there can be a withdrawal transition.   Patient Care Team: Maahir Horst, Standley Brooking, MD as PCP - General Princess Bruins, MD (Obstetrics and Gynecology) Crissie Reese, MD (Plastic Surgery) Dohmeier, Asencion Partridge, MD (Neurology) Thurman Coyer, DO [facility]) Patient Instructions  Will notify you  of labs when available.  healthy eating  Mediterranean  Avoiding processed carbohydrates and foods .  You will be contacted about  Colonoscopy   Screening.    Health Maintenance, Female Adopting a healthy lifestyle and getting preventive care can go a long way to promote health  and wellness. Talk with your health care provider about what schedule of regular examinations is right for you. This is a good chance for you to check in with your provider about disease prevention and staying healthy. In between checkups, there are plenty of things you can do on your own. Experts have done a lot of research about which lifestyle changes and preventive measures are most likely to keep you healthy. Ask your health care provider for more information. Weight and diet Eat a healthy diet  Be sure to include plenty of vegetables, fruits, low-fat dairy products, and lean protein.  Do not eat a lot of foods high in solid fats, added sugars, or salt.  Get regular exercise. This is one of the most important things you can do for your health. ? Most adults should exercise for at least 150 minutes each week. The exercise should increase your heart rate and make you sweat (moderate-intensity exercise). ? Most adults should also do strengthening exercises at least twice a week. This is in addition to the moderate-intensity exercise.  Maintain a healthy weight  Body mass index (BMI) is a measurement that can be used to identify possible weight problems. It estimates body fat based on height and weight. Your health care provider can help determine your BMI and help you achieve or maintain a healthy weight.  For females 67 years of age and older: ? A BMI below 18.5 is considered underweight. ? A BMI of 18.5 to 24.9 is normal. ? A BMI of 25 to 29.9 is considered overweight. ? A BMI of 30 and above is considered obese.  Watch levels of cholesterol and blood lipids  You should start having your blood tested for lipids and cholesterol at 54 years of age, then have this test every 5 years.  You may need to have your cholesterol levels checked more often if: ? Your lipid or cholesterol levels are high. ? You are older than 54 years of age. ? You are at high risk for heart disease.  Cancer  screening Lung Cancer  Lung cancer screening is recommended for adults 73-3 years old who are at high risk for lung cancer  because of a history of smoking.  A yearly low-dose CT scan of the lungs is recommended for people who: ? Currently smoke. ? Have quit within the past 15 years. ? Have at least a 30-pack-year history of smoking. A pack year is smoking an average of one pack of cigarettes a day for 1 year.  Yearly screening should continue until it has been 15 years since you quit.  Yearly screening should stop if you develop a health problem that would prevent you from having lung cancer treatment.  Breast Cancer  Practice breast self-awareness. This means understanding how your breasts normally appear and feel.  It also means doing regular breast self-exams. Let your health care provider know about any changes, no matter how small.  If you are in your 20s or 30s, you should have a clinical breast exam (CBE) by a health care provider every 1-3 years as part of a regular health exam.  If you are 63 or older, have a CBE every year. Also consider having a breast X-ray (mammogram) every year.  If you have a family history of breast cancer, talk to your health care provider about genetic screening.  If you are at high risk for breast cancer, talk to your health care provider about having an MRI and a mammogram every year.  Breast cancer gene (BRCA) assessment is recommended for women who have family members with BRCA-related cancers. BRCA-related cancers include: ? Breast. ? Ovarian. ? Tubal. ? Peritoneal cancers.  Results of the assessment will determine the need for genetic counseling and BRCA1 and BRCA2 testing.  Cervical Cancer Your health care provider may recommend that you be screened regularly for cancer of the pelvic organs (ovaries, uterus, and vagina). This screening involves a pelvic examination, including checking for microscopic changes to the surface of your cervix  (Pap test). You may be encouraged to have this screening done every 3 years, beginning at age 14.  For women ages 30-65, health care providers may recommend pelvic exams and Pap testing every 3 years, or they may recommend the Pap and pelvic exam, combined with testing for human papilloma virus (HPV), every 5 years. Some types of HPV increase your risk of cervical cancer. Testing for HPV may also be done on women of any age with unclear Pap test results.  Other health care providers may not recommend any screening for nonpregnant women who are considered low risk for pelvic cancer and who do not have symptoms. Ask your health care provider if a screening pelvic exam is right for you.  If you have had past treatment for cervical cancer or a condition that could lead to cancer, you need Pap tests and screening for cancer for at least 20 years after your treatment. If Pap tests have been discontinued, your risk factors (such as having a new sexual partner) need to be reassessed to determine if screening should resume. Some women have medical problems that increase the chance of getting cervical cancer. In these cases, your health care provider may recommend more frequent screening and Pap tests.  Colorectal Cancer  This type of cancer can be detected and often prevented.  Routine colorectal cancer screening usually begins at 54 years of age and continues through 54 years of age.  Your health care provider may recommend screening at an earlier age if you have risk factors for colon cancer.  Your health care provider may also recommend using home test kits to check for hidden blood in the stool.  A small camera at the end of a tube can be used to examine your colon directly (sigmoidoscopy or colonoscopy). This is done to check for the earliest forms of colorectal cancer.  Routine screening usually begins at age 62.  Direct examination of the colon should be repeated every 5-10 years through 54 years  of age. However, you may need to be screened more often if early forms of precancerous polyps or small growths are found.  Skin Cancer  Check your skin from head to toe regularly.  Tell your health care provider about any new moles or changes in moles, especially if there is a change in a mole's shape or color.  Also tell your health care provider if you have a mole that is larger than the size of a pencil eraser.  Always use sunscreen. Apply sunscreen liberally and repeatedly throughout the day.  Protect yourself by wearing long sleeves, pants, a wide-brimmed hat, and sunglasses whenever you are outside.  Heart disease, diabetes, and high blood pressure  High blood pressure causes heart disease and increases the risk of stroke. High blood pressure is more likely to develop in: ? People who have blood pressure in the high end of the normal range (130-139/85-89 mm Hg). ? People who are overweight or obese. ? People who are African American.  If you are 72-80 years of age, have your blood pressure checked every 3-5 years. If you are 64 years of age or older, have your blood pressure checked every year. You should have your blood pressure measured twice-once when you are at a hospital or clinic, and once when you are not at a hospital or clinic. Record the average of the two measurements. To check your blood pressure when you are not at a hospital or clinic, you can use: ? An automated blood pressure machine at a pharmacy. ? A home blood pressure monitor.  If you are between 35 years and 38 years old, ask your health care provider if you should take aspirin to prevent strokes.  Have regular diabetes screenings. This involves taking a blood sample to check your fasting blood sugar level. ? If you are at a normal weight and have a low risk for diabetes, have this test once every three years after 54 years of age. ? If you are overweight and have a high risk for diabetes, consider being tested  at a younger age or more often. Preventing infection Hepatitis B  If you have a higher risk for hepatitis B, you should be screened for this virus. You are considered at high risk for hepatitis B if: ? You were born in a country where hepatitis B is common. Ask your health care provider which countries are considered high risk. ? Your parents were born in a high-risk country, and you have not been immunized against hepatitis B (hepatitis B vaccine). ? You have HIV or AIDS. ? You use needles to inject street drugs. ? You live with someone who has hepatitis B. ? You have had sex with someone who has hepatitis B. ? You get hemodialysis treatment. ? You take certain medicines for conditions, including cancer, organ transplantation, and autoimmune conditions.  Hepatitis C  Blood testing is recommended for: ? Everyone born from [birth year] through 1967. ? Anyone with known risk factors for hepatitis C.  Sexually transmitted infections (STIs)  You should be screened for sexually transmitted infections (STIs) including gonorrhea and chlamydia if: ? You are sexually active and are younger than 54  years of age. ? You are older than 54 years of age and your health care provider tells you that you are at risk for this type of infection. ? Your sexual activity has changed since you were last screened and you are at an increased risk for chlamydia or gonorrhea. Ask your health care provider if you are at risk.  If you do not have HIV, but are at risk, it may be recommended that you take a prescription medicine daily to prevent HIV infection. This is called pre-exposure prophylaxis (PrEP). You are considered at risk if: ? You are sexually active and do not regularly use condoms or know the HIV status of your partner(s). ? You take drugs by injection. ? You are sexually active with a partner who has HIV.  Talk with your health care provider about whether you are at high risk of being infected with HIV. If  you choose to begin PrEP, you should first be tested for HIV. You should then be tested every 3 months for as long as you are taking PrEP. Pregnancy  If you are premenopausal and you may become pregnant, ask your health care provider about preconception counseling.  If you may become pregnant, take 400 to 800 micrograms (mcg) of folic acid every day.  If you want to prevent pregnancy, talk to your health care provider about birth control (contraception). Osteoporosis and menopause  Osteoporosis is a disease in which the bones lose minerals and strength with aging. This can result in serious bone fractures. Your risk for osteoporosis can be identified using a bone density scan.  If you are 13 years of age or older, or if you are at risk for osteoporosis and fractures, ask your health care provider if you should be screened.  Ask your health care provider whether you should take a calcium or vitamin D supplement to lower your risk for osteoporosis.  Menopause may have certain physical symptoms and risks.  Hormone replacement therapy may reduce some of these symptoms and risks. Talk to your health care provider about whether hormone replacement therapy is right for you. Follow these instructions at home:  Schedule regular health, dental, and eye exams.  Stay current with your immunizations.  Do not use any tobacco products including cigarettes, chewing tobacco, or electronic cigarettes.  If you are pregnant, do not drink alcohol.  If you are breastfeeding, limit how much and how often you drink alcohol.  Limit alcohol intake to no more than 1 drink per day for nonpregnant women. One drink equals 12 ounces of beer, 5 ounces of wine, or 1 ounces of hard liquor.  Do not use street drugs.  Do not share needles.  Ask your health care provider for help if you need support or information about quitting drugs.  Tell your health care provider if you often feel depressed.  Tell your  health care provider if you have ever been abused or do not feel safe at home. This information is not intended to replace advice given to you by your health care provider. Make sure you discuss any questions you have with your health care provider. Document Released: 05/04/2011 Document Revised: 03/26/2016 Document Reviewed: 07/23/2015 Elsevier Interactive Patient Education  2018 [place] refers to food and lifestyle choices that are based on the traditions of countries located on the [employer]. This way of eating has been shown to help prevent certain conditions and improve outcomes for people who have  chronic diseases, like kidney disease and heart disease. What are tips for following this plan? Lifestyle  Cook and eat meals together with your family, when possible.  Drink enough fluid to keep your urine clear or pale yellow.  Be physically active every day. This includes: ? Aerobic exercise like running or swimming. ? Leisure activities like gardening, walking, or housework.  Get 7-8 hours of sleep each night.  If recommended by your health care provider, drink red wine in moderation. This means 1 glass a day for nonpregnant women and 2 glasses a day for men. A glass of wine equals 5 oz (150 mL). Reading food labels  Check the serving size of packaged foods. For foods such as rice and pasta, the serving size refers to the amount of cooked product, not dry.  Check the total fat in packaged foods. Avoid foods that have saturated fat or trans fats.  Check the ingredients list for added sugars, such as corn syrup. Shopping  At the grocery store, buy most of your food from the areas near the walls of the store. This includes: ? Fresh fruits and vegetables (produce). ? Grains, beans, nuts, and seeds. Some of these may be available in unpackaged forms or large amounts (in bulk). ? Fresh seafood. ? Poultry and eggs. ? Low-fat  dairy products.  Buy whole ingredients instead of prepackaged foods.  Buy fresh fruits and vegetables in-season from local farmers markets.  Buy frozen fruits and vegetables in resealable bags.  If you do not have access to quality fresh seafood, buy precooked frozen shrimp or canned fish, such as tuna, salmon, or sardines.  Buy small amounts of raw or cooked vegetables, salads, or olives from the deli or salad bar at your store.  Stock your pantry so you always have certain foods on hand, such as olive oil, canned tuna, canned tomatoes, rice, pasta, and beans. Cooking  Cook foods with extra-virgin olive oil instead of using butter or other vegetable oils.  Have meat as a side dish, and have vegetables or grains as your main dish. This means having meat in small portions or adding small amounts of meat to foods like pasta or stew.  Use beans or vegetables instead of meat in common dishes like chili or lasagna.  Experiment with different cooking methods. Try roasting or broiling vegetables instead of steaming or sauteing them.  Add frozen vegetables to soups, stews, pasta, or rice.  Add nuts or seeds for added healthy fat at each meal. You can add these to yogurt, salads, or vegetable dishes.  Marinate fish or vegetables using olive oil, lemon juice, garlic, and fresh herbs. Meal planning  Plan to eat 1 vegetarian meal one day each week. Try to work up to 2 vegetarian meals, if possible.  Eat seafood 2 or more times a week.  Have healthy snacks readily available, such as: ? Vegetable sticks with hummus. ? [place] yogurt. ? Fruit and nut trail mix.  Eat balanced meals throughout the week. This includes: ? Fruit: 2-3 servings a day ? Vegetables: 4-5 servings a day ? Low-fat dairy: 2 servings a day ? Fish, poultry, or lean meat: 1 serving a day ? Beans and legumes: 2 or more servings a week ? Nuts and seeds: 1-2 servings a day ? Whole grains: 6-8 servings a  day ? Extra-virgin olive oil: 3-4 servings a day  Limit red meat and sweets to only a few servings a month What are my food choices?  Mediterranean diet ?  Recommended ? Grains: Whole-grain pasta. Brown rice. Bulgar wheat. Polenta. Couscous. Whole-wheat bread. [place]. ? Vegetables: Artichokes. Beets. Broccoli. Cabbage. Carrots. Eggplant. Green beans. Chard. Kale. Spinach. Onions. Leeks. Peas. Squash. Tomatoes. Peppers. Radishes. ? Fruits: Apples. Apricots. Avocado. Berries. Bananas. Cherries. Dates. Figs. Grapes. Lemons. Melon. Oranges. Peaches. Plums. Pomegranate. ? Meats and other protein foods: Beans. Almonds. Sunflower seeds. Pine nuts. Peanuts. [place]. Salmon. Scallops. Shrimp. [place]. Tilapia. Clams. Oysters. Eggs. ? Dairy: Low-fat milk. Cheese. Greek yogurt. ? Beverages: Water. Red wine. Herbal tea. ? Fats and oils: Extra virgin olive oil. Avocado oil. Grape seed oil. ? Sweets and desserts: [place] yogurt with honey. Baked apples. Poached pears. Trail mix. ? Seasoning and other foods: Basil. Cilantro. Coriander. Cumin. Mint. Parsley. Sage. Rosemary. Tarragon. Garlic. Oregano. Thyme. Pepper. Balsalmic vinegar. Tahini. Hummus. Tomato sauce. Olives. Mushrooms. ? Limit these ? Grains: Prepackaged pasta or rice dishes. Prepackaged cereal with added sugar. ? Vegetables: Deep fried potatoes (french fries). ? Fruits: Fruit canned in syrup. ? Meats and other protein foods: Beef. Pork. Lamb. Poultry with skin. Hot dogs. Berniece Salines. ? Dairy: Ice cream. Sour cream. Whole milk. ? Beverages: Juice. Sugar-sweetened soft drinks. Beer. Liquor and spirits. ? Fats and oils: Butter. Canola oil. Vegetable oil. Beef fat (tallow). Lard. ? Sweets and desserts: Cookies. Cakes. Pies. Candy. ? Seasoning and other foods: Mayonnaise. Premade sauces and marinades. ? The items listed may not be a complete list. Talk with your dietitian about what dietary choices are right for you. Summary  The Mediterranean diet  includes both food and lifestyle choices.  Eat a variety of fresh fruits and vegetables, beans, nuts, seeds, and whole grains.  Limit the amount of red meat and sweets that you eat.  Talk with your health care provider about whether it is safe for you to drink red wine in moderation. This means 1 glass a day for nonpregnant women and 2 glasses a day for men. A glass of wine equals 5 oz (150 mL). This information is not intended to replace advice given to you by your health care provider. Make sure you discuss any questions you have with your health care provider. Document Released: 06/11/2016 Document Revised: 07/14/2016 Document Reviewed: 06/11/2016 Elsevier Interactive Patient Education  2018 [place]. Aleenah Homen M.D.

## 2017-12-27 ENCOUNTER — Encounter: Payer: Self-pay | Admitting: Internal Medicine

## 2017-12-27 ENCOUNTER — Ambulatory Visit (INDEPENDENT_AMBULATORY_CARE_PROVIDER_SITE_OTHER): Payer: 59 | Admitting: Internal Medicine

## 2017-12-27 VITALS — BP 124/88 | HR 113 | Temp 97.5°F | Ht 63.0 in | Wt 176.6 lb

## 2017-12-27 DIAGNOSIS — R7301 Impaired fasting glucose: Secondary | ICD-10-CM

## 2017-12-27 DIAGNOSIS — Z0000 Encounter for general adult medical examination without abnormal findings: Secondary | ICD-10-CM | POA: Diagnosis not present

## 2017-12-27 DIAGNOSIS — E785 Hyperlipidemia, unspecified: Secondary | ICD-10-CM

## 2017-12-27 DIAGNOSIS — Z1211 Encounter for screening for malignant neoplasm of colon: Secondary | ICD-10-CM

## 2017-12-27 DIAGNOSIS — G4700 Insomnia, unspecified: Secondary | ICD-10-CM

## 2017-12-27 LAB — HEPATIC FUNCTION PANEL
ALK PHOS: 84 U/L (ref 39–117)
ALT: 21 U/L (ref 0–35)
AST: 18 U/L (ref 0–37)
Albumin: 4.6 g/dL (ref 3.5–5.2)
BILIRUBIN DIRECT: 0.1 mg/dL (ref 0.0–0.3)
BILIRUBIN TOTAL: 0.3 mg/dL (ref 0.2–1.2)
Total Protein: 7.2 g/dL (ref 6.0–8.3)

## 2017-12-27 LAB — LIPID PANEL
CHOL/HDL RATIO: 4
Cholesterol: 227 mg/dL — ABNORMAL HIGH (ref 0–200)
HDL: 57.5 mg/dL (ref >39.00)
LDL CALC: 152 mg/dL — AB (ref 0–99)
NONHDL: 169.08
TRIGLYCERIDES: 84 mg/dL (ref 0.0–149.0)
VLDL: 16.8 mg/dL (ref 0.0–40.0)

## 2017-12-27 LAB — CBC WITH DIFFERENTIAL/PLATELET
BASOS ABS: 0.1 10*3/uL (ref 0.0–0.1)
BASOS PCT: 0.5 % (ref 0.0–3.0)
EOS ABS: 0.1 10*3/uL (ref 0.0–0.7)
Eosinophils Relative: 1.3 % (ref 0.0–5.0)
HEMATOCRIT: 46.1 % — AB (ref 36.0–46.0)
Hemoglobin: 15.8 g/dL — ABNORMAL HIGH (ref 12.0–15.0)
LYMPHS PCT: 15.6 % (ref 12.0–46.0)
Lymphs Abs: 1.7 10*3/uL (ref 0.7–4.0)
MCHC: 34.3 g/dL (ref 30.0–36.0)
MCV: 88.5 fl (ref 78.0–100.0)
MONO ABS: 0.7 10*3/uL (ref 0.1–1.0)
Monocytes Relative: 6.1 % (ref 3.0–12.0)
Neutro Abs: 8.1 10*3/uL — ABNORMAL HIGH (ref 1.4–7.7)
Neutrophils Relative %: 76.5 % (ref 43.0–77.0)
Platelets: 428 10*3/uL — ABNORMAL HIGH (ref 150.0–400.0)
RBC: 5.21 Mil/uL — AB (ref 3.87–5.11)
RDW: 13.2 % (ref 11.5–15.5)
WBC: 10.6 10*3/uL — AB (ref 4.0–10.5)

## 2017-12-27 LAB — BASIC METABOLIC PANEL
BUN: 15 mg/dL (ref 6–23)
CALCIUM: 10.4 mg/dL (ref 8.4–10.5)
CHLORIDE: 103 meq/L (ref 96–112)
CO2: 28 mEq/L (ref 19–32)
CREATININE: 0.85 mg/dL (ref 0.40–1.20)
GFR: 74.2 mL/min (ref >60.00)
Glucose, Bld: 116 mg/dL — ABNORMAL HIGH (ref 70–99)
Potassium: 4.1 mEq/L (ref 3.5–5.1)
Sodium: 141 mEq/L (ref 135–145)

## 2017-12-27 LAB — TSH: TSH: 1.68 u[IU]/mL (ref 0.35–4.50)

## 2017-12-27 LAB — HEMOGLOBIN A1C: Hgb A1c MFr Bld: 6.1 % (ref 4.6–6.5)

## 2017-12-27 NOTE — Patient Instructions (Signed)
Will notify you  of labs when available.  healthy eating  Mediterranean  Avoiding processed carbohydrates and foods .  You will be contacted about  Colonoscopy   Screening.    Health Maintenance, Female Adopting a healthy lifestyle and getting preventive care can go a long way to promote health and wellness. Talk with your health care provider about what schedule of regular examinations is right for you. This is a good chance for you to check in with your provider about disease prevention and staying healthy. In between checkups, there are plenty of things you can do on your own. Experts have done a lot of research about which lifestyle changes and preventive measures are most likely to keep you healthy. Ask your health care provider for more information. Weight and diet Eat a healthy diet  Be sure to include plenty of vegetables, fruits, low-fat dairy products, and lean protein.  Do not eat a lot of foods high in solid fats, added sugars, or salt.  Get regular exercise. This is one of the most important things you can do for your health. ? Most adults should exercise for at least 150 minutes each week. The exercise should increase your heart rate and make you sweat (moderate-intensity exercise). ? Most adults should also do strengthening exercises at least twice a week. This is in addition to the moderate-intensity exercise.  Maintain a healthy weight  Body mass index (BMI) is a measurement that can be used to identify possible weight problems. It estimates body fat based on height and weight. Your health care provider can help determine your BMI and help you achieve or maintain a healthy weight.  For females 70 years of age and older: ? A BMI below 18.5 is considered underweight. ? A BMI of 18.5 to 24.9 is normal. ? A BMI of 25 to 29.9 is considered overweight. ? A BMI of 30 and above is considered obese.  Watch levels of cholesterol and blood lipids  You should start having your  blood tested for lipids and cholesterol at 54 years of age, then have this test every 5 years.  You may need to have your cholesterol levels checked more often if: ? Your lipid or cholesterol levels are high. ? You are older than 54 years of age. ? You are at high risk for heart disease.  Cancer screening Lung Cancer  Lung cancer screening is recommended for adults 50-37 years old who are at high risk for lung cancer because of a history of smoking.  A yearly low-dose CT scan of the lungs is recommended for people who: ? Currently smoke. ? Have quit within the past 15 years. ? Have at least a 30-pack-year history of smoking. A pack year is smoking an average of one pack of cigarettes a day for 1 year.  Yearly screening should continue until it has been 15 years since you quit.  Yearly screening should stop if you develop a health problem that would prevent you from having lung cancer treatment.  Breast Cancer  Practice breast self-awareness. This means understanding how your breasts normally appear and feel.  It also means doing regular breast self-exams. Let your health care provider know about any changes, no matter how small.  If you are in your 20s or 30s, you should have a clinical breast exam (CBE) by a health care provider every 1-3 years as part of a regular health exam.  If you are 76 or older, have a CBE every year. Also  consider having a breast X-ray (mammogram) every year.  If you have a family history of breast cancer, talk to your health care provider about genetic screening.  If you are at high risk for breast cancer, talk to your health care provider about having an MRI and a mammogram every year.  Breast cancer gene (BRCA) assessment is recommended for women who have family members with BRCA-related cancers. BRCA-related cancers include: ? Breast. ? Ovarian. ? Tubal. ? Peritoneal cancers.  Results of the assessment will determine the need for genetic  counseling and BRCA1 and BRCA2 testing.  Cervical Cancer Your health care provider may recommend that you be screened regularly for cancer of the pelvic organs (ovaries, uterus, and vagina). This screening involves a pelvic examination, including checking for microscopic changes to the surface of your cervix (Pap test). You may be encouraged to have this screening done every 3 years, beginning at age 75.  For women ages 29-65, health care providers may recommend pelvic exams and Pap testing every 3 years, or they may recommend the Pap and pelvic exam, combined with testing for human papilloma virus (HPV), every 5 years. Some types of HPV increase your risk of cervical cancer. Testing for HPV may also be done on women of any age with unclear Pap test results.  Other health care providers may not recommend any screening for nonpregnant women who are considered low risk for pelvic cancer and who do not have symptoms. Ask your health care provider if a screening pelvic exam is right for you.  If you have had past treatment for cervical cancer or a condition that could lead to cancer, you need Pap tests and screening for cancer for at least 20 years after your treatment. If Pap tests have been discontinued, your risk factors (such as having a new sexual partner) need to be reassessed to determine if screening should resume. Some women have medical problems that increase the chance of getting cervical cancer. In these cases, your health care provider may recommend more frequent screening and Pap tests.  Colorectal Cancer  This type of cancer can be detected and often prevented.  Routine colorectal cancer screening usually begins at 54 years of age and continues through 54 years of age.  Your health care provider may recommend screening at an earlier age if you have risk factors for colon cancer.  Your health care provider may also recommend using home test kits to check for hidden blood in the  stool.  A small camera at the end of a tube can be used to examine your colon directly (sigmoidoscopy or colonoscopy). This is done to check for the earliest forms of colorectal cancer.  Routine screening usually begins at age 70.  Direct examination of the colon should be repeated every 5-10 years through 54 years of age. However, you may need to be screened more often if early forms of precancerous polyps or small growths are found.  Skin Cancer  Check your skin from head to toe regularly.  Tell your health care provider about any new moles or changes in moles, especially if there is a change in a mole's shape or color.  Also tell your health care provider if you have a mole that is larger than the size of a pencil eraser.  Always use sunscreen. Apply sunscreen liberally and repeatedly throughout the day.  Protect yourself by wearing long sleeves, pants, a wide-brimmed hat, and sunglasses whenever you are outside.  Heart disease, diabetes, and high blood  pressure  High blood pressure causes heart disease and increases the risk of stroke. High blood pressure is more likely to develop in: ? People who have blood pressure in the high end of the normal range (130-139/85-89 mm Hg). ? People who are overweight or obese. ? People who are African American.  If you are 7-7 years of age, have your blood pressure checked every 3-5 years. If you are 31 years of age or older, have your blood pressure checked every year. You should have your blood pressure measured twice-once when you are at a hospital or clinic, and once when you are not at a hospital or clinic. Record the average of the two measurements. To check your blood pressure when you are not at a hospital or clinic, you can use: ? An automated blood pressure machine at a pharmacy. ? A home blood pressure monitor.  If you are between 21 years and 47 years old, ask your health care provider if you should take aspirin to prevent  strokes.  Have regular diabetes screenings. This involves taking a blood sample to check your fasting blood sugar level. ? If you are at a normal weight and have a low risk for diabetes, have this test once every three years after 54 years of age. ? If you are overweight and have a high risk for diabetes, consider being tested at a younger age or more often. Preventing infection Hepatitis B  If you have a higher risk for hepatitis B, you should be screened for this virus. You are considered at high risk for hepatitis B if: ? You were born in a country where hepatitis B is common. Ask your health care provider which countries are considered high risk. ? Your parents were born in a high-risk country, and you have not been immunized against hepatitis B (hepatitis B vaccine). ? You have HIV or AIDS. ? You use needles to inject street drugs. ? You live with someone who has hepatitis B. ? You have had sex with someone who has hepatitis B. ? You get hemodialysis treatment. ? You take certain medicines for conditions, including cancer, organ transplantation, and autoimmune conditions.  Hepatitis C  Blood testing is recommended for: ? Everyone born from [birth year] through 1967. ? Anyone with known risk factors for hepatitis C.  Sexually transmitted infections (STIs)  You should be screened for sexually transmitted infections (STIs) including gonorrhea and chlamydia if: ? You are sexually active and are younger than 54 years of age. ? You are older than 54 years of age and your health care provider tells you that you are at risk for this type of infection. ? Your sexual activity has changed since you were last screened and you are at an increased risk for chlamydia or gonorrhea. Ask your health care provider if you are at risk.  If you do not have HIV, but are at risk, it may be recommended that you take a prescription medicine daily to prevent HIV infection. This is called pre-exposure prophylaxis  (PrEP). You are considered at risk if: ? You are sexually active and do not regularly use condoms or know the HIV status of your partner(s). ? You take drugs by injection. ? You are sexually active with a partner who has HIV.  Talk with your health care provider about whether you are at high risk of being infected with HIV. If you choose to begin PrEP, you should first be tested for HIV. You should then be tested every  3 months for as long as you are taking PrEP. Pregnancy  If you are premenopausal and you may become pregnant, ask your health care provider about preconception counseling.  If you may become pregnant, take 400 to 800 micrograms (mcg) of folic acid every day.  If you want to prevent pregnancy, talk to your health care provider about birth control (contraception). Osteoporosis and menopause  Osteoporosis is a disease in which the bones lose minerals and strength with aging. This can result in serious bone fractures. Your risk for osteoporosis can be identified using a bone density scan.  If you are 79 years of age or older, or if you are at risk for osteoporosis and fractures, ask your health care provider if you should be screened.  Ask your health care provider whether you should take a calcium or vitamin D supplement to lower your risk for osteoporosis.  Menopause may have certain physical symptoms and risks.  Hormone replacement therapy may reduce some of these symptoms and risks. Talk to your health care provider about whether hormone replacement therapy is right for you. Follow these instructions at home:  Schedule regular health, dental, and eye exams.  Stay current with your immunizations.  Do not use any tobacco products including cigarettes, chewing tobacco, or electronic cigarettes.  If you are pregnant, do not drink alcohol.  If you are breastfeeding, limit how much and how often you drink alcohol.  Limit alcohol intake to no more than 1 drink per day for  nonpregnant women. One drink equals 12 ounces of beer, 5 ounces of wine, or 1 ounces of hard liquor.  Do not use street drugs.  Do not share needles.  Ask your health care provider for help if you need support or information about quitting drugs.  Tell your health care provider if you often feel depressed.  Tell your health care provider if you have ever been abused or do not feel safe at home. This information is not intended to replace advice given to you by your health care provider. Make sure you discuss any questions you have with your health care provider. Document Released: 05/04/2011 Document Revised: 03/26/2016 Document Reviewed: 07/23/2015 Elsevier Interactive Patient Education  2018 [place] refers to food and lifestyle choices that are based on the traditions of countries located on the [employer]. This way of eating has been shown to help prevent certain conditions and improve outcomes for people who have chronic diseases, like kidney disease and heart disease. What are tips for following this plan? Lifestyle  Cook and eat meals together with your family, when possible.  Drink enough fluid to keep your urine clear or pale yellow.  Be physically active every day. This includes: ? Aerobic exercise like running or swimming. ? Leisure activities like gardening, walking, or housework.  Get 7-8 hours of sleep each night.  If recommended by your health care provider, drink red wine in moderation. This means 1 glass a day for nonpregnant women and 2 glasses a day for men. A glass of wine equals 5 oz (150 mL). Reading food labels  Check the serving size of packaged foods. For foods such as rice and pasta, the serving size refers to the amount of cooked product, not dry.  Check the total fat in packaged foods. Avoid foods that have saturated fat or trans fats.  Check the ingredients list for added sugars, such as corn  syrup. Shopping  At [employer],  buy most of your food from the areas near the walls of the store. This includes: ? Fresh fruits and vegetables (produce). ? Grains, beans, nuts, and seeds. Some of these may be available in unpackaged forms or large amounts (in bulk). ? Fresh seafood. ? Poultry and eggs. ? Low-fat dairy products.  Buy whole ingredients instead of prepackaged foods.  Buy fresh fruits and vegetables in-season from local farmers markets.  Buy frozen fruits and vegetables in resealable bags.  If you do not have access to quality fresh seafood, buy precooked frozen shrimp or canned fish, such as tuna, salmon, or sardines.  Buy small amounts of raw or cooked vegetables, salads, or olives from the deli or salad bar at your store.  Stock your pantry so you always have certain foods on hand, such as olive oil, canned tuna, canned tomatoes, rice, pasta, and beans. Cooking  Cook foods with extra-virgin olive oil instead of using butter or other vegetable oils.  Have meat as a side dish, and have vegetables or grains as your main dish. This means having meat in small portions or adding small amounts of meat to foods like pasta or stew.  Use beans or vegetables instead of meat in common dishes like chili or lasagna.  Experiment with different cooking methods. Try roasting or broiling vegetables instead of steaming or sauteing them.  Add frozen vegetables to soups, stews, pasta, or rice.  Add nuts or seeds for added healthy fat at each meal. You can add these to yogurt, salads, or vegetable dishes.  Marinate fish or vegetables using olive oil, lemon juice, garlic, and fresh herbs. Meal planning  Plan to eat 1 vegetarian meal one day each week. Try to work up to 2 vegetarian meals, if possible.  Eat seafood 2 or more times a week.  Have healthy snacks readily available, such as: ? Vegetable sticks with hummus. ? [place] yogurt. ? Fruit and nut trail mix.  Eat  balanced meals throughout the week. This includes: ? Fruit: 2-3 servings a day ? Vegetables: 4-5 servings a day ? Low-fat dairy: 2 servings a day ? Fish, poultry, or lean meat: 1 serving a day ? Beans and legumes: 2 or more servings a week ? Nuts and seeds: 1-2 servings a day ? Whole grains: 6-8 servings a day ? Extra-virgin olive oil: 3-4 servings a day  Limit red meat and sweets to only a few servings a month What are my food choices?  Mediterranean diet ? Recommended ? Grains: Whole-grain pasta. Brown rice. Bulgar wheat. Polenta. Couscous. Whole-wheat bread. [place]. ? Vegetables: Artichokes. Beets. Broccoli. Cabbage. Carrots. Eggplant. Green beans. Chard. Kale. Spinach. Onions. Leeks. Peas. Squash. Tomatoes. Peppers. Radishes. ? Fruits: Apples. Apricots. Avocado. Berries. Bananas. Cherries. Dates. Figs. Grapes. Lemons. Melon. Oranges. Peaches. Plums. Pomegranate. ? Meats and other protein foods: Beans. Almonds. Sunflower seeds. Pine nuts. Peanuts. [place]. Salmon. Scallops. Shrimp. [place]. Tilapia. Clams. Oysters. Eggs. ? Dairy: Low-fat milk. Cheese. Greek yogurt. ? Beverages: Water. Red wine. Herbal tea. ? Fats and oils: Extra virgin olive oil. Avocado oil. Grape seed oil. ? Sweets and desserts: [place] yogurt with honey. Baked apples. Poached pears. Trail mix. ? Seasoning and other foods: Basil. Cilantro. Coriander. Cumin. Mint. Parsley. Sage. Rosemary. Tarragon. Garlic. Oregano. Thyme. Pepper. Balsalmic vinegar. Tahini. Hummus. Tomato sauce. Olives. Mushrooms. ? Limit these ? Grains: Prepackaged pasta or rice dishes. Prepackaged cereal with added sugar. ? Vegetables: Deep fried potatoes (french fries). ? Fruits: Fruit canned in syrup. ? Meats and other  protein foods: Beef. Pork. Lamb. Poultry with skin. Hot dogs. Berniece Salines. ? Dairy: Ice cream. Sour cream. Whole milk. ? Beverages: Juice. Sugar-sweetened soft drinks. Beer. Liquor and spirits. ? Fats and oils: Butter. Canola oil.  Vegetable oil. Beef fat (tallow). Lard. ? Sweets and desserts: Cookies. Cakes. Pies. Candy. ? Seasoning and other foods: Mayonnaise. Premade sauces and marinades. ? The items listed may not be a complete list. Talk with your dietitian about what dietary choices are right for you. Summary  The Mediterranean diet includes both food and lifestyle choices.  Eat a variety of fresh fruits and vegetables, beans, nuts, seeds, and whole grains.  Limit the amount of red meat and sweets that you eat.  Talk with your health care provider about whether it is safe for you to drink red wine in moderation. This means 1 glass a day for nonpregnant women and 2 glasses a day for men. A glass of wine equals 5 oz (150 mL). This information is not intended to replace advice given to you by your health care provider. Make sure you discuss any questions you have with your health care provider. Document Released: 06/11/2016 Document Revised: 07/14/2016 Document Reviewed: 06/11/2016 Elsevier Interactive Patient Education  [employer].

## 2018-01-03 DIAGNOSIS — Z79899 Other long term (current) drug therapy: Secondary | ICD-10-CM | POA: Diagnosis not present

## 2018-01-03 DIAGNOSIS — F902 Attention-deficit hyperactivity disorder, combined type: Secondary | ICD-10-CM | POA: Diagnosis not present

## 2018-01-10 DIAGNOSIS — F4323 Adjustment disorder with mixed anxiety and depressed mood: Secondary | ICD-10-CM | POA: Diagnosis not present

## 2018-02-01 DIAGNOSIS — F4323 Adjustment disorder with mixed anxiety and depressed mood: Secondary | ICD-10-CM | POA: Diagnosis not present

## 2018-02-11 NOTE — Progress Notes (Signed)
[facility]  PATIENT: Tiffany Carson DOB: 1966-02-01   REASON FOR VISIT: insomnia HISTORY FROM: Patient    HISTORY OF PRESENT ILLNESS: Tiffany Carson, 52 year old female returns for follow-up with history of insomnia and task symmetric dystonia. Sleep study in the past has not revealed  organic sleep disorder of significance. She is currently on Ambien and gets about 4-5 hours of sleep nightly. Her ESS score is 3. She rarely naps. She denies any sleepwalking or episodes of sleep eating. She currently works in Interior and spatial designer.She  needs refills on her Ambien. She is on Vyvanse for attention deficit disorder .She was recently placed on clonidine to help with sleep by Rachel Moulds.  She returns for reevaluation   REVIEW OF SYSTEMS: Full 14 system review of systems performed and notable only for those listed, all others are neg:  Constitutional: neg  Cardiovascular: neg Ear/Nose/Throat: neg  Skin: neg Eyes: neg Respiratory: neg Gastroitestinal: neg  Hematology/Lymphatic: neg  Endocrine: neg Musculoskeletal:neg Allergy/Immunology: neg Neurological: neg Psychiatric: neg Sleep : Insomnia   ALLERGIES: No Known Allergies  HOME MEDICATIONS: Outpatient Medications Prior to Visit  Medication Sig Dispense Refill  . cloNIDine (CATAPRES) 0.1 MG tablet Take 0.1-0.2 mg by mouth daily.   2  . ibuprofen (ADVIL,MOTRIN) 200 MG tablet Take 200 mg by mouth every 6 (six) hours as needed.      . Omega-3 Fatty Acids (FISH OIL) 1000 MG CPDR Take 1 tablet by mouth daily.    . Vitamin D, Cholecalciferol, 400 UNITS CHEW Chew by mouth.     Marland Kitchen VYVANSE 20 MG capsule Take 20 mg by mouth daily at 3 pm.  0  . VYVANSE 40 MG capsule Take 40 mg by mouth every morning.  0  . zolpidem (AMBIEN CR) 12.5 MG CR tablet Take 1 tablet (12.5 mg total) by mouth at bedtime as needed. 90 tablet 1   No facility-administered medications prior to visit.     PAST MEDICAL HISTORY: Past Medical  History:  Diagnosis Date  . ABDOMINAL PAIN RIGHT LOWER QUADRANT 10/03/2010  . Acquired absence of both cervix and uterus 10/03/2010  . ADHD (attention deficit hyperactivity disorder)    per dr Johnnye Sima  see notes  . ANEMIA, IRON DEFICIENCY 07/02/2010  . COUGH, CHRONIC 04/09/2010   pt denies  . FATIGUE 06/24/2009  . FIBROIDS, UTERUS 09/03/2010  . GERD 08/08/2007  . HYPERLIPIDEMIA 08/08/2007  . Irregular menstrual cycle 06/24/2009  . LEG CRAMPS 06/24/2009  . LEG PAIN, RIGHT 10/03/2010  . OBESITY 06/24/2009  . SLEEPLESSNESS 11/16/2007  . TINGLING 10/03/2010  . TREMOR 07/02/2010  . VITAMIN D DEFICIENCY 06/02/2010    PAST SURGICAL HISTORY: Past Surgical History:  Procedure Laterality Date  . ABDOMINAL HYSTERECTOMY  nov 2011   secondary infection  had adhesions  . BREAST REDUCTION SURGERY  09 20 12  . BREAST SURGERY    . CHOLECYSTECTOMY    . OOPHORECTOMY     left   salpingesctomy   . REDUCTION MAMMAPLASTY Bilateral 07/2011    FAMILY HISTORY: Family History  Problem Relation Age of Onset  . Diabetes Mother   . Hypertension Mother   . Diabetes Brother 71       2013, type 2  . Arthritis Unknown   . Hypertension Unknown   . Colon cancer Neg Hx     SOCIAL HISTORY: Social History   Socioeconomic History  . Marital status: Single    Spouse name: n/a  . Number of children: 0  .  Years of education: 62  . Highest education level: Not on file  Occupational History  . Occupation: NUCLEAR MEDICINE    Employer: [employer]    Comment: [employer]  Social Needs  . Financial resource strain: Not on file  . Food insecurity:    Worry: Not on file    Inability: Not on file  . Transportation needs:    Medical: Not on file    Non-medical: Not on file  Tobacco Use  . Smoking status: Never Smoker  . Smokeless tobacco: Never Used  Substance and Sexual Activity  . Alcohol use: Yes    Alcohol/week: 0.0 - 0.6 oz    Comment: rarely  . Drug use: No  . Sexual  activity: Never    Birth control/protection: None  Lifestyle  . Physical activity:    Days per week: Not on file    Minutes per session: Not on file  . Stress: Not on file  Relationships  . Social connections:    Talks on phone: Not on file    Gets together: Not on file    Attends religious service: Not on file    Active member of club or organization: Not on file    Attends meetings of clubs or organizations: Not on file    Relationship status: Not on file  . Intimate partner violence:    Fear of current or ex partner: Not on file    Emotionally abused: Not on file    Physically abused: Not on file    Forced sexual activity: Not on file  Other Topics Concern  . Not on file  Social History Narrative   Lives alone, with 1 dog.   Cousins live nearby.  Siblings are in middle [place].   Neg td  ocass etoh 1 caffeine per day    40 hours  Per week.    Patient is right-handed.   Patient has a Haematologist.           PHYSICAL EXAM  Vitals:   02/14/18 0816  BP: 124/77  Pulse: 90  Weight: 184 lb 12.8 oz (83.8 kg)  Height: 5\' 3"  (1.6 m)   Body mass index is 32.74 kg/m.  Generalized: Well developed, Obese female in no acute distress  Head: normocephalic and atraumatic,. Oropharynx benign  Neck: Supple,  Musculoskeletal: No deformity   Neurological examination   Mentation: Alert oriented to time, place, history taking. Attention span and concentration appropriate. Recent and remote memory intact.  Follows all commands speech and language fluent. ESS 3  Cranial nerve II-XII: Pupils were equal round reactive to light extraocular movements were full, visual field were full on confrontational test. Facial sensation and strength were normal. hearing was intact to finger rubbing bilaterally. Uvula tongue midline. head turning and shoulder shrug were normal and symmetric.Tongue protrusion into cheek strength was normal. Motor: normal bulk and tone, full strength in the BUE,  BLE, fine finger movements normal, no pronator drift. No focal weakness Sensory: normal and symmetric to light touch, pinprick, and  Vibration, in the upper and lower extremities Coordination: finger-nose-finger, heel-to-shin bilaterally, no dysmetria Reflexes: Symmetric upper and lower, plantar responses were flexor bilaterally. Gait and Station: Rising up from seated position without assistance, normal stance,  moderate stride, good arm swing, smooth turning, able to perform tiptoe, and heel walking without difficulty. Tandem gait is steady  DIAGNOSTIC DATA (LABS, IMAGING, TESTING) - I reviewed patient records, labs, notes, testing and imaging myself where  available.  Lab Results  Component Value Date   WBC 10.6 (H) 12/27/2017   HGB 15.8 (H) 12/27/2017   HCT 46.1 (H) 12/27/2017   MCV 88.5 12/27/2017   PLT 428.0 (H) 12/27/2017      Component Value Date/Time   NA 141 12/27/2017 1422   K 4.1 12/27/2017 1422   CL 103 12/27/2017 1422   CO2 28 12/27/2017 1422   GLUCOSE 116 (H) 12/27/2017 1422   BUN 15 12/27/2017 1422   CREATININE 0.85 12/27/2017 1422   CALCIUM 10.4 12/27/2017 1422   PROT 7.2 12/27/2017 1422   ALBUMIN 4.6 12/27/2017 1422   AST 18 12/27/2017 1422   ALT 21 12/27/2017 1422   ALKPHOS 84 12/27/2017 1422   BILITOT 0.3 12/27/2017 1422   GFRNONAA 100.56 10/03/2010 0000   Lab Results  Component Value Date   CHOL 227 (H) 12/27/2017   HDL 57.50 12/27/2017   LDLCALC 152 (H) 12/27/2017   LDLDIRECT 135.4 06/27/2012   TRIG 84.0 12/27/2017   CHOLHDL 4 12/27/2017   Lab Results  Component Value Date   HGBA1C 6.1 12/27/2017    Lab Results  Component Value Date   TSH 1.68 12/27/2017      ASSESSMENT AND PLAN  52 y.o. year old female  has a past medical history of ; ADHD (attention deficit hyperactivity disorder); ANEMIA, IRON DEFICIENCY (07/02/2010); and insomnia here to follow-up. The patient is a current patient of Dr. Brett Fairy who is out of the office today . This  note is sent to the work in doctor.      PLAN: Continue Ambien at current dose  Follow up yearly and prn for ambien refills I spent 15 minutes in total face to face time with the patient more than 50% of which was spent counseling and coordination of care, reviewing test results reviewing medications and discussing and reviewing the diagnosis of insomnia and answering the patient's questions. She was advised that chronic use of sleep agents can lead to psychological addiction and Ambien can also cause sleep eating spells and sleep walking spells which can be dangerous Dennie Bible, [facility], [facility], APRN  [facility] [address] [place], [place] [address] [phone]

## 2018-02-14 ENCOUNTER — Ambulatory Visit (INDEPENDENT_AMBULATORY_CARE_PROVIDER_SITE_OTHER): Payer: 59 | Admitting: Nurse Practitioner

## 2018-02-14 ENCOUNTER — Encounter: Payer: Self-pay | Admitting: Nurse Practitioner

## 2018-02-14 VITALS — BP 124/77 | HR 90 | Ht 63.0 in | Wt 184.8 lb

## 2018-02-14 DIAGNOSIS — F5104 Psychophysiologic insomnia: Secondary | ICD-10-CM

## 2018-02-14 MED ORDER — ZOLPIDEM TARTRATE ER 12.5 MG PO TBCR: 12.5000 mg | EXTENDED_RELEASE_TABLET | Freq: Every evening | ORAL | 1 refills | Status: DC | PRN

## 2018-02-14 NOTE — Patient Instructions (Signed)
Continue Ambien at current dose will refill Follow up yearly and prn for ambien refills

## 2018-02-14 NOTE — Progress Notes (Signed)
Fax confirmation received for [facility] [phone].  sy

## 2018-02-16 NOTE — Progress Notes (Signed)
I have reviewed and agreed above plan. 

## 2018-02-25 ENCOUNTER — Encounter: Payer: Self-pay | Admitting: Internal Medicine

## 2018-02-28 DIAGNOSIS — F4323 Adjustment disorder with mixed anxiety and depressed mood: Secondary | ICD-10-CM | POA: Diagnosis not present

## 2018-04-11 DIAGNOSIS — M79644 Pain in right finger(s): Secondary | ICD-10-CM | POA: Diagnosis not present

## 2018-04-11 DIAGNOSIS — M67431 Ganglion, right wrist: Secondary | ICD-10-CM | POA: Diagnosis not present

## 2018-04-11 DIAGNOSIS — M25531 Pain in right wrist: Secondary | ICD-10-CM | POA: Diagnosis not present

## 2018-04-11 DIAGNOSIS — Z79899 Other long term (current) drug therapy: Secondary | ICD-10-CM | POA: Diagnosis not present

## 2018-04-11 DIAGNOSIS — M65331 Trigger finger, right middle finger: Secondary | ICD-10-CM | POA: Diagnosis not present

## 2018-04-11 DIAGNOSIS — F902 Attention-deficit hyperactivity disorder, combined type: Secondary | ICD-10-CM | POA: Diagnosis not present

## 2018-05-09 DIAGNOSIS — M25831 Other specified joint disorders, right wrist: Secondary | ICD-10-CM | POA: Diagnosis not present

## 2018-05-09 DIAGNOSIS — M65331 Trigger finger, right middle finger: Secondary | ICD-10-CM | POA: Diagnosis not present

## 2018-07-25 DIAGNOSIS — Z79899 Other long term (current) drug therapy: Secondary | ICD-10-CM | POA: Diagnosis not present

## 2018-07-25 DIAGNOSIS — F902 Attention-deficit hyperactivity disorder, combined type: Secondary | ICD-10-CM | POA: Diagnosis not present

## 2018-07-25 DIAGNOSIS — F4010 Social phobia, unspecified: Secondary | ICD-10-CM | POA: Diagnosis not present

## 2018-07-28 DIAGNOSIS — M222X1 Patellofemoral disorders, right knee: Secondary | ICD-10-CM | POA: Diagnosis not present

## 2018-07-28 DIAGNOSIS — M1711 Unilateral primary osteoarthritis, right knee: Secondary | ICD-10-CM | POA: Diagnosis not present

## 2018-07-28 DIAGNOSIS — M1712 Unilateral primary osteoarthritis, left knee: Secondary | ICD-10-CM | POA: Diagnosis not present

## 2018-07-28 DIAGNOSIS — M222X2 Patellofemoral disorders, left knee: Secondary | ICD-10-CM | POA: Diagnosis not present

## 2018-08-02 HISTORY — PX: TRIGGER FINGER RELEASE: SHX641

## 2018-08-09 DIAGNOSIS — D492 Neoplasm of unspecified behavior of bone, soft tissue, and skin: Secondary | ICD-10-CM | POA: Diagnosis not present

## 2018-08-09 DIAGNOSIS — M65331 Trigger finger, right middle finger: Secondary | ICD-10-CM | POA: Diagnosis not present

## 2018-08-09 DIAGNOSIS — M25531 Pain in right wrist: Secondary | ICD-10-CM | POA: Diagnosis not present

## 2018-08-12 ENCOUNTER — Encounter: Payer: Self-pay | Admitting: Physical Therapy

## 2018-08-12 ENCOUNTER — Other Ambulatory Visit: Payer: Self-pay

## 2018-08-12 ENCOUNTER — Ambulatory Visit: Payer: 59 | Attending: Orthopedic Surgery | Admitting: Physical Therapy

## 2018-08-12 DIAGNOSIS — M25562 Pain in left knee: Secondary | ICD-10-CM | POA: Diagnosis not present

## 2018-08-12 DIAGNOSIS — M25561 Pain in right knee: Secondary | ICD-10-CM | POA: Insufficient documentation

## 2018-08-12 DIAGNOSIS — M6281 Muscle weakness (generalized): Secondary | ICD-10-CM | POA: Insufficient documentation

## 2018-08-12 DIAGNOSIS — G8929 Other chronic pain: Secondary | ICD-10-CM | POA: Diagnosis not present

## 2018-08-12 NOTE — Therapy (Signed)
[facility] [address], [place], [place], [address] Phone: [phone]   Fax:  [phone]  Physical Therapy Evaluation  Patient Details  Name: Tiffany Carson MRN: [id number] Date of Birth: February 27, 1966 Referring Provider (PT): Gaynelle Arabian, MD   Encounter Date: 08/12/2018  PT End of Session - 08/12/18 1034    Visit Number  1    Date for PT Re-Evaluation  09/23/18    Authorization Type  [facility]    Authorization Time Period  08/12/18 - 09/23/18    PT Start Time  0935    PT Stop Time  1024    PT Time Calculation (min)  49 min    Equipment Utilized During Treatment  Other (comment)   Kinesio-Tex tape   Activity Tolerance  Patient tolerated treatment well    Behavior During Therapy  [facility] for tasks assessed/performed       Past Medical History:  Diagnosis Date  . ABDOMINAL PAIN RIGHT LOWER QUADRANT 10/03/2010  . Acquired absence of both cervix and uterus 10/03/2010  . ADHD (attention deficit hyperactivity disorder)    per dr Johnnye Sima  see notes  . ANEMIA, IRON DEFICIENCY 07/02/2010  . COUGH, CHRONIC 04/09/2010   pt denies  . FATIGUE 06/24/2009  . FIBROIDS, UTERUS 09/03/2010  . GERD 08/08/2007  . HYPERLIPIDEMIA 08/08/2007  . Irregular menstrual cycle 06/24/2009  . LEG CRAMPS 06/24/2009  . LEG PAIN, RIGHT 10/03/2010  . OBESITY 06/24/2009  . SLEEPLESSNESS 11/16/2007  . TINGLING 10/03/2010  . TREMOR 07/02/2010  . VITAMIN D DEFICIENCY 06/02/2010    Past Surgical History:  Procedure Laterality Date  . ABDOMINAL HYSTERECTOMY  nov 2011   secondary infection  had adhesions  . BREAST REDUCTION SURGERY  09 20 12  . BREAST SURGERY    . CHOLECYSTECTOMY    . OOPHORECTOMY     left   salpingesctomy   . REDUCTION MAMMAPLASTY Bilateral 07/2011    There were no vitals filed for this visit.   Subjective Assessment - 08/12/18 0937    Subjective  Pt traveled to [place] approx 1.5 months ago and climbed 1600 steps on a hike.  Had Lt  knee pain (lateral burning sensation) prior to trip which stayed the same, but the Rt knee swelled and developed pain on trip.  Upon returning home had to have 20ccs drained out of Rt knee.  Pt has been referred for alignment and strengthening of bil knees.    Limitations  House hold activities;Other (comment)   bending/squatting, going down stairs   How long can you sit comfortably?  n/a    How long can you stand comfortably?  n/a    How long can you walk comfortably?  n/a    Diagnostic tests  xrays bil knees, OA in both     Patient Stated Goals  knee alignment and strength, stairs with less pain, household tasks with less pain    Currently in Pain?  No/denies   can get to a 4/10 when Rt knee hurts   Multiple Pain Sites  No         [facility] PT Assessment - 08/12/18 0001      Assessment   Medical Diagnosis  M22.2X9 (ICD-10-CM) - Patellofemoral disorders, unspecified knee    Referring Provider (PT)  Gaynelle Arabian, MD    Onset Date/Surgical Date  07/03/18    Next MD Visit  09/15/18   might push it back since PT start was delayed   Prior Therapy  no      Precautions   Precaution Comments  avoid squatting/lunges      Balance Screen   Has the patient fallen in the past 6 months  No    Has the patient had a decrease in activity level because of a fear of falling?   No    Is the patient reluctant to leave their home because of a fear of falling?   No      Home Environment   Living Environment  Private residence    Living Arrangements  Alone    Type of [place] Access  Level entry    Home Layout  One level      Prior Function   Level of Independence  Independent    Vocation  Full time employment    Vocation Requirements  standing, sitting, help patients get up/down, in out of chair    Leisure  read, walk, friends, travel      Cognition   Overall Cognitive Status  Within Functional Limits for tasks assessed      Observation/Other Assessments   Observations   slight edema present Rt inferomedial aspect anterior knee   negative sweep test   Focus on Therapeutic Outcomes (FOTO)   32   goal 28     ROM / Strength   AROM / PROM / Strength  AROM;Strength      AROM   Overall AROM   Within functional limits for tasks performed    Overall AROM Comments  bil ankle, knee and hip WNL without pain   Pt noted occassional catching in Rt knee during ROM     Strength   Overall Strength  Within functional limits for tasks performed    Overall Strength Comments  5/5 throughout bil LEs   Rt VMO slight atrophy noted, dec use of bil VMO w/ quad set     Flexibility   Soft Tissue Assessment /Muscle Length  no   Pt with flexibility WNL in bil LEs     Palpation   Patella mobility  limited on Rt superior/inferior > medial tracking   Lt patellar mobility slightly limited infero-medial tracking     Special Tests    Special Tests  Knee Special Tests    Knee Special tests   --   Apley's test + on Rt with compression medial rotation     Functional Gait  Assessment   Gait assessed   Yes   no gait deficits noted               Objective measurements completed on examination: See above findings.      [facility] PT Treatment/Exercise - 08/12/18 0001      Manual Therapy   Manual Therapy  Taping    Kinesiotex  --   patella tracking on Rt, 50% pull on lateral tape, 10% medial            PT Education - 08/12/18 1023    Education Details  Access Code: [email or web address]    Person(s) Educated  Patient    Methods  Explanation;Demonstration;Verbal cues;Handout    Comprehension  Verbalized understanding;Returned demonstration       PT Short Term Goals - 08/12/18 1046      PT SHORT TERM GOAL #1   Title  Pt will be independing in initial HEP    Time  3    Period  Weeks    Status  New    Target  Date  09/02/18      PT SHORT TERM GOAL #2   Title  Pt will achieve Rt patellar mobility to within at least 50% of Lt patella.    Time  4    Period   Weeks    Status  New    Target Date  09/09/18      PT SHORT TERM GOAL #3   Title  Pt will report improved bil knee pain with household activities and stairs (up and down) by at least 30%.    Time  4    Period  Weeks    Status  New    Target Date  09/09/18        PT Long Term Goals - 08/12/18 1048      PT LONG TERM GOAL #1   Title  Pt will be independent in advanced HEP    Time  6    Period  Weeks    Status  New    Target Date  09/23/18      PT LONG TERM GOAL #2   Title  Pt will report improved bil knee pain by at least 50% during household tasks and stairs (up and down).    Time  6    Period  Weeks    Status  New    Target Date  09/23/18      PT LONG TERM GOAL #3   Title  Pt will achieve FOTO score of < or += 28% to demonstrate less restriction due to knee pain.    Time  6    Period  Weeks    Status  New    Target Date  09/23/18      PT LONG TERM GOAL #4   Title  Pt will demonstrate improved bil VMO recruitment and muscle bulk with quad set to allow for more normalized patellar tracking bil.    Time  6    Period  Weeks    Status  New    Target Date  09/23/18             Plan - 08/12/18 1037    Clinical Impression Statement  Pt reports to PT with bil knee pain following hiking/stair climbing while in [place] approx 1.5 months ago.  Pt had Rt knee drained of 20ccs of fluid when she returned from trip.  Longer history of Lt lateral knee burning which remained unchanged during travels.  Pain is worse with squatting and descending>ascending stairs.  She reports occassional catching in Rt knee which then clicks/pops and she can move again.  MD stated she may have a meniscus tear and to return if no improvement with PT.  She has excellent strength and flexibility in bil LEs but with noted atrophy and decreased use of VMO bil Rt>Lt.  Her Rt patella mobility is limited in sup/inf > med directions.  PT applied taping technique to Rt patella for more superior/medial tracking  today.  Pt will benefit from skilled PT to address deficits in bil quad strength, patella mobility and tracking, and functional movement patterns to protect knees.    History and Personal Factors relevant to plan of care:  per MD may have a Rt meniscus tear, avoid squats and lunges    Clinical Presentation  Stable    Clinical Decision Making  Low    Rehab Potential  Excellent    PT Frequency  2x / week    PT Duration  6 weeks  PT Treatment/Interventions  Electrical Stimulation;Cryotherapy;ADLs/Self Care Home Management;Vasopneumatic Device;Taping;Dry needling;Manual techniques;Joint Manipulations;Balance training;Therapeutic exercise;Therapeutic activities;Functional mobility training;Stair training;Moist Heat;Iontophoresis 4mg /ml Dexamethasone    PT Next Visit Plan  f/u on Rt patellar taping, HEP, continue patellar mobs and ther ex for VMO and to promote tracking    PT Home Exercise Plan  Access Code: [email or web address]    Consulted and Agree with Plan of Care  Patient       Patient will benefit from skilled therapeutic intervention in order to improve the following deficits and impairments:  Pain, Decreased mobility, Increased edema, Decreased strength, Decreased activity tolerance  Visit Diagnosis: Acute pain of right knee - Plan: PT plan of care cert/re-cert  Chronic pain of left knee - Plan: PT plan of care cert/re-cert  Muscle weakness (generalized) - Plan: PT plan of care cert/re-cert     Problem List Patient Active Problem List   Diagnosis Date Noted  . Varicose veins of left lower extremity with complications 24/26/8341  . Dystonia, torsion, fragments of 06/17/2015  . Insomnia due to mental condition 06/17/2015  . Obesity 06/17/2015  . Akinetic rigid syndrome 12/26/2014  . Writer's cramp 12/26/2014  . Snoring 04/09/2014  . Swelling of joint of right knee 03/30/2014  . Chronic insomnia 02/19/2014  . Primary snoring 02/19/2014  . Focal dystonia 02/19/2014  . Task-specific  dystonia of hand 02/19/2014  . Hand pain, right 12/04/2013  . Visit for preventive health examination 01/09/2013  . Medial knee pain 11/30/2012  . Gynecomastia, female 02/20/2011  . Neck pain 02/20/2011  . ACQUIRED ABSENCE OF BOTH CERVIX AND UTERUS 10/03/2010  . TREMOR 07/02/2010  . VITAMIN D DEFICIENCY 06/02/2010  . OBESITY 06/24/2009  . SLEEPLESSNESS 11/16/2007  . GERD 08/08/2007   Baruch Merl, PT 08/12/18 10:59 AM   [facility] [address] [place], [place], [address] Phone: [phone]   Fax:  [phone]  Name: SAYANA SALLEY MRN: [id number] Date of Birth: 1965-12-09

## 2018-08-12 NOTE — Patient Instructions (Signed)
Access Code: [email or web address]  URL: [email or web address]  Date: 08/12/2018  Prepared by: Venetia Night Beuhring   Exercises  Supine Bridge with [employer] Between Knees - 10 reps - 3 sets - 1x daily - 7x weekly  Seated Hip Adduction Squeeze with Ball - 10 reps - 3 sets - 1x daily - 7x weekly  Sit to Stand - 10 reps - 1 sets - 1x daily - 7x weekly  Seated Long Arc Quad with Hip Adduction - 10 reps - 3 sets - 1x daily - 7x weekly

## 2018-08-15 ENCOUNTER — Ambulatory Visit: Payer: 59 | Admitting: Physical Therapy

## 2018-08-15 DIAGNOSIS — M25562 Pain in left knee: Secondary | ICD-10-CM | POA: Diagnosis not present

## 2018-08-15 DIAGNOSIS — M6281 Muscle weakness (generalized): Secondary | ICD-10-CM | POA: Diagnosis not present

## 2018-08-15 DIAGNOSIS — G8929 Other chronic pain: Secondary | ICD-10-CM | POA: Diagnosis not present

## 2018-08-15 DIAGNOSIS — M25561 Pain in right knee: Secondary | ICD-10-CM | POA: Diagnosis not present

## 2018-08-15 NOTE — Patient Instructions (Addendum)
Quadriceps (Prone)    On stomach with sheet around ankles, knees together, hips down, pull heels toward bottom. Keep hips flat. Hold __60__ seconds. Repeat __2__ times. Do _2___ sessions per day. CAUTION: Stretch should be gentle, steady and slow.  ABDUCTION: Side-Lying (Active)   Lie on left side, top leg straight. Raise top leg as far as possible keeping leg in line with your body. Complete 1-3__ sets of _10__ repetitions. Perform _2-3__ sessions per day.   Hip Stretch  Put right ankle over left knee. Let right knee fall downward, but keep ankle in place. Feel the stretch in hip. May push down gently with hand to feel stretch. Hold __60__ seconds while counting out loud. Repeat with other leg. Repeat _2___ times. Do __2__ sessions per day.   Tiffany Carson, PT 08/15/18 10:11 AM [facility] [address] [place], [place], [address] Phone: [phone]   Fax:  [phone]

## 2018-08-15 NOTE — Therapy (Signed)
[facility] [address] [place], [place], [address] Phone: [phone]   Fax:  [phone]  Physical Therapy Treatment  Patient Details  Name: Tiffany Carson MRN: [id number] Date of Birth: Sep 30, 1966 Referring Provider (PT): Gaynelle Arabian, MD   Encounter Date: 08/15/2018  PT End of Session - 08/15/18 0934    Visit Number  2    Date for PT Re-Evaluation  09/23/18    Authorization Type  UMR    Authorization Time Period  08/12/18 - 09/23/18    PT Start Time  0934    PT Stop Time  1015    PT Time Calculation (min)  41 min    Activity Tolerance  Patient tolerated treatment well    Behavior During Therapy  [facility] for tasks assessed/performed       Past Medical History:  Diagnosis Date  . ABDOMINAL PAIN RIGHT LOWER QUADRANT 10/03/2010  . Acquired absence of both cervix and uterus 10/03/2010  . ADHD (attention deficit hyperactivity disorder)    per dr Johnnye Sima  see notes  . ANEMIA, IRON DEFICIENCY 07/02/2010  . COUGH, CHRONIC 04/09/2010   pt denies  . FATIGUE 06/24/2009  . FIBROIDS, UTERUS 09/03/2010  . GERD 08/08/2007  . HYPERLIPIDEMIA 08/08/2007  . Irregular menstrual cycle 06/24/2009  . LEG CRAMPS 06/24/2009  . LEG PAIN, RIGHT 10/03/2010  . OBESITY 06/24/2009  . SLEEPLESSNESS 11/16/2007  . TINGLING 10/03/2010  . TREMOR 07/02/2010  . VITAMIN D DEFICIENCY 06/02/2010    Past Surgical History:  Procedure Laterality Date  . ABDOMINAL HYSTERECTOMY  nov 2011   secondary infection  had adhesions  . BREAST REDUCTION SURGERY  09 20 12  . BREAST SURGERY    . CHOLECYSTECTOMY    . OOPHORECTOMY     left   salpingesctomy   . REDUCTION MAMMAPLASTY Bilateral 07/2011    There were no vitals filed for this visit.  Subjective Assessment - 08/15/18 0934    Subjective  Patient states the tape helped some. She did not try any stairs or inclines where she would normally have pain.    Patient Stated Goals  knee alignment and  strength, stairs with less pain, household tasks with less pain    Currently in Pain?  No/denies         [facility] PT Assessment - 08/15/18 0001      Functional Tests   Functional tests  Squat;Lunges;Step down;Single Leg Squat      Squat   Comments  even      Lunges   Comments  bil hip weakness      Step Down   Comments  eccentric quad weakness R>L      Single Leg Squat   Comments  bil hip weakness                   [facility] Adult PT Treatment/Exercise - 08/15/18 0001      Exercises   Exercises  Knee/Hip      Knee/Hip Exercises: Stretches   Quad Stretch  Right;1 rep;60 seconds    Quad Stretch Limitations  prone   SDLY demo'd and perfomed also     Knee/Hip Exercises: Aerobic   Recumbent Bike  L2 x 5 min      Knee/Hip Exercises: Standing   Other Standing Knee Exercises  eccentric quad heel taps 2 inch step 3x10 bil      Knee/Hip Exercises: Seated   Long Arc Quad  Strengthening;10 reps;Right;Left;4 sets   w/  ball 5 sec hold; last two sets with hip ER no ball     Knee/Hip Exercises: Supine   Bridges with Ball Squeeze  Strengthening;Both;10 reps;3 sets   10 sec hold with full bridge; pt only releases part way   Straight Leg Raise with External Rotation  Strengthening;Both;2 sets;10 reps    Straight Leg Raise with External Rotation Limitations  in long sitting      Knee/Hip Exercises: Sidelying   Hip ABduction  Strengthening;Both;2 sets;10 reps             PT Education - 08/15/18 1000    Education Details  HEP    Person(s) Educated  Patient    Methods  Explanation;Demonstration;Handout    Comprehension  Verbalized understanding;Returned demonstration       PT Short Term Goals - 08/12/18 1046      PT SHORT TERM GOAL #1   Title  Pt will be independing in initial HEP    Time  3    Period  Weeks    Status  New    Target Date  09/02/18      PT SHORT TERM GOAL #2   Title  Pt will achieve Rt patellar mobility to within at least 50% of Lt patella.     Time  4    Period  Weeks    Status  New    Target Date  09/09/18      PT SHORT TERM GOAL #3   Title  Pt will report improved bil knee pain with household activities and stairs (up and down) by at least 30%.    Time  4    Period  Weeks    Status  New    Target Date  09/09/18        PT Long Term Goals - 08/12/18 1048      PT LONG TERM GOAL #1   Title  Pt will be independent in advanced HEP    Time  6    Period  Weeks    Status  New    Target Date  09/23/18      PT LONG TERM GOAL #2   Title  Pt will report improved bil knee pain by at least 50% during household tasks and stairs (up and down).    Time  6    Period  Weeks    Status  New    Target Date  09/23/18      PT LONG TERM GOAL #3   Title  Pt will achieve FOTO score of < or += 28% to demonstrate less restriction due to knee pain.    Time  6    Period  Weeks    Status  New    Target Date  09/23/18      PT LONG TERM GOAL #4   Title  Pt will demonstrate improved bil VMO recruitment and muscle bulk with quad set to allow for more normalized patellar tracking bil.    Time  6    Period  Weeks    Status  New    Target Date  09/23/18            Plan - 08/15/18 1039    Clinical Impression Statement  Patient did very well with TE today. She reported no pain with exercise so we held taping. She demo'd eccentric weakness in the R quad with heel taps and descending stairs and hip weakness with SL squat and lunges so started strengthening here.  No goals met as only second visit.    Rehab Potential  Excellent    PT Frequency  2x / week    PT Duration  6 weeks    PT Treatment/Interventions  Electrical Stimulation;Cryotherapy;ADLs/Self Care Home Management;Vasopneumatic Device;Taping;Dry needling;Manual techniques;Joint Manipulations;Balance training;Therapeutic exercise;Therapeutic activities;Functional mobility training;Stair training;Moist Heat;Iontophoresis 4m/ml Dexamethasone    PT Next Visit Plan  f/u on Rt  patellar taping, HEP, continue patellar mobs and ther ex for VMO and to promote tracking    PT Home Exercise Plan  Access Code: [email or web address]    Consulted and Agree with Plan of Care  Patient       Patient will benefit from skilled therapeutic intervention in order to improve the following deficits and impairments:  Pain, Decreased mobility, Increased edema, Decreased strength, Decreased activity tolerance  Visit Diagnosis: Acute pain of right knee  Chronic pain of left knee  Muscle weakness (generalized)     Problem List Patient Active Problem List   Diagnosis Date Noted  . Varicose veins of left lower extremity with complications 031/59/4585 . Dystonia, torsion, fragments of 06/17/2015  . Insomnia due to mental condition 06/17/2015  . Obesity 06/17/2015  . Akinetic rigid syndrome 12/26/2014  . Writer's cramp 12/26/2014  . Snoring 04/09/2014  . Swelling of joint of right knee 03/30/2014  . Chronic insomnia 02/19/2014  . Primary snoring 02/19/2014  . Focal dystonia 02/19/2014  . Task-specific dystonia of hand 02/19/2014  . Hand pain, right 12/04/2013  . Visit for preventive health examination 01/09/2013  . Medial knee pain 11/30/2012  . Gynecomastia, female 02/20/2011  . Neck pain 02/20/2011  . ACQUIRED ABSENCE OF BOTH CERVIX AND UTERUS 10/03/2010  . TREMOR 07/02/2010  . VITAMIN D DEFICIENCY 06/02/2010  . OBESITY 06/24/2009  . SLEEPLESSNESS 11/16/2007  . GERD 08/08/2007    Reginaldo Hazard PT 08/15/2018, 10:59 AM  [facility] [address] [place] [address]: [phone]  Fax:  [phone] Name: Tiffany HULTSMRN: [id number] of Birth: 703-27-1967

## 2018-08-22 DIAGNOSIS — Z5189 Encounter for other specified aftercare: Secondary | ICD-10-CM | POA: Diagnosis not present

## 2018-08-22 DIAGNOSIS — M65331 Trigger finger, right middle finger: Secondary | ICD-10-CM | POA: Diagnosis not present

## 2018-08-22 DIAGNOSIS — M25831 Other specified joint disorders, right wrist: Secondary | ICD-10-CM | POA: Diagnosis not present

## 2018-08-25 ENCOUNTER — Other Ambulatory Visit: Payer: Self-pay | Admitting: Nurse Practitioner

## 2018-08-25 ENCOUNTER — Ambulatory Visit: Payer: 59

## 2018-08-25 DIAGNOSIS — F5104 Psychophysiologic insomnia: Secondary | ICD-10-CM

## 2018-08-25 MED ORDER — ZOLPIDEM TARTRATE ER 12.5 MG PO TBCR: 12.5000 mg | EXTENDED_RELEASE_TABLET | Freq: Every evening | ORAL | 1 refills | Status: DC | PRN

## 2018-08-25 NOTE — Telephone Encounter (Signed)
[facility] Database Verified  Last filled 05/27/18

## 2018-08-25 NOTE — Addendum Note (Signed)
Addended by: Thomes Cake on: 08/25/2018 03:38 PM   Modules accepted: Orders

## 2018-08-25 NOTE — Telephone Encounter (Signed)
Patient calling states she is out of zolpidem (AMBIEN CR) 12.5 MG CR tablet . Please call into .  [facility]  .

## 2018-08-25 NOTE — Addendum Note (Signed)
Addended by: Otilio Jefferson on: 08/25/2018 03:54 PM   Modules accepted: Orders

## 2018-08-26 ENCOUNTER — Ambulatory Visit: Payer: 59 | Admitting: Physical Therapy

## 2018-08-26 ENCOUNTER — Encounter: Payer: Self-pay | Admitting: Physical Therapy

## 2018-08-26 DIAGNOSIS — M6281 Muscle weakness (generalized): Secondary | ICD-10-CM | POA: Diagnosis not present

## 2018-08-26 DIAGNOSIS — G8929 Other chronic pain: Secondary | ICD-10-CM | POA: Diagnosis not present

## 2018-08-26 DIAGNOSIS — M25561 Pain in right knee: Secondary | ICD-10-CM | POA: Diagnosis not present

## 2018-08-26 DIAGNOSIS — M25562 Pain in left knee: Secondary | ICD-10-CM | POA: Diagnosis not present

## 2018-08-26 NOTE — Therapy (Signed)
[facility] [address], [place], [place], [address] Phone: [phone]   Fax:  [phone]  Physical Therapy Treatment  Patient Details  Name: Tiffany Carson MRN: [id number] Date of Birth: 1966/10/02 Referring Provider (PT): Gaynelle Arabian, MD   Encounter Date: 08/26/2018  PT End of Session - 08/26/18 1021    Visit Number  3    Date for PT Re-Evaluation  09/23/18    PT Start Time  1019    PT Stop Time  1059    PT Time Calculation (min)  40 min    Activity Tolerance  Patient tolerated treatment well    Behavior During Therapy  [facility] for tasks assessed/performed       Past Medical History:  Diagnosis Date  . ABDOMINAL PAIN RIGHT LOWER QUADRANT 10/03/2010  . Acquired absence of both cervix and uterus 10/03/2010  . ADHD (attention deficit hyperactivity disorder)    per dr Johnnye Sima  see notes  . ANEMIA, IRON DEFICIENCY 07/02/2010  . COUGH, CHRONIC 04/09/2010   pt denies  . FATIGUE 06/24/2009  . FIBROIDS, UTERUS 09/03/2010  . GERD 08/08/2007  . HYPERLIPIDEMIA 08/08/2007  . Irregular menstrual cycle 06/24/2009  . LEG CRAMPS 06/24/2009  . LEG PAIN, RIGHT 10/03/2010  . OBESITY 06/24/2009  . SLEEPLESSNESS 11/16/2007  . TINGLING 10/03/2010  . TREMOR 07/02/2010  . VITAMIN D DEFICIENCY 06/02/2010    Past Surgical History:  Procedure Laterality Date  . ABDOMINAL HYSTERECTOMY  nov 2011   secondary infection  had adhesions  . BREAST REDUCTION SURGERY  09 20 12  . BREAST SURGERY    . CHOLECYSTECTOMY    . OOPHORECTOMY     left   salpingesctomy   . REDUCTION MAMMAPLASTY Bilateral 07/2011    There were no vitals filed for this visit.  Subjective Assessment - 08/26/18 1021    Subjective  No pain currently.  Pt has been able to do initial HEP without pain.  Pt still having pain with stairs especially going down.    Currently in Pain?  No/denies                       [facility] Adult PT Treatment/Exercise -  08/26/18 0001      Exercises   Exercises  Knee/Hip      Knee/Hip Exercises: Stretches   Active Hamstring Stretch  Right;Left;2 reps;20 seconds    Quad Stretch  Right;Left;2 reps;20 seconds    Gastroc Stretch  Right;Left;2 reps;20 seconds      Knee/Hip Exercises: Aerobic   Nustep  L1x3 min; L2x 3      Knee/Hip Exercises: Machines for Strengthening   Cybex Leg Press  bilat 60# 20x; single 30# 20x    seat 5; cues for eccentric control     Knee/Hip Exercises: Standing   Forward Step Up  2 sets;10 reps;Right;Left;Step Height: 6"    Step Down  Right;Left;2 sets;10 reps;Hand Hold: 1;Step Height: 4"   not coming all the way down   Other Standing Knee Exercises  side step and monster walk fwd/back with red band - 2 laps each      Knee/Hip Exercises: Supine   Short Arc Quad Sets  Strengthening;Right;Left;15 reps    Short Arc Quad Sets Limitations  4lb with eccentric lower     Bridges  Strengthening;Both;20 reps               PT Short Term Goals - 08/26/18 1210  PT SHORT TERM GOAL #1   Title  Pt will be independing in initial HEP    Status  Achieved        PT Long Term Goals - 08/12/18 1048      PT LONG TERM GOAL #1   Title  Pt will be independent in advanced HEP    Time  6    Period  Weeks    Status  New    Target Date  09/23/18      PT LONG TERM GOAL #2   Title  Pt will report improved bil knee pain by at least 50% during household tasks and stairs (up and down).    Time  6    Period  Weeks    Status  New    Target Date  09/23/18      PT LONG TERM GOAL #3   Title  Pt will achieve FOTO score of < or += 28% to demonstrate less restriction due to knee pain.    Time  6    Period  Weeks    Status  New    Target Date  09/23/18      PT LONG TERM GOAL #4   Title  Pt will demonstrate improved bil VMO recruitment and muscle bulk with quad set to allow for more normalized patellar tracking bil.    Time  6    Period  Weeks    Status  New    Target Date   09/23/18            Plan - 08/26/18 1210    Clinical Impression Statement  Pt is doing well and overall feeling less pain. She reports going down steps is still the worst.  Pt is able to progress strengthening and was educated in doing eccentric step downs at home.  Pt is independent with initial HEP at this time.  Pt will benefit from LE and quad strengthening.    PT Treatment/Interventions  Electrical Stimulation;Cryotherapy;ADLs/Self Care Home Management;Vasopneumatic Device;Taping;Dry needling;Manual techniques;Joint Manipulations;Balance training;Therapeutic exercise;Therapeutic activities;Functional mobility training;Stair training;Moist Heat;Iontophoresis 4mg /ml Dexamethasone    PT Next Visit Plan  bilateral quad and hip strength, single leg and eccentric exercises, progress as tolerated    PT Home Exercise Plan  Access Code: [email or web address]    Recommended Other Services  cert signed    Consulted and Agree with Plan of Care  Patient       Patient will benefit from skilled therapeutic intervention in order to improve the following deficits and impairments:  Pain, Decreased mobility, Increased edema, Decreased strength, Decreased activity tolerance  Visit Diagnosis: Acute pain of right knee  Chronic pain of left knee  Muscle weakness (generalized)     Problem List Patient Active Problem List   Diagnosis Date Noted  . Varicose veins of left lower extremity with complications 24/23/5361  . Dystonia, torsion, fragments of 06/17/2015  . Insomnia due to mental condition 06/17/2015  . Obesity 06/17/2015  . Akinetic rigid syndrome 12/26/2014  . Writer's cramp 12/26/2014  . Snoring 04/09/2014  . Swelling of joint of right knee 03/30/2014  . Chronic insomnia 02/19/2014  . Primary snoring 02/19/2014  . Focal dystonia 02/19/2014  . Task-specific dystonia of hand 02/19/2014  . Hand pain, right 12/04/2013  . Visit for preventive health examination 01/09/2013  . Medial knee pain  11/30/2012  . Gynecomastia, female 02/20/2011  . Neck pain 02/20/2011  . ACQUIRED ABSENCE OF BOTH CERVIX AND UTERUS 10/03/2010  . TREMOR 07/02/2010  .  VITAMIN D DEFICIENCY 06/02/2010  . OBESITY 06/24/2009  . SLEEPLESSNESS 11/16/2007  . GERD 08/08/2007    Zannie Cove, PT 08/26/2018, 12:14 PM  [facility] [address], [place], [place], [address] Phone: [phone]   Fax:  [phone]  Name: Tiffany Carson MRN: [id number] Date of Birth: 07-02-1966

## 2018-08-29 ENCOUNTER — Ambulatory Visit: Payer: 59 | Admitting: Physical Therapy

## 2018-08-29 DIAGNOSIS — M25531 Pain in right wrist: Secondary | ICD-10-CM | POA: Diagnosis not present

## 2018-08-31 ENCOUNTER — Ambulatory Visit: Payer: 59

## 2018-08-31 DIAGNOSIS — M25531 Pain in right wrist: Secondary | ICD-10-CM | POA: Diagnosis not present

## 2018-09-05 ENCOUNTER — Ambulatory Visit: Payer: 59 | Attending: Orthopedic Surgery

## 2018-09-05 DIAGNOSIS — G8929 Other chronic pain: Secondary | ICD-10-CM | POA: Diagnosis not present

## 2018-09-05 DIAGNOSIS — M6281 Muscle weakness (generalized): Secondary | ICD-10-CM | POA: Insufficient documentation

## 2018-09-05 DIAGNOSIS — M25531 Pain in right wrist: Secondary | ICD-10-CM | POA: Diagnosis not present

## 2018-09-05 DIAGNOSIS — M25562 Pain in left knee: Secondary | ICD-10-CM | POA: Diagnosis not present

## 2018-09-05 DIAGNOSIS — M25561 Pain in right knee: Secondary | ICD-10-CM | POA: Insufficient documentation

## 2018-09-05 NOTE — Therapy (Signed)
[facility] [address] [place], [place], [address] Phone: [phone]   Fax:  [phone]  Physical Therapy Treatment  Patient Details  Name: Tiffany Carson MRN: [id number] Date of Birth: 1966/09/04 Referring Provider (PT): Gaynelle Arabian, MD   Encounter Date: 09/05/2018  PT End of Session - 09/05/18 0920    Visit Number  4    Date for PT Re-Evaluation  09/23/18    Authorization Type  UMR    PT Start Time  0847    PT Stop Time  0929    PT Time Calculation (min)  42 min    Activity Tolerance  Patient tolerated treatment well    Behavior During Therapy  [facility] for tasks assessed/performed       Past Medical History:  Diagnosis Date  . ABDOMINAL PAIN RIGHT LOWER QUADRANT 10/03/2010  . Acquired absence of both cervix and uterus 10/03/2010  . ADHD (attention deficit hyperactivity disorder)    per dr Johnnye Sima  see notes  . ANEMIA, IRON DEFICIENCY 07/02/2010  . COUGH, CHRONIC 04/09/2010   pt denies  . FATIGUE 06/24/2009  . FIBROIDS, UTERUS 09/03/2010  . GERD 08/08/2007  . HYPERLIPIDEMIA 08/08/2007  . Irregular menstrual cycle 06/24/2009  . LEG CRAMPS 06/24/2009  . LEG PAIN, RIGHT 10/03/2010  . OBESITY 06/24/2009  . SLEEPLESSNESS 11/16/2007  . TINGLING 10/03/2010  . TREMOR 07/02/2010  . VITAMIN D DEFICIENCY 06/02/2010    Past Surgical History:  Procedure Laterality Date  . ABDOMINAL HYSTERECTOMY  nov 2011   secondary infection  had adhesions  . BREAST REDUCTION SURGERY  09 20 12  . BREAST SURGERY    . CHOLECYSTECTOMY    . OOPHORECTOMY     left   salpingesctomy   . REDUCTION MAMMAPLASTY Bilateral 07/2011    There were no vitals filed for this visit.  Subjective Assessment - 09/05/18 0849    Subjective  I'm doing well.  Exercises are good.      Patient Stated Goals  knee alignment and strength, stairs with less pain, household tasks with less pain    Currently in Pain?  No/denies                        [facility] Adult PT Treatment/Exercise - 09/05/18 0001      Exercises   Exercises  Knee/Hip      Knee/Hip Exercises: Stretches   Active Hamstring Stretch  Right;Left;2 reps;20 seconds    Gastroc Stretch  Right;Left;2 reps;20 seconds   using rockerboard     Knee/Hip Exercises: Aerobic   Nustep  Level 2x 8 minutes      Knee/Hip Exercises: Machines for Strengthening   Cybex Leg Press  bilat 60# 20x; single 30# 20x    seat 5; cues for eccentric control.  Ball between knees     Knee/Hip Exercises: Standing   Forward Step Up  2 sets;10 reps;Right;Left;Step Height: 6"    Step Down  Right;Left;2 sets;10 reps;Hand Hold: 1;Step Height: 6"      Knee/Hip Exercises: Seated   Long Arc Quad  Strengthening;10 reps;Right;Left;4 sets   w/ ball 5 sec hold;    Long Arc Quad Weight  2 lbs.      Knee/Hip Exercises: Supine   Bridges  --               PT Short Term Goals - 09/05/18 0856      PT SHORT TERM GOAL #3   Title  Pt will report improved bil knee pain with household activities and stairs (up and down) by at least 30%.    Status  Achieved        PT Long Term Goals - 09/05/18 0857      PT LONG TERM GOAL #2   Title  Pt will report improved bil knee pain by at least 50% during household tasks and stairs (up and down).    Status  Achieved      PT LONG TERM GOAL #4   Title  Pt will demonstrate improved bil VMO recruitment and muscle bulk with quad set to allow for more normalized patellar tracking bil.    Time  6    Period  Weeks    Status  On-going            Plan - 09/05/18 0904    Clinical Impression Statement  Pt reports that pain is 100% improved since the start of care.  Pt with improved eccentric control of bil knees and this is still challenging at end range of step down.  Pt increased to 6" step today. Pt provided tactile cues and ball between legs for VMO recruitment.  Pt is independent and compliant in HEP.  Pt will continue to  benefit from skilled PT for bil hip and knee strength and flexibility to improve proprioception and eccentric control.     Rehab Potential  Excellent    PT Frequency  2x / week    PT Duration  6 weeks    PT Treatment/Interventions  Electrical Stimulation;Cryotherapy;ADLs/Self Care Home Management;Vasopneumatic Device;Taping;Dry needling;Manual techniques;Joint Manipulations;Balance training;Therapeutic exercise;Therapeutic activities;Functional mobility training;Stair training;Moist Heat;Iontophoresis 4mg /ml Dexamethasone    PT Next Visit Plan  bilateral quad and hip strength, single leg and eccentric exercises, progress as tolerated    PT Home Exercise Plan  Access Code: [email or web address]    Consulted and Agree with Plan of Care  Patient       Patient will benefit from skilled therapeutic intervention in order to improve the following deficits and impairments:  Pain, Decreased mobility, Increased edema, Decreased strength, Decreased activity tolerance  Visit Diagnosis: Acute pain of right knee  Chronic pain of left knee  Muscle weakness (generalized)     Problem List Patient Active Problem List   Diagnosis Date Noted  . Varicose veins of left lower extremity with complications 84/16/6063  . Dystonia, torsion, fragments of 06/17/2015  . Insomnia due to mental condition 06/17/2015  . Obesity 06/17/2015  . Akinetic rigid syndrome 12/26/2014  . Writer's cramp 12/26/2014  . Snoring 04/09/2014  . Swelling of joint of right knee 03/30/2014  . Chronic insomnia 02/19/2014  . Primary snoring 02/19/2014  . Focal dystonia 02/19/2014  . Task-specific dystonia of hand 02/19/2014  . Hand pain, right 12/04/2013  . Visit for preventive health examination 01/09/2013  . Medial knee pain 11/30/2012  . Gynecomastia, female 02/20/2011  . Neck pain 02/20/2011  . ACQUIRED ABSENCE OF BOTH CERVIX AND UTERUS 10/03/2010  . TREMOR 07/02/2010  . VITAMIN D DEFICIENCY 06/02/2010  . OBESITY 06/24/2009  .  SLEEPLESSNESS 11/16/2007  . GERD 08/08/2007   Sigurd Sos, PT 09/05/18 9:24 AM  [facility] [address] [place], [place], [address] Phone: [phone]   Fax:  [phone]  Name: Tiffany Carson MRN: [id number] Date of Birth: 1966-06-18

## 2018-09-07 ENCOUNTER — Ambulatory Visit: Payer: 59

## 2018-09-07 DIAGNOSIS — M25561 Pain in right knee: Secondary | ICD-10-CM | POA: Diagnosis not present

## 2018-09-07 DIAGNOSIS — G8929 Other chronic pain: Secondary | ICD-10-CM

## 2018-09-07 DIAGNOSIS — M25562 Pain in left knee: Secondary | ICD-10-CM

## 2018-09-07 DIAGNOSIS — M6281 Muscle weakness (generalized): Secondary | ICD-10-CM

## 2018-09-07 NOTE — Therapy (Signed)
[facility] [address] [place], [place], [address] Phone: [phone]   Fax:  [phone]  Physical Therapy Treatment  Patient Details  Name: Tiffany Carson MRN: [id number] Date of Birth: 01-16-1966 Referring Provider (PT): Gaynelle Arabian, MD   Encounter Date: 09/07/2018  PT End of Session - 09/07/18 1653    Visit Number  5    Date for PT Re-Evaluation  09/23/18    Authorization Type  UMR    PT Start Time  1618    PT Stop Time  1657    PT Time Calculation (min)  39 min    Activity Tolerance  Patient tolerated treatment well    Behavior During Therapy  [facility] for tasks assessed/performed       Past Medical History:  Diagnosis Date  . ABDOMINAL PAIN RIGHT LOWER QUADRANT 10/03/2010  . Acquired absence of both cervix and uterus 10/03/2010  . ADHD (attention deficit hyperactivity disorder)    per dr Johnnye Sima  see notes  . ANEMIA, IRON DEFICIENCY 07/02/2010  . COUGH, CHRONIC 04/09/2010   pt denies  . FATIGUE 06/24/2009  . FIBROIDS, UTERUS 09/03/2010  . GERD 08/08/2007  . HYPERLIPIDEMIA 08/08/2007  . Irregular menstrual cycle 06/24/2009  . LEG CRAMPS 06/24/2009  . LEG PAIN, RIGHT 10/03/2010  . OBESITY 06/24/2009  . SLEEPLESSNESS 11/16/2007  . TINGLING 10/03/2010  . TREMOR 07/02/2010  . VITAMIN D DEFICIENCY 06/02/2010    Past Surgical History:  Procedure Laterality Date  . ABDOMINAL HYSTERECTOMY  nov 2011   secondary infection  had adhesions  . BREAST REDUCTION SURGERY  09 20 12  . BREAST SURGERY    . CHOLECYSTECTOMY    . OOPHORECTOMY     left   salpingesctomy   . REDUCTION MAMMAPLASTY Bilateral 07/2011    There were no vitals filed for this visit.  Subjective Assessment - 09/07/18 1622    Subjective  I was feeling 100% better but today it has felt sore and has been popping some.    Currently in Pain?  Yes    Pain Score  1     Pain Location  Knee    Pain Orientation  Right    Pain Descriptors / Indicators   Tightness                       [facility] Adult PT Treatment/Exercise - 09/07/18 0001      Exercises   Exercises  Knee/Hip      Knee/Hip Exercises: Stretches   Active Hamstring Stretch  Right;Left;2 reps;20 seconds    Gastroc Stretch  Right;Left;2 reps;20 seconds   using rockerboard     Knee/Hip Exercises: Aerobic   Nustep  Level 2x 8 minutes      Knee/Hip Exercises: Machines for Strengthening   Cybex Leg Press  bilat 60# 20x; single 30# 20x    seat 5; cues for eccentric control.  Ball between knees     Knee/Hip Exercises: Standing   Forward Step Up  2 sets;10 reps;Right;Left;Step Height: 6"    Step Down  Right;Left;2 sets;10 reps;Hand Hold: 1;Step Height: 6"    Walking with Sports Cord  25# forward and reverse       Knee/Hip Exercises: Seated   Long Arc Quad  Strengthening;10 reps;Right;Left;4 sets   w/ ball 5 sec hold;    Long Arc Quad Weight  2 lbs.               PT Short  Term Goals - 09/05/18 0856      PT SHORT TERM GOAL #3   Title  Pt will report improved bil knee pain with household activities and stairs (up and down) by at least 30%.    Status  Achieved        PT Long Term Goals - 09/05/18 0857      PT LONG TERM GOAL #2   Title  Pt will report improved bil knee pain by at least 50% during household tasks and stairs (up and down).    Status  Achieved      PT LONG TERM GOAL #4   Title  Pt will demonstrate improved bil VMO recruitment and muscle bulk with quad set to allow for more normalized patellar tracking bil.    Time  6    Period  Weeks    Status  On-going            Plan - 09/07/18 1633    Clinical Impression Statement  Pt has had increased soreness (mild, rated 1/10) and some popping in the knees today.  Pt tolerates all exercise in the clinic with some challenge with end range eccentric control.  Pt was able to increase to 6" step-ups this week for step-ups and downs. Pt requires tactile cues for VMO recruitment.  Pt will  continue to benefit from skilled PT for knee strength, stability and focus on eccentric control.      Rehab Potential  Excellent    PT Frequency  2x / week    PT Duration  6 weeks    PT Treatment/Interventions  Electrical Stimulation;Cryotherapy;ADLs/Self Care Home Management;Vasopneumatic Device;Taping;Dry needling;Manual techniques;Joint Manipulations;Balance training;Therapeutic exercise;Therapeutic activities;Functional mobility training;Stair training;Moist Heat;Iontophoresis 4mg /ml Dexamethasone    PT Next Visit Plan  bilateral quad and hip strength, single leg and eccentric exercises, progress as tolerated    PT Home Exercise Plan  Access Code: [email or web address]    Consulted and Agree with Plan of Care  Patient       Patient will benefit from skilled therapeutic intervention in order to improve the following deficits and impairments:  Pain, Decreased mobility, Increased edema, Decreased strength, Decreased activity tolerance  Visit Diagnosis: Acute pain of right knee  Chronic pain of left knee  Muscle weakness (generalized)     Problem List Patient Active Problem List   Diagnosis Date Noted  . Varicose veins of left lower extremity with complications 16/08/9603  . Dystonia, torsion, fragments of 06/17/2015  . Insomnia due to mental condition 06/17/2015  . Obesity 06/17/2015  . Akinetic rigid syndrome 12/26/2014  . Writer's cramp 12/26/2014  . Snoring 04/09/2014  . Swelling of joint of right knee 03/30/2014  . Chronic insomnia 02/19/2014  . Primary snoring 02/19/2014  . Focal dystonia 02/19/2014  . Task-specific dystonia of hand 02/19/2014  . Hand pain, right 12/04/2013  . Visit for preventive health examination 01/09/2013  . Medial knee pain 11/30/2012  . Gynecomastia, female 02/20/2011  . Neck pain 02/20/2011  . ACQUIRED ABSENCE OF BOTH CERVIX AND UTERUS 10/03/2010  . TREMOR 07/02/2010  . VITAMIN D DEFICIENCY 06/02/2010  . OBESITY 06/24/2009  . SLEEPLESSNESS 11/16/2007   . GERD 08/08/2007    Sigurd Sos, PT 09/07/18 4:55 PM  [facility] [address], [place], [place], [address] Phone: [phone]   Fax:  [phone]  Name: Tiffany Carson MRN: [id number] Date of Birth: Nov 16, 1965

## 2018-09-08 DIAGNOSIS — M25531 Pain in right wrist: Secondary | ICD-10-CM | POA: Diagnosis not present

## 2018-09-12 ENCOUNTER — Ambulatory Visit: Payer: 59

## 2018-09-12 DIAGNOSIS — M6281 Muscle weakness (generalized): Secondary | ICD-10-CM | POA: Diagnosis not present

## 2018-09-12 DIAGNOSIS — M25561 Pain in right knee: Secondary | ICD-10-CM | POA: Diagnosis not present

## 2018-09-12 DIAGNOSIS — M25531 Pain in right wrist: Secondary | ICD-10-CM | POA: Diagnosis not present

## 2018-09-12 DIAGNOSIS — M25562 Pain in left knee: Principal | ICD-10-CM

## 2018-09-12 DIAGNOSIS — G8929 Other chronic pain: Secondary | ICD-10-CM

## 2018-09-12 NOTE — Therapy (Signed)
[facility] [address], [place], [place], [address] Phone: [phone]   Fax:  [phone]  Physical Therapy Treatment  Patient Details  Name: Tiffany Carson MRN: [id number] Date of Birth: May 07, 1966 Referring Provider (Tiffany Carson): Gaynelle Arabian, MD   Encounter Date: 09/12/2018  Tiffany Carson End of Session - 09/12/18 1140    Visit Number  6    Date for Tiffany Carson Re-Evaluation  09/23/18    Authorization Type  UMR    Tiffany Carson Start Time  1103    Tiffany Carson Stop Time  1144    Tiffany Carson Time Calculation (min)  41 min    Activity Tolerance  Patient tolerated treatment well    Behavior During Therapy  [facility] for tasks assessed/performed       Past Medical History:  Diagnosis Date  . ABDOMINAL PAIN RIGHT LOWER QUADRANT 10/03/2010  . Acquired absence of both cervix and uterus 10/03/2010  . ADHD (attention deficit hyperactivity disorder)    per dr Johnnye Sima  see notes  . ANEMIA, IRON DEFICIENCY 07/02/2010  . COUGH, CHRONIC 04/09/2010   Tiffany Carson denies  . FATIGUE 06/24/2009  . FIBROIDS, UTERUS 09/03/2010  . GERD 08/08/2007  . HYPERLIPIDEMIA 08/08/2007  . Irregular menstrual cycle 06/24/2009  . LEG CRAMPS 06/24/2009  . LEG PAIN, RIGHT 10/03/2010  . OBESITY 06/24/2009  . SLEEPLESSNESS 11/16/2007  . TINGLING 10/03/2010  . TREMOR 07/02/2010  . VITAMIN D DEFICIENCY 06/02/2010    Past Surgical History:  Procedure Laterality Date  . ABDOMINAL HYSTERECTOMY  nov 2011   secondary infection  had adhesions  . BREAST REDUCTION SURGERY  09 20 12  . BREAST SURGERY    . CHOLECYSTECTOMY    . OOPHORECTOMY     left   salpingesctomy   . REDUCTION MAMMAPLASTY Bilateral 07/2011    There were no vitals filed for this visit.  Subjective Assessment - 09/12/18 1104    Subjective  My knee feels sore when the weather changes.      Currently in Pain?  Yes    Pain Score  1     Pain Location  Knee    Pain Orientation  Right    Pain Descriptors / Indicators  Dull    Pain Type  Chronic pain     Pain Onset  More than a month ago    Pain Frequency  Intermittent    Aggravating Factors   with weather changes    Pain Relieving Factors  moving around                       [facility] Adult Tiffany Carson Treatment/Exercise - 09/12/18 0001      Knee/Hip Exercises: Stretches   Active Hamstring Stretch  Right;Left;2 reps;20 seconds    Gastroc Stretch  Right;Left;2 reps;20 seconds   using rockerboard     Knee/Hip Exercises: Aerobic   Nustep  Level 3x 8 minutes   Tiffany Carson present to discuss progress     Knee/Hip Exercises: Machines for Strengthening   Cybex Leg Press  bilat 70# 20x; single 35# 20x    seat 5; cues for eccentric control.  Ball between knees     Knee/Hip Exercises: Standing   Forward Step Up  2 sets;10 reps;Right;Left;Step Height: 8"    Walking with Sports Cord  25# forward and reverse x 10, sidestepping x 5 each      Knee/Hip Exercises: Seated   Long Arc Quad  Strengthening;10 reps;Right;Left;4 sets   w/ ball 5 sec hold;  Long Arc Quad Weight  2 lbs.               Tiffany Carson Short Term Goals - 09/05/18 0856      Tiffany Carson SHORT TERM GOAL #3   Title  Tiffany Carson will report improved bil knee pain with household activities and stairs (up and down) by at least 30%.    Status  Achieved        Tiffany Carson Long Term Goals - 09/05/18 0857      Tiffany Carson LONG TERM GOAL #2   Title  Tiffany Carson will report improved bil knee pain by at least 50% during household tasks and stairs (up and down).    Status  Achieved      Tiffany Carson LONG TERM GOAL #4   Title  Tiffany Carson will demonstrate improved bil VMO recruitment and muscle bulk with quad set to allow for more normalized patellar tracking bil.    Time  6    Period  Weeks    Status  On-going            Plan - 09/12/18 1118    Clinical Impression Statement  Tiffany Carson with some Rt knee soreness with cold weather changes.  Tiffany Carson with endurance and eccentric strength deficits on the Rt and requires verbal cues for control.  Tiffany Carson used ball between the knees for VMO activation.  Tiffany Carson  tolerated increased weight on the leg press today.  Tiffany Carson without significant pain that limits function at home or exercise in the clinic.  Tiffany Carson will continue to benefit from skilled Tiffany Carson for Rt knee strength, stability and endurance.        Rehab Potential  Excellent    Tiffany Carson Frequency  2x / week    Tiffany Carson Duration  6 weeks    Tiffany Carson Treatment/Interventions  Electrical Stimulation;Cryotherapy;ADLs/Self Care Home Management;Vasopneumatic Device;Taping;Dry needling;Manual techniques;Joint Manipulations;Balance training;Therapeutic exercise;Therapeutic activities;Functional mobility training;Stair training;Moist Heat;Iontophoresis 4mg /ml Dexamethasone    Tiffany Carson Next Visit Plan  bilateral quad and hip strength, single leg and eccentric exercises, progress as tolerated    Tiffany Carson Home Exercise Plan  Access Code: [email or web address]    Consulted and Agree with Plan of Care  Patient       Patient will benefit from skilled therapeutic intervention in order to improve the following deficits and impairments:  Pain, Decreased mobility, Increased edema, Decreased strength, Decreased activity tolerance  Visit Diagnosis: Chronic pain of left knee  Muscle weakness (generalized)  Acute pain of right knee     Problem List Patient Active Problem List   Diagnosis Date Noted  . Varicose veins of left lower extremity with complications 30/86/5784  . Dystonia, torsion, fragments of 06/17/2015  . Insomnia due to mental condition 06/17/2015  . Obesity 06/17/2015  . Akinetic rigid syndrome 12/26/2014  . Writer's cramp 12/26/2014  . Snoring 04/09/2014  . Swelling of joint of right knee 03/30/2014  . Chronic insomnia 02/19/2014  . Primary snoring 02/19/2014  . Focal dystonia 02/19/2014  . Task-specific dystonia of hand 02/19/2014  . Hand pain, right 12/04/2013  . Visit for preventive health examination 01/09/2013  . Medial knee pain 11/30/2012  . Gynecomastia, female 02/20/2011  . Neck pain 02/20/2011  . ACQUIRED ABSENCE OF BOTH CERVIX  AND UTERUS 10/03/2010  . TREMOR 07/02/2010  . VITAMIN D DEFICIENCY 06/02/2010  . OBESITY 06/24/2009  . SLEEPLESSNESS 11/16/2007  . GERD 08/08/2007    Tiffany Carson, Tiffany Carson 09/12/18 11:42 AM  [facility] [address], [place], [place], [address]  Phone: [phone]   Fax:  [phone]  Name: Tiffany Carson MRN: [id number] Date of Birth: 04/15/1966

## 2018-09-14 ENCOUNTER — Ambulatory Visit: Payer: 59

## 2018-09-14 DIAGNOSIS — M25562 Pain in left knee: Principal | ICD-10-CM

## 2018-09-14 DIAGNOSIS — M25561 Pain in right knee: Secondary | ICD-10-CM | POA: Diagnosis not present

## 2018-09-14 DIAGNOSIS — M6281 Muscle weakness (generalized): Secondary | ICD-10-CM | POA: Diagnosis not present

## 2018-09-14 DIAGNOSIS — G8929 Other chronic pain: Secondary | ICD-10-CM

## 2018-09-14 NOTE — Therapy (Signed)
[facility] [address] [place], [place], [address] Phone: [phone]   Fax:  [phone]  Physical Therapy Treatment  Patient Details  Name: Tiffany Carson MRN: [id number] Date of Birth: [birth year] Referring Provider (PT): Gaynelle Arabian, MD   Encounter Date: 09/14/2018  PT End of Session - 09/14/18 1650    Visit Number  7    Date for PT Re-Evaluation  09/23/18    Authorization Type  UMR    PT Start Time  1608    PT Stop Time  1652    PT Time Calculation (min)  44 min    Activity Tolerance  Patient tolerated treatment well    Behavior During Therapy  [facility] for tasks assessed/performed       Past Medical History:  Diagnosis Date  . ABDOMINAL PAIN RIGHT LOWER QUADRANT 10/03/2010  . Acquired absence of both cervix and uterus 10/03/2010  . ADHD (attention deficit hyperactivity disorder)    per dr Johnnye Sima  see notes  . ANEMIA, IRON DEFICIENCY 07/02/2010  . COUGH, CHRONIC 04/09/2010   pt denies  . FATIGUE 06/24/2009  . FIBROIDS, UTERUS 09/03/2010  . GERD 08/08/2007  . HYPERLIPIDEMIA 08/08/2007  . Irregular menstrual cycle 06/24/2009  . LEG CRAMPS 06/24/2009  . LEG PAIN, RIGHT 10/03/2010  . OBESITY 06/24/2009  . SLEEPLESSNESS 11/16/2007  . TINGLING 10/03/2010  . TREMOR 07/02/2010  . VITAMIN D DEFICIENCY 06/02/2010    Past Surgical History:  Procedure Laterality Date  . ABDOMINAL HYSTERECTOMY  nov 2011   secondary infection  had adhesions  . BREAST REDUCTION SURGERY  09 20 12  . BREAST SURGERY    . CHOLECYSTECTOMY    . OOPHORECTOMY     left   salpingesctomy   . REDUCTION MAMMAPLASTY Bilateral 07/2011    There were no vitals filed for this visit.  Subjective Assessment - 09/14/18 1619    Subjective  My knee gave out on me a couple of times today.  No lasting pain when this occurs.    Currently in Pain?  No/denies                       [facility] Adult PT Treatment/Exercise - 09/14/18 0001      Exercises   Exercises  Knee/Hip      Knee/Hip Exercises: Stretches   Active Hamstring Stretch  Right;Left;2 reps;20 seconds    Gastroc Stretch  Right;Left;2 reps;20 seconds   using rockerboard     Knee/Hip Exercises: Aerobic   Nustep  Level 3x 8 minutes   PT present to discuss progress     Knee/Hip Exercises: Machines for Strengthening   Cybex Leg Press  bilat 70# 20x; single 35# 20x    seat 5; cues for eccentric control.  Ball between knees     Knee/Hip Exercises: Standing   Forward Step Up  2 sets;10 reps;Right;Left;Step Height: 8"    Forward Step Up Limitations  on Bosu    SLS  Lt and Rt on green pod: 5x 10 seconds.    Rebounder  weight shifting 3 ways x 1 minute each    Walking with Sports Cord  25# forward and reverse x 10, sidestepping x 5 each      Knee/Hip Exercises: Seated   Long Arc Quad  Strengthening;10 reps;Right;Left;4 sets   w/ ball 5 sec hold;    Long Arc Quad Weight  2 lbs.  PT Short Term Goals - 09/05/18 0856      PT SHORT TERM GOAL #3   Title  Pt will report improved bil knee pain with household activities and stairs (up and down) by at least 30%.    Status  Achieved        PT Long Term Goals - 09/05/18 0857      PT LONG TERM GOAL #2   Title  Pt will report improved bil knee pain by at least 50% during household tasks and stairs (up and down).    Status  Achieved      PT LONG TERM GOAL #4   Title  Pt will demonstrate improved bil VMO recruitment and muscle bulk with quad set to allow for more normalized patellar tracking bil.    Time  6    Period  Weeks    Status  On-going              Patient will benefit from skilled therapeutic intervention in order to improve the following deficits and impairments:     Visit Diagnosis: Chronic pain of left knee  Muscle weakness (generalized)  Acute pain of right knee     Problem List Patient Active Problem List   Diagnosis Date Noted  . Varicose veins of left lower  extremity with complications 32/67/1245  . Dystonia, torsion, fragments of 06/17/2015  . Insomnia due to mental condition 06/17/2015  . Obesity 06/17/2015  . Akinetic rigid syndrome 12/26/2014  . Writer's cramp 12/26/2014  . Snoring 04/09/2014  . Swelling of joint of right knee 03/30/2014  . Chronic insomnia 02/19/2014  . Primary snoring 02/19/2014  . Focal dystonia 02/19/2014  . Task-specific dystonia of hand 02/19/2014  . Hand pain, right 12/04/2013  . Visit for preventive health examination 01/09/2013  . Medial knee pain 11/30/2012  . Gynecomastia, female 02/20/2011  . Neck pain 02/20/2011  . ACQUIRED ABSENCE OF BOTH CERVIX AND UTERUS 10/03/2010  . TREMOR 07/02/2010  . VITAMIN D DEFICIENCY 06/02/2010  . OBESITY 06/24/2009  . SLEEPLESSNESS 11/16/2007  . GERD 08/08/2007    Sigurd Sos, PT 09/14/18 4:53 PM  [facility] [address], [place], [place], [address] Phone: [phone]   Fax:  [phone]  Name: ARLENIS BLAYDES MRN: [id number] Date of Birth: 1966-03-08

## 2018-09-15 DIAGNOSIS — M25531 Pain in right wrist: Secondary | ICD-10-CM | POA: Diagnosis not present

## 2018-09-19 ENCOUNTER — Ambulatory Visit: Payer: 59

## 2018-09-19 DIAGNOSIS — M25531 Pain in right wrist: Secondary | ICD-10-CM | POA: Diagnosis not present

## 2018-09-20 DIAGNOSIS — M25831 Other specified joint disorders, right wrist: Secondary | ICD-10-CM | POA: Diagnosis not present

## 2018-09-21 ENCOUNTER — Ambulatory Visit: Payer: 59

## 2018-09-21 DIAGNOSIS — G8929 Other chronic pain: Secondary | ICD-10-CM | POA: Diagnosis not present

## 2018-09-21 DIAGNOSIS — M25561 Pain in right knee: Secondary | ICD-10-CM

## 2018-09-21 DIAGNOSIS — M6281 Muscle weakness (generalized): Secondary | ICD-10-CM | POA: Diagnosis not present

## 2018-09-21 DIAGNOSIS — M25562 Pain in left knee: Secondary | ICD-10-CM | POA: Diagnosis not present

## 2018-09-21 NOTE — Therapy (Signed)
[facility] [address] [place], [place], [address] Phone: [phone]   Fax:  [phone]  Physical Therapy Treatment  Patient Details  Name: Tiffany Carson MRN: [id number] Date of Birth: [birth year] Referring Provider (PT): Gaynelle Arabian, MD   Encounter Date: 09/21/2018  PT End of Session - 09/21/18 1656    Visit Number  8    Date for PT Re-Evaluation  11/02/18    Authorization Type  UMR    Authorization Time Period  --    PT Start Time  2542    PT Stop Time  1655    PT Time Calculation (min)  38 min    Activity Tolerance  Patient tolerated treatment well    Behavior During Therapy  [facility] for tasks assessed/performed       Past Medical History:  Diagnosis Date  . ABDOMINAL PAIN RIGHT LOWER QUADRANT 10/03/2010  . Acquired absence of both cervix and uterus 10/03/2010  . ADHD (attention deficit hyperactivity disorder)    per dr Johnnye Sima  see notes  . ANEMIA, IRON DEFICIENCY 07/02/2010  . COUGH, CHRONIC 04/09/2010   pt denies  . FATIGUE 06/24/2009  . FIBROIDS, UTERUS 09/03/2010  . GERD 08/08/2007  . HYPERLIPIDEMIA 08/08/2007  . Irregular menstrual cycle 06/24/2009  . LEG CRAMPS 06/24/2009  . LEG PAIN, RIGHT 10/03/2010  . OBESITY 06/24/2009  . SLEEPLESSNESS 11/16/2007  . TINGLING 10/03/2010  . TREMOR 07/02/2010  . VITAMIN D DEFICIENCY 06/02/2010    Past Surgical History:  Procedure Laterality Date  . ABDOMINAL HYSTERECTOMY  nov 2011   secondary infection  had adhesions  . BREAST REDUCTION SURGERY  09 20 12  . BREAST SURGERY    . CHOLECYSTECTOMY    . OOPHORECTOMY     left   salpingesctomy   . REDUCTION MAMMAPLASTY Bilateral 07/2011    There were no vitals filed for this visit.  Subjective Assessment - 09/21/18 1620    Subjective  I had to miss my appointment earlier this week as my brother was admitted to the hospital.      Patient Stated Goals  knee alignment and strength, stairs with less pain, household  tasks with less pain    Currently in Pain?  Yes    Pain Score  1     Pain Location  Knee    Pain Orientation  Right    Pain Descriptors / Indicators  Dull;Aching    Pain Type  Chronic pain    Pain Frequency  Intermittent    Aggravating Factors   it depends    Pain Relieving Factors  moving around          [facility] PT Assessment - 09/21/18 0001      Assessment   Medical Diagnosis  M22.2X9 (ICD-10-CM) - Patellofemoral disorders, unspecified knee    Onset Date/Surgical Date  07/03/18    Next MD Visit  10/13/18      [place] residence    Living Arrangements  Alone    Type of [place]  Level entry    Home Layout  One level      Cognition   Overall Cognitive Status  Within Functional Limits for tasks assessed      Observation/Other Assessments   Focus on Therapeutic Outcomes (FOTO)   31% limitation      Step Down   Comments  eccentric quad weakness R>L, fair VMO recruitment with fatigue  Strength   Overall Strength Comments  5/5 throughout bil LEs   Reduced Rt VMO recruitment     Transfers   Comments  single leg stance on Rt with instability on level surface                   [facility] Adult PT Treatment/Exercise - 09/21/18 0001      Knee/Hip Exercises: Aerobic   Nustep  Level 3x 8 minutes   PT present to discuss progress     Knee/Hip Exercises: Machines for Strengthening   Cybex Leg Press  bilat 70# 20x; single 35# 20x    seat 5; cues for eccentric control.  Ball between knees     Knee/Hip Exercises: Standing   Forward Step Up  2 sets;10 reps;Right;Left;Step Height: 8"    Forward Step Up Limitations  on Bosu    SLS  Lt and Rt on green pod: 5x 10 seconds.    Rebounder  weight shifting 3 ways x 1 minute each   good quad control   Walking with Sports Cord  25# forward and reverse x 10, sidestepping x 5 each      Knee/Hip Exercises: Seated   Long Arc Quad  Strengthening;10 reps;Right;Left;4  sets   w/ ball 5 sec hold;    Long Arc Quad Weight  2 lbs.               PT Short Term Goals - 09/05/18 0856      PT SHORT TERM GOAL #3   Title  Pt will report improved bil knee pain with household activities and stairs (up and down) by at least 30%.    Status  Achieved        PT Long Term Goals - 09/21/18 1623      PT LONG TERM GOAL #1   Title  Pt will be independent in advanced HEP    Time  6    Period  Weeks    Status  New    Target Date  11/02/18      PT LONG TERM GOAL #2   Title  Pt will report improved bil knee pain by at least 50% during household tasks and stairs (up and down).    Baseline  75-80%     Status  Achieved      PT LONG TERM GOAL #3   Title  Pt will achieve FOTO score of < or += 28% to demonstrate less restriction due to knee pain.    Baseline  31% limitation    Time  6    Period  Weeks    Status  On-going    Target Date  11/02/18      PT LONG TERM GOAL #4   Title  Pt will demonstrate improved bil VMO recruitment and muscle bulk with quad set to allow for more normalized patellar tracking bil.    Baseline  requires tactile cues at times with fatigue    Time  6    Period  Weeks    Status  On-going    Target Date  11/02/18      PT LONG TERM GOAL #5   Title  report no episodes of Rt LE instability with change of direction during work shift    Time  6    Period  Weeks    Status  New    Target Date  11/02/18            Plan - 09/21/18 1654  Clinical Impression Statement  Pt reports 75-80% reduction in Rt knee pain since the start of care.  Main complaint now is instability with change of direction and when working.  Pt demonstrates improved VMO activation yet this is fair when she is fatigued with exercise. Pt requires tactile and verbal cueing for VMO activation and to avoid hyperextension with leg press.  Pt is able to ascend and descend steps without difficulty or fatigue now.  Pt will continue to benefit from continued skilled PT  to address Rt knee instability and intermittent pain with endurance activities.      Rehab Potential  Excellent    PT Frequency  2x / week    PT Duration  6 weeks    PT Treatment/Interventions  Electrical Stimulation;Cryotherapy;ADLs/Self Care Home Management;Vasopneumatic Device;Taping;Dry needling;Manual techniques;Joint Manipulations;Balance training;Therapeutic exercise;Therapeutic activities;Functional mobility training;Stair training;Moist Heat;Iontophoresis 4mg /ml Dexamethasone;Patient/family education;Neuromuscular re-education    PT Next Visit Plan  Rt knee stability, endurance, VMO activation and flexibility    PT Home Exercise Plan  Access Code: [email or web address]    Recommended Other Services  recert sent 67/54/49    Consulted and Agree with Plan of Care  Patient       Patient will benefit from skilled therapeutic intervention in order to improve the following deficits and impairments:  Pain, Decreased mobility, Increased edema, Decreased strength, Decreased activity tolerance  Visit Diagnosis: Chronic pain of left knee - Plan: PT plan of care cert/re-cert  Muscle weakness (generalized) - Plan: PT plan of care cert/re-cert  Acute pain of right knee - Plan: PT plan of care cert/re-cert     Problem List Patient Active Problem List   Diagnosis Date Noted  . Varicose veins of left lower extremity with complications 20/08/711  . Dystonia, torsion, fragments of 06/17/2015  . Insomnia due to mental condition 06/17/2015  . Obesity 06/17/2015  . Akinetic rigid syndrome 12/26/2014  . Writer's cramp 12/26/2014  . Snoring 04/09/2014  . Swelling of joint of right knee 03/30/2014  . Chronic insomnia 02/19/2014  . Primary snoring 02/19/2014  . Focal dystonia 02/19/2014  . Task-specific dystonia of hand 02/19/2014  . Hand pain, right 12/04/2013  . Visit for preventive health examination 01/09/2013  . Medial knee pain 11/30/2012  . Gynecomastia, female 02/20/2011  . Neck pain 02/20/2011   . ACQUIRED ABSENCE OF BOTH CERVIX AND UTERUS 10/03/2010  . TREMOR 07/02/2010  . VITAMIN D DEFICIENCY 06/02/2010  . OBESITY 06/24/2009  . SLEEPLESSNESS 11/16/2007  . GERD 08/08/2007   Sigurd Sos, PT 09/21/18 5:02 PM   [facility] [address], [place], [place], [address] Phone: [phone]   Fax:  [phone]  Name: KERA DEACON MRN: [id number] Date of Birth: 1966-08-19

## 2018-09-26 ENCOUNTER — Ambulatory Visit: Payer: 59

## 2018-09-26 DIAGNOSIS — G8929 Other chronic pain: Secondary | ICD-10-CM

## 2018-09-26 DIAGNOSIS — M25562 Pain in left knee: Secondary | ICD-10-CM | POA: Diagnosis not present

## 2018-09-26 DIAGNOSIS — M6281 Muscle weakness (generalized): Secondary | ICD-10-CM | POA: Diagnosis not present

## 2018-09-26 DIAGNOSIS — M25831 Other specified joint disorders, right wrist: Secondary | ICD-10-CM | POA: Diagnosis not present

## 2018-09-26 DIAGNOSIS — M25561 Pain in right knee: Secondary | ICD-10-CM

## 2018-09-26 NOTE — Therapy (Signed)
[facility] [address] [place], [place], [address] Phone: [phone]   Fax:  [phone]  Physical Therapy Treatment  Patient Details  Name: Tiffany Carson MRN: [id number] Date of Birth: 08-Jul-1966 Referring Provider (PT): Gaynelle Arabian, MD   Encounter Date: 09/26/2018  PT End of Session - 09/26/18 1013    Visit Number  9    Date for PT Re-Evaluation  11/02/18    Authorization Type  UMR    PT Start Time  0935    PT Stop Time  1020    PT Time Calculation (min)  45 min    Activity Tolerance  Patient tolerated treatment well    Behavior During Therapy  [facility] for tasks assessed/performed       Past Medical History:  Diagnosis Date  . ABDOMINAL PAIN RIGHT LOWER QUADRANT 10/03/2010  . Acquired absence of both cervix and uterus 10/03/2010  . ADHD (attention deficit hyperactivity disorder)    per dr Johnnye Sima  see notes  . ANEMIA, IRON DEFICIENCY 07/02/2010  . COUGH, CHRONIC 04/09/2010   pt denies  . FATIGUE 06/24/2009  . FIBROIDS, UTERUS 09/03/2010  . GERD 08/08/2007  . HYPERLIPIDEMIA 08/08/2007  . Irregular menstrual cycle 06/24/2009  . LEG CRAMPS 06/24/2009  . LEG PAIN, RIGHT 10/03/2010  . OBESITY 06/24/2009  . SLEEPLESSNESS 11/16/2007  . TINGLING 10/03/2010  . TREMOR 07/02/2010  . VITAMIN D DEFICIENCY 06/02/2010    Past Surgical History:  Procedure Laterality Date  . ABDOMINAL HYSTERECTOMY  nov 2011   secondary infection  had adhesions  . BREAST REDUCTION SURGERY  09 20 12  . BREAST SURGERY    . CHOLECYSTECTOMY    . OOPHORECTOMY     left   salpingesctomy   . REDUCTION MAMMAPLASTY Bilateral 07/2011    There were no vitals filed for this visit.  Subjective Assessment - 09/26/18 0937    Subjective  I only had one episode of my knee catching over the weekend.      Currently in Pain?  No/denies                       [facility] Adult PT Treatment/Exercise - 09/26/18 0001      Transfers   Comments  single leg stance on Rt with instability on level surface      Knee/Hip Exercises: Aerobic   Nustep  Level 3x 8 minutes   PT present to discuss progress     Knee/Hip Exercises: Machines for Strengthening   Cybex Leg Press  bilat 70# 20x; single 35# 20x    seat 5; cues for eccentric control.  Ball between knees     Knee/Hip Exercises: Standing   Forward Step Up  2 sets;10 reps;Right;Left;Step Height: 8"    Forward Step Up Limitations  on Bosu    SLS  Lt and Rt on blue pod: 5x 10 seconds.    Rebounder  weight shifting 3 ways x 1 minute each   good quad control   Walking with Sports Cord  25# forward and reverse x 10, sidestepping x 5 each      Knee/Hip Exercises: Seated   Long Arc Quad  Strengthening;10 reps;Right;Left;4 sets   w/ ball 5 sec hold;    Long Arc Quad Weight  2 lbs.               PT Short Term Goals - 09/05/18 0856      PT SHORT TERM GOAL #3  Title  Pt will report improved bil knee pain with household activities and stairs (up and down) by at least 30%.    Status  Achieved        PT Long Term Goals - 09/21/18 1623      PT LONG TERM GOAL #1   Title  Pt will be independent in advanced HEP    Time  6    Period  Weeks    Status  New    Target Date  11/02/18      PT LONG TERM GOAL #2   Title  Pt will report improved bil knee pain by at least 50% during household tasks and stairs (up and down).    Baseline  75-80%     Status  Achieved      PT LONG TERM GOAL #3   Title  Pt will achieve FOTO score of < or += 28% to demonstrate less restriction due to knee pain.    Baseline  31% limitation    Time  6    Period  Weeks    Status  On-going    Target Date  11/02/18      PT LONG TERM GOAL #4   Title  Pt will demonstrate improved bil VMO recruitment and muscle bulk with quad set to allow for more normalized patellar tracking bil.    Baseline  requires tactile cues at times with fatigue    Time  6    Period  Weeks    Status  On-going     Target Date  11/02/18      PT LONG TERM GOAL #5   Title  report no episodes of Rt LE instability with change of direction during work shift    Time  6    Period  Weeks    Status  New    Target Date  11/02/18            Plan - 09/26/18 0944    Clinical Impression Statement  Pt reports 80% reduction in Rt knee pain overall.  Pt is working to improve strength and stability due to intermittent episodes of instability with long periods of standing and walking.  Pt continues to demonstrate improved VMO activation.  Pt was able to advance to blue pod with single leg stance.  Pt requires minor tactile cues for VMO activation with leg press and step ups.  Pt will continue to benefit from skilled PT for Rt LE strength and stability.      Rehab Potential  Excellent    PT Frequency  2x / week    PT Duration  6 weeks    PT Treatment/Interventions  Electrical Stimulation;Cryotherapy;ADLs/Self Care Home Management;Vasopneumatic Device;Taping;Dry needling;Manual techniques;Joint Manipulations;Balance training;Therapeutic exercise;Therapeutic activities;Functional mobility training;Stair training;Moist Heat;Iontophoresis 4mg /ml Dexamethasone;Patient/family education;Neuromuscular re-education    PT Next Visit Plan  Rt knee stability, endurance, VMO activation and flexibility    PT Home Exercise Plan  Access Code: [email or web address]    Recommended Other Services  cert and recert are signed    Consulted and Agree with Plan of Care  Patient       Patient will benefit from skilled therapeutic intervention in order to improve the following deficits and impairments:  Pain, Decreased mobility, Increased edema, Decreased strength, Decreased activity tolerance  Visit Diagnosis: Chronic pain of left knee  Muscle weakness (generalized)  Acute pain of right knee     Problem List Patient Active Problem List   Diagnosis Date Noted  . Varicose veins  of left lower extremity with complications 71/85/5015  .  Dystonia, torsion, fragments of 06/17/2015  . Insomnia due to mental condition 06/17/2015  . Obesity 06/17/2015  . Akinetic rigid syndrome 12/26/2014  . Writer's cramp 12/26/2014  . Snoring 04/09/2014  . Swelling of joint of right knee 03/30/2014  . Chronic insomnia 02/19/2014  . Primary snoring 02/19/2014  . Focal dystonia 02/19/2014  . Task-specific dystonia of hand 02/19/2014  . Hand pain, right 12/04/2013  . Visit for preventive health examination 01/09/2013  . Medial knee pain 11/30/2012  . Gynecomastia, female 02/20/2011  . Neck pain 02/20/2011  . ACQUIRED ABSENCE OF BOTH CERVIX AND UTERUS 10/03/2010  . TREMOR 07/02/2010  . VITAMIN D DEFICIENCY 06/02/2010  . OBESITY 06/24/2009  . SLEEPLESSNESS 11/16/2007  . GERD 08/08/2007    Sigurd Sos, PT 09/26/18 10:14 AM  [facility] [address], [place], [place], [address] Phone: [phone]   Fax:  [phone]  Name: Tiffany Carson MRN: [id number] Date of Birth: 1966/08/10

## 2018-10-04 DIAGNOSIS — M25831 Other specified joint disorders, right wrist: Secondary | ICD-10-CM | POA: Diagnosis not present

## 2018-10-05 ENCOUNTER — Ambulatory Visit: Payer: 59 | Attending: Orthopedic Surgery

## 2018-10-05 DIAGNOSIS — G8929 Other chronic pain: Secondary | ICD-10-CM | POA: Diagnosis not present

## 2018-10-05 DIAGNOSIS — M25561 Pain in right knee: Secondary | ICD-10-CM | POA: Diagnosis not present

## 2018-10-05 DIAGNOSIS — M6281 Muscle weakness (generalized): Secondary | ICD-10-CM | POA: Insufficient documentation

## 2018-10-05 DIAGNOSIS — M25562 Pain in left knee: Secondary | ICD-10-CM | POA: Diagnosis not present

## 2018-10-05 NOTE — Therapy (Addendum)
[facility] [address] [place], [place], [address] Phone: [phone]   Fax:  [phone]  Physical Therapy Treatment  Patient Details  Name: Tiffany Carson MRN: [id number] Date of Birth: [birth year] Referring Provider (PT): Gaynelle Arabian, MD   Encounter Date: 10/05/2018  PT End of Session - 10/05/18 1659    Visit Number  10    Date for PT Re-Evaluation  11/02/18    Authorization Type  UMR    PT Start Time  1624   late   PT Stop Time  1658    PT Time Calculation (min)  34 min    Activity Tolerance  Patient tolerated treatment well    Behavior During Therapy  [facility] for tasks assessed/performed       Past Medical History:  Diagnosis Date  . ABDOMINAL PAIN RIGHT LOWER QUADRANT 10/03/2010  . Acquired absence of both cervix and uterus 10/03/2010  . ADHD (attention deficit hyperactivity disorder)    per dr Johnnye Sima  see notes  . ANEMIA, IRON DEFICIENCY 07/02/2010  . COUGH, CHRONIC 04/09/2010   pt denies  . FATIGUE 06/24/2009  . FIBROIDS, UTERUS 09/03/2010  . GERD 08/08/2007  . HYPERLIPIDEMIA 08/08/2007  . Irregular menstrual cycle 06/24/2009  . LEG CRAMPS 06/24/2009  . LEG PAIN, RIGHT 10/03/2010  . OBESITY 06/24/2009  . SLEEPLESSNESS 11/16/2007  . TINGLING 10/03/2010  . TREMOR 07/02/2010  . VITAMIN D DEFICIENCY 06/02/2010    Past Surgical History:  Procedure Laterality Date  . ABDOMINAL HYSTERECTOMY  nov 2011   secondary infection  had adhesions  . BREAST REDUCTION SURGERY  09 20 12  . BREAST SURGERY    . CHOLECYSTECTOMY    . OOPHORECTOMY     left   salpingesctomy   . REDUCTION MAMMAPLASTY Bilateral 07/2011    There were no vitals filed for this visit.  Subjective Assessment - 10/05/18 1626    Subjective  I had 1 episode of Rt knee catching over the past 5 days.  Sometimes I feel it before it happens and stop what I am doing.      Patient Stated Goals  knee alignment and strength, stairs with less pain,  household tasks with less pain    Currently in Pain?  Yes    Pain Score  1     Pain Location  Knee    Pain Orientation  Right    Pain Descriptors / Indicators  Dull;Aching    Pain Onset  More than a month ago    Pain Frequency  Intermittent    Aggravating Factors   walking, bending the knee    Pain Relieving Factors  stopping what I am doing                       [facility] Adult PT Treatment/Exercise - 10/05/18 0001      Transfers   Comments  single leg stance on Rt with instability on level surface      Exercises   Exercises  Knee/Hip      Knee/Hip Exercises: Aerobic   Nustep  Level 3x 8 minutes   PT present to discuss progress     Knee/Hip Exercises: Machines for Strengthening   Cybex Leg Press  bilat 80# 20x; single 35# 20x    seat 5; cues for eccentric control.  Ball between knees     Knee/Hip Exercises: Standing   Forward Step Up  2 sets;10 reps;Right;Left;Step Height: 8"  Forward Step Up Limitations  on Bosu    Rocker Board  3 minutes    SLS  Lt and Rt on blue pod: 5x 10 seconds.    Rebounder  weight shifting 3 ways x 1 minute each   good quad control   Walking with Sports Cord  --    Other Standing Knee Exercises  balance on flat surface of Bosu 3x 30 seconds      Knee/Hip Exercises: Seated   Long Arc Quad  Strengthening;10 reps;Right;Left;4 sets   w/ ball 5 sec hold;    Long Arc Quad Weight  3 lbs.               PT Short Term Goals - 09/05/18 0856      PT SHORT TERM GOAL #3   Title  Pt will report improved bil knee pain with household activities and stairs (up and down) by at least 30%.    Status  Achieved        PT Long Term Goals - 09/21/18 1623      PT LONG TERM GOAL #1   Title  Pt will be independent in advanced HEP    Time  6    Period  Weeks    Status  New    Target Date  11/02/18      PT LONG TERM GOAL #2   Title  Pt will report improved bil knee pain by at least 50% during household tasks and stairs (up and down).     Baseline  75-80%     Status  Achieved      PT LONG TERM GOAL #3   Title  Pt will achieve FOTO score of < or += 28% to demonstrate less restriction due to knee pain.    Baseline  31% limitation    Time  6    Period  Weeks    Status  On-going    Target Date  11/02/18      PT LONG TERM GOAL #4   Title  Pt will demonstrate improved bil VMO recruitment and muscle bulk with quad set to allow for more normalized patellar tracking bil.    Baseline  requires tactile cues at times with fatigue    Time  6    Period  Weeks    Status  On-going    Target Date  11/02/18      PT LONG TERM GOAL #5   Title  report no episodes of Rt LE instability with change of direction during work shift    Time  6    Period  Weeks    Status  New    Target Date  11/02/18            Plan - 10/05/18 1632    Clinical Impression Statement  Pt reports 80% reduction in Rt knee pain overall.  Pt with infrequent episodes of Rt LE catching/giving out with standing and walking.  Pt continues to demonstrate improved VMO activation.  Pt was able to advance to blue pod with single leg stance over the past 2 visits.  Pt requires minor tactile cues for VMO activation with leg press and step ups.  Pt will continue to benefit from skilled PT for Rt LE strength and stability.       Rehab Potential  Excellent    PT Frequency  2x / week    PT Duration  6 weeks    PT Treatment/Interventions  Electrical Stimulation;Cryotherapy;ADLs/Self Care Home  Management;Vasopneumatic Device;Taping;Dry needling;Manual techniques;Joint Manipulations;Balance training;Therapeutic exercise;Therapeutic activities;Functional mobility training;Stair training;Moist Heat;Iontophoresis 85m/ml Dexamethasone;Patient/family education;Neuromuscular re-education    PT Next Visit Plan  Rt knee stability, endurance, VMO activation and flexibility    PT Home Exercise Plan  Access Code: [email or web address]    Consulted and Agree with Plan of Care  Patient        Patient will benefit from skilled therapeutic intervention in order to improve the following deficits and impairments:  Pain, Decreased mobility, Increased edema, Decreased strength, Decreased activity tolerance  Visit Diagnosis: Chronic pain of left knee  Muscle weakness (generalized)  Acute pain of right knee     Problem List Patient Active Problem List   Diagnosis Date Noted  . Varicose veins of left lower extremity with complications 045/62/5638 . Dystonia, torsion, fragments of 06/17/2015  . Insomnia due to mental condition 06/17/2015  . Obesity 06/17/2015  . Akinetic rigid syndrome 12/26/2014  . Writer's cramp 12/26/2014  . Snoring 04/09/2014  . Swelling of joint of right knee 03/30/2014  . Chronic insomnia 02/19/2014  . Primary snoring 02/19/2014  . Focal dystonia 02/19/2014  . Task-specific dystonia of hand 02/19/2014  . Hand pain, right 12/04/2013  . Visit for preventive health examination 01/09/2013  . Medial knee pain 11/30/2012  . Gynecomastia, female 02/20/2011  . Neck pain 02/20/2011  . ACQUIRED ABSENCE OF BOTH CERVIX AND UTERUS 10/03/2010  . TREMOR 07/02/2010  . VITAMIN D DEFICIENCY 06/02/2010  . OBESITY 06/24/2009  . SLEEPLESSNESS 11/16/2007  . GERD 08/08/2007    KSigurd Sos PT 10/05/18 5:01 PM PHYSICAL THERAPY DISCHARGE SUMMARY  Visits from Start of Care: 10  Current functional level related to goals / functional outcomes: See above for most current status.  Pt called to cancel all remaining appts.     Remaining deficits: See above.  Pt has HEP in place to address knee strength, stability and flexibility.     Education / Equipment: HEP Plan: Patient agrees to discharge.  Patient goals were met. Patient is being discharged due to the patient's request.  ?????        KSigurd Sos PT 10/24/18 8:31 AM  [place] [facility] [address] [place] [address]Phone:  [phone]  Fax:  [phone] Name: Tiffany GOREYMRN: [id number] of Birth: 7Aug 09, 1967

## 2018-10-06 DIAGNOSIS — M25531 Pain in right wrist: Secondary | ICD-10-CM | POA: Diagnosis not present

## 2018-10-06 DIAGNOSIS — M25831 Other specified joint disorders, right wrist: Secondary | ICD-10-CM | POA: Diagnosis not present

## 2018-10-09 ENCOUNTER — Ambulatory Visit (HOSPITAL_COMMUNITY)
Admission: EM | Admit: 2018-10-09 | Discharge: 2018-10-09 | Disposition: A | Discharge status: Home / Self Care | Payer: 59 | Attending: Physician Assistant | Admitting: Physician Assistant

## 2018-10-09 ENCOUNTER — Other Ambulatory Visit: Payer: Self-pay

## 2018-10-09 ENCOUNTER — Encounter (HOSPITAL_COMMUNITY): Payer: Self-pay

## 2018-10-09 DIAGNOSIS — J069 Acute upper respiratory infection, unspecified: Secondary | ICD-10-CM | POA: Diagnosis not present

## 2018-10-09 DIAGNOSIS — R05 Cough: Secondary | ICD-10-CM | POA: Diagnosis not present

## 2018-10-09 DIAGNOSIS — R059 Cough, unspecified: Secondary | ICD-10-CM

## 2018-10-09 MED ORDER — HYDROCODONE-HOMATROPINE 5-1.5 MG/5ML PO SYRP: 2.5000 mL | ORAL_SOLUTION | Freq: Every day | ORAL | 0 refills | Status: AC

## 2018-10-09 MED ORDER — BENZONATATE 100 MG PO CAPS: 100.0000 mg | ORAL_CAPSULE | Freq: Three times a day (TID) | ORAL | 0 refills | Status: DC

## 2018-10-09 MED ORDER — DOXYCYCLINE HYCLATE 100 MG PO CAPS: 100.0000 mg | ORAL_CAPSULE | Freq: Two times a day (BID) | ORAL | 0 refills | Status: AC

## 2018-10-09 NOTE — Discharge Instructions (Addendum)
Start the meds tonight. Come back as needed.

## 2018-10-09 NOTE — ED Triage Notes (Signed)
Pt cc coughing ( productive cough  green and yellowish mucus. Pt states her ears feel stopped up. X 1 week.

## 2018-10-09 NOTE — ED Provider Notes (Signed)
10/09/2018 6:53 PM   DOB: Dec 04, 1965 / MRN: [id number]  SUBJECTIVE:  Tiffany Carson is a 52 y.o. female presenting for cough, nasal congestion, mucus production times 1 week and wrosening.    She has No Known Allergies.   She  has a past medical history of ABDOMINAL PAIN RIGHT LOWER QUADRANT (10/03/2010), Acquired absence of both cervix and uterus (10/03/2010), ADHD (attention deficit hyperactivity disorder), ANEMIA, IRON DEFICIENCY (07/02/2010), COUGH, CHRONIC (04/09/2010), FATIGUE (06/24/2009), FIBROIDS, UTERUS (09/03/2010), GERD (08/08/2007), HYPERLIPIDEMIA (08/08/2007), Irregular menstrual cycle (06/24/2009), LEG CRAMPS (06/24/2009), LEG PAIN, RIGHT (10/03/2010), OBESITY (06/24/2009), SLEEPLESSNESS (11/16/2007), TINGLING (10/03/2010), TREMOR (07/02/2010), and VITAMIN D DEFICIENCY (06/02/2010).    She  reports that she has never smoked. She has never used smokeless tobacco. She reports that she drinks alcohol. She reports that she does not use drugs. She  reports that she does not engage in sexual activity. The patient  has a past surgical history that includes Cholecystectomy; Oophorectomy; Abdominal hysterectomy (nov 2011); Breast reduction surgery (09 20 12); Breast surgery; and Reduction mammaplasty (Bilateral, 07/2011).  Her family history includes Arthritis in her unknown relative; Diabetes in her mother; Diabetes (age of onset: 62) in her brother; Hypertension in her mother and unknown relative.  Review of Systems  Constitutional: Negative for diaphoresis.  HENT: Positive for sinus pain.   Eyes: Negative.   Respiratory: Positive for cough and sputum production. Negative for hemoptysis, shortness of breath and wheezing.   Cardiovascular: Negative for chest pain, orthopnea and leg swelling.  Gastrointestinal: Negative for abdominal pain, blood in stool, constipation, diarrhea, heartburn, melena, nausea and vomiting.  Genitourinary: Negative for dysuria, flank pain, frequency, hematuria and urgency.   Neurological: Negative for dizziness, sensory change, speech change, focal weakness and headaches.    OBJECTIVE:  BP 138/75 (BP Location: Right Arm)   Pulse 99   Temp 97.6 F (36.4 C)   Wt 171 lb (77.6 kg)   SpO2 100%   BMI 30.29 kg/m   Wt Readings from Last 3 Encounters:  10/09/18 171 lb (77.6 kg)  02/14/18 184 lb 12.8 oz (83.8 kg)  12/27/17 176 lb 9.6 oz (80.1 kg)   Temp Readings from Last 3 Encounters:  10/09/18 97.6 F (36.4 C)  12/27/17 (!) 97.5 F (36.4 C) (Oral)  08/24/16 98.7 F (37.1 C)   BP Readings from Last 3 Encounters:  10/09/18 138/75  02/14/18 124/77  12/27/17 124/88   Pulse Readings from Last 3 Encounters:  10/09/18 99  02/14/18 90  12/27/17 (!) 113    Physical Exam  Constitutional: She is oriented to person, place, and time. She appears well-nourished. No distress.  HENT:  Nose: Right sinus exhibits maxillary sinus tenderness and frontal sinus tenderness. Left sinus exhibits maxillary sinus tenderness and frontal sinus tenderness.  Eyes: Pupils are equal, round, and reactive to light. EOM are normal.  Cardiovascular: Normal rate, regular rhythm, S1 normal, S2 normal, normal heart sounds and intact distal pulses. Exam reveals no gallop, no friction rub and no decreased pulses.  No murmur heard. Pulmonary/Chest: Effort normal. No stridor. No respiratory distress. She has no wheezes. She has no rales.  Abdominal: She exhibits no distension.  Musculoskeletal: She exhibits no edema.  Neurological: She is alert and oriented to person, place, and time. No cranial nerve deficit. Gait normal.  Skin: Skin is dry. She is not diaphoretic.  Psychiatric: She has a normal mood and affect.  Vitals reviewed.   No results found for this or any previous visit (from the past 72  hour(s)).  No results found.  ASSESSMENT AND PLAN:   Protracted URI  Cough  Discharge Instructions   None        The patient is advised to call or return to clinic if  she does not see an improvement in symptoms, or to seek the care of the closest emergency department if she worsens with the above plan.   Philis Fendt, MHS, PA-C 10/09/2018 6:53 PM   Tereasa Coop, PA-C 10/09/18 [phone]

## 2018-10-13 DIAGNOSIS — M25562 Pain in left knee: Secondary | ICD-10-CM | POA: Diagnosis not present

## 2018-10-13 DIAGNOSIS — M25561 Pain in right knee: Secondary | ICD-10-CM | POA: Diagnosis not present

## 2018-10-17 ENCOUNTER — Ambulatory Visit: Payer: 59

## 2018-10-17 ENCOUNTER — Telehealth: Payer: Self-pay

## 2018-10-17 NOTE — Telephone Encounter (Signed)
PT called pt due to No Show today.  PT left message on voicemail.

## 2018-10-20 ENCOUNTER — Encounter: Payer: Self-pay | Admitting: Nurse Practitioner

## 2018-10-24 ENCOUNTER — Ambulatory Visit: Payer: 59

## 2018-10-24 DIAGNOSIS — Z79899 Other long term (current) drug therapy: Secondary | ICD-10-CM | POA: Diagnosis not present

## 2018-10-24 DIAGNOSIS — F902 Attention-deficit hyperactivity disorder, combined type: Secondary | ICD-10-CM | POA: Diagnosis not present

## 2018-10-24 DIAGNOSIS — F4010 Social phobia, unspecified: Secondary | ICD-10-CM | POA: Diagnosis not present

## 2018-10-31 ENCOUNTER — Ambulatory Visit: Payer: 59

## 2018-10-31 DIAGNOSIS — H2513 Age-related nuclear cataract, bilateral: Secondary | ICD-10-CM | POA: Diagnosis not present

## 2018-10-31 DIAGNOSIS — H04123 Dry eye syndrome of bilateral lacrimal glands: Secondary | ICD-10-CM | POA: Diagnosis not present

## 2018-10-31 DIAGNOSIS — D3131 Benign neoplasm of right choroid: Secondary | ICD-10-CM | POA: Diagnosis not present

## 2018-10-31 DIAGNOSIS — H43393 Other vitreous opacities, bilateral: Secondary | ICD-10-CM | POA: Diagnosis not present

## 2018-10-31 DIAGNOSIS — H353131 Nonexudative age-related macular degeneration, bilateral, early dry stage: Secondary | ICD-10-CM | POA: Diagnosis not present

## 2018-11-11 ENCOUNTER — Encounter: Payer: Self-pay | Admitting: Cardiovascular Disease

## 2018-11-11 DIAGNOSIS — I493 Ventricular premature depolarization: Secondary | ICD-10-CM

## 2018-11-11 MED ORDER — METOPROLOL TARTRATE 25 MG PO TABS: 25.0000 mg | ORAL_TABLET | Freq: Two times a day (BID) | ORAL | 3 refills | Status: DC | PRN

## 2018-11-11 MED ORDER — METOPROLOL TARTRATE 25 MG PO TABS: 12.5000 mg | ORAL_TABLET | Freq: Two times a day (BID) | ORAL | 3 refills | Status: DC | PRN

## 2018-11-11 NOTE — Addendum Note (Signed)
Addended by: Diana Eves on: 11/11/2018 02:34 PM   Modules accepted: Orders

## 2018-11-11 NOTE — Progress Notes (Signed)
Tiffany Carson has been experiencing palpitations, without other CV complaints. They appear to be occurring in the context of an upper respiratory infection, with otherwise mild symptoms. ECG shows sinus rhythm with very frequent intercalated PVCs. These are mostly monomorphic with a fairly narrow QRS. The tracing is otherwise normal. An echo was performed in 2011 and is mentioned as "normal" in her PCP notes, but I cannot locate the images in the database or a report in the EMR. Similarly, she had a normal Holter (again, cannot locate documentation). Normal heart size on CXR 2015. Will order an echo and offerred empirical beta blocker for symptom palliation, if the symptoms do not improve with resolution of URI.  Sanda Klein, MD, [facility] [phone] office [phone] pager

## 2018-11-11 NOTE — Patient Instructions (Signed)
Medication Instructions:  Dr Sallyanne Kuster has recommended making the following medication changes: 1. TAKE Metoprolol tartrate - take 12.5 mg (0.5 tablet) twice daily as needed for palpitations  If you need a refill on your cardiac medications before your next appointment, please call your pharmacy.   Testing/Procedures: Your physician has requested that you have an echocardiogram. Echocardiography is a painless test that uses sound waves to create images of your heart. It provides your doctor with information about the size and shape of your heart and how well your heart's chambers and valves are working. This procedure takes approximately one hour. There are no restrictions for this procedure.  >> This will be performed at our [facility] location [place] [place] [address] [phone]

## 2018-11-14 ENCOUNTER — Encounter: Payer: Self-pay | Admitting: Cardiovascular Disease

## 2018-11-15 ENCOUNTER — Other Ambulatory Visit: Payer: Self-pay

## 2018-11-15 ENCOUNTER — Ambulatory Visit (HOSPITAL_COMMUNITY): Payer: 59 | Attending: Cardiology

## 2018-11-15 DIAGNOSIS — I493 Ventricular premature depolarization: Secondary | ICD-10-CM | POA: Insufficient documentation

## 2018-11-22 ENCOUNTER — Ambulatory Visit (INDEPENDENT_AMBULATORY_CARE_PROVIDER_SITE_OTHER): Payer: 59

## 2018-11-22 ENCOUNTER — Encounter: Payer: Self-pay | Admitting: Internal Medicine

## 2018-11-22 ENCOUNTER — Ambulatory Visit: Payer: 59 | Admitting: Internal Medicine

## 2018-11-22 VITALS — BP 122/74 | HR 103 | Temp 98.2°F | Wt 173.8 lb

## 2018-11-22 DIAGNOSIS — E785 Hyperlipidemia, unspecified: Secondary | ICD-10-CM | POA: Diagnosis not present

## 2018-11-22 DIAGNOSIS — R05 Cough: Secondary | ICD-10-CM

## 2018-11-22 DIAGNOSIS — R053 Chronic cough: Secondary | ICD-10-CM

## 2018-11-22 DIAGNOSIS — R0789 Other chest pain: Secondary | ICD-10-CM

## 2018-11-22 DIAGNOSIS — R002 Palpitations: Secondary | ICD-10-CM

## 2018-11-22 DIAGNOSIS — R7301 Impaired fasting glucose: Secondary | ICD-10-CM

## 2018-11-22 MED ORDER — PREDNISONE 20 MG PO TABS: 20.0000 mg | ORAL_TABLET | Freq: Two times a day (BID) | ORAL | 0 refills | Status: DC

## 2018-11-22 NOTE — Patient Instructions (Signed)
Exam is reassuring  Suspect post infectious cough irritability airway.   Prednisone  trial .   pvs do not look alarming except getting more .  Have cardiology decide on the lopressor .  Will notify you  of labs when available.

## 2018-11-22 NOTE — Progress Notes (Signed)
Chief Complaint  Patient presents with  . Cough    Cough since Thanksgiving weekend. Pt seen in UC beginning of Dec. Pt feels congested in her upper chest. Dry cough. Pt states that she started having a lot of PVCs - Echo in Epic. EKG done today and having elevated BP readings today.     HPI: Tiffany Carson 53 y.o. come in fo sda for "chest congestion"  But  actually most concerned about palpitations irregular beats that she has been having off and on and more recently this week.  Last visit with me was a PV and February 2019. She has had a respiratory symptom since November that has improved but with persistent mid upper chest pressure or feeling like she has to cough.  She has had some palpitations that cause her to cough.  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 Loletha Grayer ordered an echo because of the PVCs. It is unclear whether she was prescribed Lopressor but she never got it. She states that her blood pressure readings were somewhat elevated today in the 140s 148 range. 's Recently no fever or syncope.  No change in medicines.  She is on Vyvanse and Ambien.  Her pulse rate she states is normally closer to 70 and has been closer to 100 recently.  Onset loike a cold  Sore throat and then  Not better  With otc and then went to UC   In December early  uc  Given  Antibiotic   For sinus and since then upepr gone but still ha No sob  But cough  Hoarse and more tired.  Getting  No cough cold  Sx      ROS: See pertinent positives and negatives per HPI.  No current fever otherwise feeling bad but she can feel the PVCs coming on She has another rhythm strip today with her.  No vomiting diarrhea diuretics.  No excess caffeine alcohol.  Past Medical History:  Diagnosis Date  . ABDOMINAL PAIN RIGHT LOWER QUADRANT 10/03/2010  . Acquired absence of both cervix and uterus 10/03/2010  . ADHD (attention deficit hyperactivity disorder)    per dr  Johnnye Sima  see notes  . ANEMIA, IRON DEFICIENCY 07/02/2010  . COUGH, CHRONIC 04/09/2010   pt denies  . FATIGUE 06/24/2009  . FIBROIDS, UTERUS 09/03/2010  . GERD 08/08/2007  . HYPERLIPIDEMIA 08/08/2007  . Irregular menstrual cycle 06/24/2009  . LEG CRAMPS 06/24/2009  . LEG PAIN, RIGHT 10/03/2010  . OBESITY 06/24/2009  . SLEEPLESSNESS 11/16/2007  . TINGLING 10/03/2010  . TREMOR 07/02/2010  . VITAMIN D DEFICIENCY 06/02/2010    Family History  Problem Relation Age of Onset  . Diabetes Mother   . Hypertension Mother   . Diabetes Brother 38       2013, type 2  . Arthritis Unknown   . Hypertension Unknown   . Colon cancer Neg Hx     Social History   Socioeconomic History  . Marital status: Single    Spouse name: n/a  . Number of children: 0  . Years of education: 31  . Highest education level: Not on file  Occupational History  . Occupation: NUCLEAR MEDICINE    Employer: [employer]    Comment: [employer]  Social Needs  . Financial resource strain: Not on file  . Food insecurity:    Worry: Not on file    Inability: Not  on file  . Transportation needs:    Medical: Not on file    Non-medical: Not on file  Tobacco Use  . Smoking status: Never Smoker  . Smokeless tobacco: Never Used  Substance and Sexual Activity  . Alcohol use: Yes    Alcohol/week: 0.0 - 1.0 standard drinks    Comment: rarely  . Drug use: No  . Sexual activity: Never    Birth control/protection: None  Lifestyle  . Physical activity:    Days per week: Not on file    Minutes per session: Not on file  . Stress: Not on file  Relationships  . Social connections:    Talks on phone: Not on file    Gets together: Not on file    Attends religious service: Not on file    Active member of club or organization: Not on file    Attends meetings of clubs or organizations: Not on file    Relationship status: Not on file  Other Topics Concern  . Not on file  Social History Narrative     Lives alone, with 1 dog.   Cousins live nearby.  Siblings are in middle [place].   Neg td  ocass etoh 1 caffeine per day    40 hours  Per week.    Patient is right-handed.   Patient has a Haematologist.          Outpatient Medications Prior to Visit  Medication Sig Dispense Refill  . benzonatate (TESSALON) 100 MG capsule Take 1-2 capsules (100-200 mg total) by mouth 3 (three) times daily. 30 capsule 0  . cloNIDine (CATAPRES) 0.1 MG tablet Take 0.1-0.2 mg by mouth daily.   2  . ibuprofen (ADVIL,MOTRIN) 200 MG tablet Take 200 mg by mouth every 6 (six) hours as needed.      . Omega-3 Fatty Acids (FISH OIL) 1000 MG CPDR Take 1 tablet by mouth daily.    . Vitamin D, Cholecalciferol, 400 UNITS CHEW Chew by mouth.     Marland Kitchen VYVANSE 20 MG capsule Take 20 mg by mouth daily at 3 pm.  0  . VYVANSE 40 MG capsule Take 40 mg by mouth every morning.  0  . zolpidem (AMBIEN CR) 12.5 MG CR tablet Take 1 tablet (12.5 mg total) by mouth at bedtime as needed. 90 tablet 1  . metoprolol tartrate (LOPRESSOR) 25 MG tablet Take 0.5 tablets (12.5 mg total) by mouth 2 (two) times daily as needed (palpitations). (Patient not taking: Reported on 11/22/2018) 30 tablet 3   No facility-administered medications prior to visit.      EXAM:  BP 122/74 (BP Location: Right Arm, Patient Position: Sitting, Cuff Size: Normal)   Pulse (!) 103   Temp 98.2 F (36.8 C) (Oral)   Wt 173 lb 12.8 oz (78.8 kg)   SpO2 97%   BMI 30.79 kg/m   Body mass index is 30.79 kg/m.  GENERAL: vitals reviewed and listed above, alert, oriented, appears well hydrated and in no acute distress HEENT: atraumatic, conjunctiva  clear, no obvious abnormalities on inspection of external nose and ears OP : no lesion edema or exudate  NECK: no obvious masses on inspection palpation  LUNGS: clear to auscultation bilaterally, no wheezes, rales or rhonchi, good air movement CV: HRRR, no clubbing cyanosis or  peripheral edema nl cap refill heart  rate about 90 but regular abdomen soft without organomegaly guarding or rebound.  Negative CCE. MS: moves all extremities without noticeable focal  abnormality PSYCH: pleasant  and cooperative, no obvious depression or anxiety Lab Results  Component Value Date   WBC 10.6 (H) 12/27/2017   HGB 15.8 (H) 12/27/2017   HCT 46.1 (H) 12/27/2017   PLT 428.0 (H) 12/27/2017   GLUCOSE 116 (H) 12/27/2017   CHOL 227 (H) 12/27/2017   TRIG 84.0 12/27/2017   HDL 57.50 12/27/2017   LDLDIRECT 135.4 06/27/2012   LDLCALC 152 (H) 12/27/2017   ALT 21 12/27/2017   AST 18 12/27/2017   NA 141 12/27/2017   K 4.1 12/27/2017   CL 103 12/27/2017   CREATININE 0.85 12/27/2017   BUN 15 12/27/2017   CO2 28 12/27/2017   TSH 1.68 12/27/2017   HGBA1C 6.1 12/27/2017   BP Readings from Last 3 Encounters:  11/22/18 122/74  10/09/18 138/75  02/14/18 124/77    ASSESSMENT AND PLAN:  Discussed the following assessment and plan:  Cough, persistent - Plan: Basic metabolic panel, CBC with Differential/Platelet, TSH, T4, free, Hemoglobin A1c, Lipid panel, DG Chest 2 View  Palpitation - appear unifocal frequent pvcs with sinus tach about 100 - Plan: Basic metabolic panel, CBC with Differential/Platelet, TSH, T4, free, Hemoglobin A1c, Lipid panel, DG Chest 2 View  Chest pressure - Plan: Basic metabolic panel, CBC with Differential/Platelet, TSH, T4, free, Hemoglobin A1c, Lipid panel, DG Chest 2 View  Fasting hyperglycemia - Plan: Basic metabolic panel, CBC with Differential/Platelet, TSH, T4, free, Hemoglobin A1c, Lipid panel  Hyperlipidemia, unspecified hyperlipidemia type - Plan: Basic metabolic panel, CBC with Differential/Platelet, TSH, T4, free, Hemoglobin A1c, Lipid panel Exam is reassuring strip looks like unifocal PVCs without other obvious arrhythmia although does have sinus tachycardia in the 100 range. She is on fairly high dose of stimulant medicine but states that has not been a change. Rule out metabolic  thyroid get chest x-ray rule out but benign exam today Chest x-ray shows no acute disease she could have postinfectious bronchospasm based on clinical history and context 5 days prednisone course given empirically. I would follow-up with cardiology in regard to palpitations rapid heart rate. No alarm findings on the rhythm strip she gives me today.  She will get it scanned in the document. -Patient advised to return or notify health care team  if  new concerns arise.  Patient Instructions  Exam is reassuring  Suspect post infectious cough irritability airway.   Prednisone  trial .   pvs do not look alarming except getting more .  Have cardiology decide on the lopressor .  Will notify you  of labs when available.    Standley Brooking. Kathia Covington M.D.

## 2018-11-23 LAB — BASIC METABOLIC PANEL
BUN: 12 mg/dL (ref 6–23)
CO2: 31 mEq/L (ref 19–32)
CREATININE: 0.67 mg/dL (ref 0.40–1.20)
Calcium: 10.2 mg/dL (ref 8.4–10.5)
Chloride: 100 mEq/L (ref 96–112)
GFR: 91.56 mL/min (ref >60.00)
Glucose, Bld: 104 mg/dL — ABNORMAL HIGH (ref 70–99)
Potassium: 4.6 mEq/L (ref 3.5–5.1)
Sodium: 139 mEq/L (ref 135–145)

## 2018-11-23 LAB — CBC WITH DIFFERENTIAL/PLATELET
BASOS ABS: 0.1 10*3/uL (ref 0.0–0.1)
Basophils Relative: 0.7 % (ref 0.0–3.0)
Eosinophils Absolute: 0.1 10*3/uL (ref 0.0–0.7)
Eosinophils Relative: 0.7 % (ref 0.0–5.0)
HCT: 45.4 % (ref 36.0–46.0)
Hemoglobin: 15.3 g/dL — ABNORMAL HIGH (ref 12.0–15.0)
Lymphocytes Relative: 23.1 % (ref 12.0–46.0)
Lymphs Abs: 2.4 10*3/uL (ref 0.7–4.0)
MCHC: 33.7 g/dL (ref 30.0–36.0)
MCV: 90.3 fl (ref 78.0–100.0)
Monocytes Absolute: 0.9 10*3/uL (ref 0.1–1.0)
Monocytes Relative: 9.2 % (ref 3.0–12.0)
NEUTROS ABS: 6.8 10*3/uL (ref 1.4–7.7)
Neutrophils Relative %: 66.3 % (ref 43.0–77.0)
PLATELETS: 426 10*3/uL — AB (ref 150.0–400.0)
RBC: 5.03 Mil/uL (ref 3.87–5.11)
RDW: 13.2 % (ref 11.5–15.5)
WBC: 10.3 10*3/uL (ref 4.0–10.5)

## 2018-11-23 LAB — LIPID PANEL
Cholesterol: 284 mg/dL — ABNORMAL HIGH (ref 0–200)
HDL: 71.3 mg/dL (ref >39.00)
LDL Cholesterol: 189 mg/dL — ABNORMAL HIGH (ref 0–99)
NonHDL: 212.27
Total CHOL/HDL Ratio: 4
Triglycerides: 118 mg/dL (ref 0.0–149.0)
VLDL: 23.6 mg/dL (ref 0.0–40.0)

## 2018-11-23 LAB — HEMOGLOBIN A1C: Hgb A1c MFr Bld: 6.1 % (ref 4.6–6.5)

## 2018-11-23 LAB — TSH: TSH: 0.97 u[IU]/mL (ref 0.35–4.50)

## 2018-11-23 LAB — T4, FREE: Free T4: 1.05 ng/dL (ref 0.60–1.60)

## 2018-11-25 ENCOUNTER — Telehealth: Payer: Self-pay | Admitting: Internal Medicine

## 2018-11-25 NOTE — Telephone Encounter (Signed)
Please advise Dr Panosh, thanks.   

## 2018-11-25 NOTE — Telephone Encounter (Unsigned)
Copied from [place] [id number]. Topic: Appointment Scheduling - Scheduling Inquiry for Clinic >> Nov 25, 2018 11:26 AM Bea Graff, NT wrote: Reason for CRM: Pt states she would like to see if Dr. Regis Bill can order her to have her iron levels checked to see if this is going along with some of the symptoms she is have such as the brittle nails. Please advise.

## 2018-11-25 NOTE — Telephone Encounter (Signed)
Ok to order ferritin IBc panel  Dx  Nail changes RLS

## 2018-11-28 ENCOUNTER — Other Ambulatory Visit: Payer: Self-pay

## 2018-11-28 DIAGNOSIS — L603 Nail dystrophy: Secondary | ICD-10-CM

## 2018-11-28 NOTE — Telephone Encounter (Signed)
Pt has been notified that lab orders have been placed

## 2018-11-29 ENCOUNTER — Other Ambulatory Visit: Payer: Self-pay

## 2018-11-29 DIAGNOSIS — E785 Hyperlipidemia, unspecified: Secondary | ICD-10-CM

## 2018-11-30 ENCOUNTER — Other Ambulatory Visit (INDEPENDENT_AMBULATORY_CARE_PROVIDER_SITE_OTHER): Payer: 59

## 2018-11-30 DIAGNOSIS — L603 Nail dystrophy: Secondary | ICD-10-CM

## 2018-11-30 DIAGNOSIS — E785 Hyperlipidemia, unspecified: Secondary | ICD-10-CM | POA: Diagnosis not present

## 2018-12-01 LAB — LIPID PANEL
Cholesterol: 217 mg/dL — ABNORMAL HIGH (ref 0–200)
HDL: 58.5 mg/dL (ref >39.00)
LDL CALC: 135 mg/dL — AB (ref 0–99)
NonHDL: 158.08
Total CHOL/HDL Ratio: 4
Triglycerides: 114 mg/dL (ref 0.0–149.0)
VLDL: 22.8 mg/dL (ref 0.0–40.0)

## 2018-12-01 LAB — FERRITIN: Ferritin: 99 ng/mL (ref 10.0–291.0)

## 2018-12-01 LAB — IBC PANEL
Iron: 89 ug/dL (ref 42–145)
Saturation Ratios: 24.7 % (ref 20.0–50.0)
Transferrin: 257 mg/dL (ref 212.0–360.0)

## 2019-02-09 ENCOUNTER — Telehealth: Payer: Self-pay | Admitting: Neurology

## 2019-02-09 ENCOUNTER — Encounter: Payer: Self-pay | Admitting: Neurology

## 2019-02-09 NOTE — Telephone Encounter (Signed)
Due to current COVID 19 pandemic, our office is severely reducing in office visits until further notice, in order to minimize the risk to our patients and healthcare providers.  Called patient to offer a sooner appt via virtual visit with Dr. Brett Fairy. Patient was on the schedule to see Jinny Blossom NP, and Jinny Blossom is out of office. Patient agreed to do a virtual visit. I explained to patient the necessary steps to take to have a successful connection for the webex meeting on her device. Patient verbalized understanding. Patient understands that she will receive an e-mail with further instruction, as well as a call from nurse Myriam Jacobson to update chart history and another to update insurance info if needed.    Pt understands that although there may be some limitations with this type of visit, we will take all precautions to reduce any security or privacy concerns.  Pt understands that this will be treated like an in office visit and we will file with pt's insurance, and there may be a patient responsible charge related to this service.  Pt's email is [email or web address]. Pt understands that the [employer] webex software must be downloaded and operational on the device pt plans to use for the visit.

## 2019-02-09 NOTE — Telephone Encounter (Signed)
Called and reviewed the patient's chart and made sure it was updated. Pt verbalized understanding of downloading the app and did receive the e-mail.

## 2019-02-09 NOTE — Addendum Note (Signed)
Addended by: Darleen Crocker on: 02/09/2019 03:33 PM   Modules accepted: Orders

## 2019-02-16 ENCOUNTER — Encounter: Payer: Self-pay | Admitting: Neurology

## 2019-02-16 ENCOUNTER — Ambulatory Visit (INDEPENDENT_AMBULATORY_CARE_PROVIDER_SITE_OTHER): Payer: 59 | Admitting: Neurology

## 2019-02-16 ENCOUNTER — Other Ambulatory Visit: Payer: Self-pay

## 2019-02-16 DIAGNOSIS — G248 Other dystonia: Secondary | ICD-10-CM

## 2019-02-16 DIAGNOSIS — F5104 Psychophysiologic insomnia: Secondary | ICD-10-CM

## 2019-02-16 MED ORDER — TRAZODONE HCL 50 MG PO TABS: 50.0000 mg | ORAL_TABLET | Freq: Every evening | ORAL | 5 refills | Status: DC | PRN

## 2019-02-16 MED ORDER — ZOLPIDEM TARTRATE ER 12.5 MG PO TBCR: 12.5000 mg | EXTENDED_RELEASE_TABLET | Freq: Every evening | ORAL | 1 refills | Status: DC | PRN

## 2019-02-16 NOTE — Patient Instructions (Signed)
Rv with Np in 6 month.    Trazodone tablets What is this medicine? TRAZODONE (TRAZ oh done) is used to treat depression. This medicine may be used for other purposes; ask your health care provider or pharmacist if you have questions. COMMON BRAND NAME(S): Desyrel What should I tell my health care provider before I take this medicine? They need to know if you have any of these conditions: -attempted suicide or thinking about it -bipolar disorder -bleeding problems -glaucoma -heart disease, or previous heart attack -irregular heart beat -kidney or liver disease -low levels of sodium in the blood -an unusual or allergic reaction to trazodone, other medicines, foods, dyes or preservatives -pregnant or trying to get pregnant -breast-feeding How should I use this medicine? Take this medicine by mouth with a glass of water. Follow the directions on the prescription label. Take this medicine shortly after a meal or a light snack. Take your medicine at regular intervals. Do not take your medicine more often than directed. Do not stop taking this medicine suddenly except upon the advice of your doctor. Stopping this medicine too quickly may cause serious side effects or your condition may worsen. A special MedGuide will be given to you by the pharmacist with each prescription and refill. Be sure to read this information carefully each time. Talk to your pediatrician regarding the use of this medicine in children. Special care may be needed. Overdosage: If you think you have taken too much of this medicine contact a poison control center or emergency room at once. NOTE: This medicine is only for you. Do not share this medicine with others. What if I miss a dose? If you miss a dose, take it as soon as you can. If it is almost time for your next dose, take only that dose. Do not take double or extra doses. What may interact with this medicine? Do not take this medicine with any of the following  medications: -certain medicines for fungal infections like fluconazole, itraconazole, ketoconazole, posaconazole, voriconazole -cisapride -dofetilide -dronedarone -linezolid -MAOIs like Carbex, Eldepryl, Marplan, Nardil, and Parnate -mesoridazine -methylene blue (injected into a vein) -pimozide -saquinavir -thioridazine This medicine may also interact with the following medications: -alcohol -antiviral medicines for HIV or AIDS -aspirin and aspirin-like medicines -barbiturates like phenobarbital -certain medicines for blood pressure, heart disease, irregular heart beat -certain medicines for depression, anxiety, or psychotic disturbances -certain medicines for migraine headache like almotriptan, eletriptan, frovatriptan, naratriptan, rizatriptan, sumatriptan, zolmitriptan -certain medicines for seizures like carbamazepine and phenytoin -certain medicines for sleep -certain medicines that treat or prevent blood clots like dalteparin, enoxaparin, warfarin -digoxin -fentanyl -lithium -NSAIDS, medicines for pain and inflammation, like ibuprofen or naproxen -other medicines that prolong the QT interval (cause an abnormal heart rhythm) -rasagiline -supplements like St. John's wort, kava kava, valerian -tramadol -tryptophan This list may not describe all possible interactions. Give your health care provider a list of all the medicines, herbs, non-prescription drugs, or dietary supplements you use. Also tell them if you smoke, drink alcohol, or use illegal drugs. Some items may interact with your medicine. What should I watch for while using this medicine? Tell your doctor if your symptoms do not get better or if they get worse. Visit your doctor or health care professional for regular checks on your progress. Because it may take several weeks to see the full effects of this medicine, it is important to continue your treatment as prescribed by your doctor. Patients and their families  should watch out for new  or worsening thoughts of suicide or depression. Also watch out for sudden changes in feelings such as feeling anxious, agitated, panicky, irritable, hostile, aggressive, impulsive, severely restless, overly excited and hyperactive, or not being able to sleep. If this happens, especially at the beginning of treatment or after a change in dose, call your health care professional. Dennis Bast may get drowsy or dizzy. Do not drive, use machinery, or do anything that needs mental alertness until you know how this medicine affects you. Do not stand or sit up quickly, especially if you are an older patient. This reduces the risk of dizzy or fainting spells. Alcohol may interfere with the effect of this medicine. Avoid alcoholic drinks. This medicine may cause dry eyes and blurred vision. If you wear contact lenses you may feel some discomfort. Lubricating drops may help. See your eye doctor if the problem does not go away or is severe. Your mouth may get dry. Chewing sugarless gum, sucking hard candy and drinking plenty of water may help. Contact your doctor if the problem does not go away or is severe. What side effects may I notice from receiving this medicine? Side effects that you should report to your doctor or health care professional as soon as possible: -allergic reactions like skin rash, itching or hives, swelling of the face, lips, or tongue -elevated mood, decreased need for sleep, racing thoughts, impulsive behavior -confusion -fast, irregular heartbeat -feeling faint or lightheaded, falls -feeling agitated, angry, or irritable -loss of balance or coordination -painful or prolonged erections -restlessness, pacing, inability to keep still -suicidal thoughts or other mood changes -tremors -trouble sleeping -seizures -unusual bleeding or bruising Side effects that usually do not require medical attention (report to your doctor or health care professional if they continue or are  bothersome): -change in sex drive or performance -change in appetite or weight -constipation -headache -muscle aches or pains -nausea This list may not describe all possible side effects. Call your doctor for medical advice about side effects. You may report side effects to FDA at 1-800-FDA-1088. Where should I keep my medicine? Keep out of the reach of children. Store at room temperature between 15 and 30 degrees C (59 to 86 degrees F). Protect from light. Keep container tightly closed. Throw away any unused medicine after the expiration date. NOTE: This sheet is a summary. It may not cover all possible information. If you have questions about this medicine, talk to your doctor, pharmacist, or health care provider.  2019 Elsevier/Gold Standard (2017-12-28 17:51:24)

## 2019-02-16 NOTE — Progress Notes (Signed)
Virtual Visit via Video Note  I connected with Blenda Bridegroom on 02/16/19 at 10:30 AM EDT by a video enabled telemedicine application and verified that I am speaking with the correct person using two identifiers.   I discussed the limitations of evaluation and management by telemedicine and the availability of in person appointments. The patient expressed understanding and agreed to proceed.  Larey Seat, MD  02-16-2019  History of Present Illness:   Phynix Horton is a 53 year old patient with chronic insomnia, no longer with focal dystonia. Ambien dependent.    HPI:  LAM BJORKLUND is a 53 y.o. female , seen here as a revisit  from Dr. Regis Bill for  Insomnia, but presenting with a task specific dystonia.  Chief complaint according to patient : here today for Ambien refill. The patient reports that she is doing well on Ambien 12.5 in an extended release form, provided by Dr. Regis Bill.  The patient had undergone a sleep study which revealed no organic sleep disorder of significance. She is meanwhile 53 years old, she underwent a hysterectomy at age 23 and very likely went into menopause within the next 2 years. This was a time of exacerbation of insomnia.  She still needs about an hour to fall asleep after taking Ambien. Her bedtime is usually around 11:30 PM so she may fall asleep anytime between midnight and 1 AM. She does not have nocturia breaks, she usually sleeps through until 4 AM when her alarm rings. So she really doesn't gets a lot of sleep only about 4 hours with the help of medication each night. Her fatigue severity score was endorsed at 23 points the Epworth sleepiness score at 4 points both are below average not indicative of high fatigue or high sleepiness. She arrives at work at 6 AM. She takes rarely afternoon naps, if so- 30 minutes duration.  She was given Vyvanse for adult ADD, and has tolerated this well, no tics, no twitches. 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  She is aware that the continuous use of Ambien can lead to addiction. She had sleep eating spells on Ambien.   Social history: full tie gainfully employed, reviewed sleep habits, caffeine use 1 cup in AM.  No ETOH.  Non smoker.  Lives alone - wants to move her mother into her own home.  Pets; 2 dogs.    Review of Systems: Out of a complete 14 system review, the patient complains of only the following symptoms, and all other reviewed systems are negative.   How likely are you to doze in the following situations: 0 = not likely, 1 = slight chance, 2 = moderate chance, 3 = high chance  Sitting and Reading? Watching Television? Sitting inactive in a public place (theater or meeting)? Lying down in the afternoon when circumstances permit? Sitting and talking to someone? Sitting quietly after lunch without alcohol? In a car, while stopped for a few minutes in traffic? As a passenger in a car for an hour without a break?  Total = 4/ 24    Epworth score  4 , Fatigue severity score 30/ 67  .  No PHQ score obtained.  Anxious , worried but not depressed.    Social History   Social History  . Marital Status: Single    Spouse Name: n/a  . Number of Children: 0  . Years of Education: 16   Occupational History  . NUCLEAR  MEDICINE     [employer]   Social History Main Topics  . Smoking status: Never Smoker   . Smokeless tobacco: Never Used  . Alcohol Use: 0.0 - 0.6 oz/week    0-1 Standard drinks or equivalent per week     Comment: rarely  . Drug Use: No  . Sexual Activity: No   Other Topics Concern  . Not on file   Social History Narrative   Lives alone, with 1 dog.   Cousins live nearby.  Siblings are in middle [place].   Neg td  ocass etoh 1 caffeine per day    40 hours  Per week.    Patient is right-handed.   Patient has a Haematologist.             Family History  Problem  Relation Age of Onset  . Arthritis    . Hypertension    . Diabetes Mother   . Hypertension Mother   . Diabetes Brother 52    2013, type 2    Past Medical History  Diagnosis Date  . FIBROIDS, UTERUS 09/03/2010  . VITAMIN D DEFICIENCY 06/02/2010  . HYPERLIPIDEMIA 08/08/2007  . OBESITY 06/24/2009  . ANEMIA, IRON DEFICIENCY 07/02/2010  . GERD 08/08/2007  . Irregular menstrual cycle 06/24/2009  . LEG PAIN, RIGHT 10/03/2010  . LEG CRAMPS 06/24/2009  . SLEEPLESSNESS 11/16/2007  . FATIGUE 06/24/2009  . TREMOR 07/02/2010  . TINGLING 10/03/2010  . COUGH, CHRONIC 04/09/2010  . ABDOMINAL PAIN RIGHT LOWER QUADRANT 10/03/2010  . Acquired absence of both cervix and uterus 10/03/2010    Past Surgical History  Procedure Laterality Date  . Cholecystectomy    . Oophorectomy      left   salpingesctomy   . Abdominal hysterectomy  nov 2011    secondary infection  had adhesions  . Breast reduction surgery  09 20 12  . Breast surgery      Current Outpatient Prescriptions  Medication Sig Dispense Refill  . botulinum toxin Type A (BOTOX) 100 UNITS SOLR injection 100 units will be administered in the office by provider to right muscle for dystonia every 3 months. 1 vial 1  . ibuprofen (ADVIL,MOTRIN) 200 MG tablet Take 200 mg by mouth every 6 (six) hours as needed.      Marland Kitchen lisdexamfetamine (VYVANSE) 30 MG capsule Take 30 mg by mouth daily.    . meloxicam (MOBIC) 15 MG tablet TAKE 1 TABLET BY MOUTH ONCE DAILY 30 tablet 2  . omeprazole (PRILOSEC) 20 MG capsule Take 20 mg by mouth as needed.    . Vitamin D, Cholecalciferol, 400 UNITS CHEW Chew by mouth.     . zolpidem (AMBIEN CR) 12.5 MG CR tablet Take 1 tablet (12.5 mg total) by mouth at bedtime as needed. for sleep 30 tablet 0   No current facility-administered medications for this visit.    Allergies as of 06/17/2015  . (No Active Allergies)    Vitals: BP 132/70 mmHg  Pulse 86  Resp 20  Ht 5\' 2"  (1.575 m)  Wt 180 lb (81.647 kg)  BMI 32.91 kg/m2 Last  Weight:  Wt Readings from Last 1 Encounters:  06/17/15 180 lb (81.647 kg)   EXN:TZGY mass index is 32.91 kg/(m^2).     Last Height:   Ht Readings from Last 1 Encounters:  06/17/15 5\' 2"  (1.575 m)    Physical exam:  General: The patient is awake, alert and appears not in acute distress. The patient is well  groomed.  Mallampati 3,  neck circumference: 15. Normal ROM for shoulder and neck. Nasal airflow seasonally restricted ,  Retrognathia is not seen.  Trunk: BMI is elevated . The patient's posture is erect.  Neurologic exam : The patient is awake and alert, oriented to place and time.   Memory subjective described as intact.   Attention span & concentration ability appears normal.  Speech is fluent, without  dysarthria, dysphonia or aphasia.  Mood and affect are appropriate.  Cranial nerves: Pupils are equal.   Facial motor strength is symmetric and tongue and uvula move midline. Shoulder shrug was symmetrical.   Motor exam:   muscle bulk and is symmetric strength in all extremities.l.  Coordination: Rapid alternating movements in the fingers/hands was normal. Finger-to-nose maneuver  normal without evidence of ataxia, dysmetria or tremor.  She had a task specific dystonia/ writers cramp, which was cured after a trigger finger repair and ganglion cyst removed ( Dr. Amedeo Plenty) .   Gait and station: Patient walks without assistive device     My assessment is:   1) chronic insomnia- uses Ambien 12.5 mg prn which has become a daily use- over 5 years on medication. . Slept reportedly best on 15 mg Ambien. Has seen carolyn martin Np in the last 3 years.  She needs to try trazodone on week ends. 50 mg at 10 PM.   2)  facial Dystonia has resolved.  3) obesity - discussed low carb diet.   4) sleep study has shown no significant apnea, not enough to constitute a cause for insomnia.   Assessment and Plan: sleep hygiene discussed , cool and quiet and dark. Hot shower  Before bed. No  caffeine. Read in bed.   I will write Trazodone 50 mg tab and still refill Ambien at 12.5 mg - she has to take trazodone first and trial on week end to take take trazodone alone.     Follow Up Instructions: Rv with Np in 6 month.  Trazodone prescribed.  Ambien 12.5 mg refilled.   Consider cognitive behavior therapy - I suggested and patient is open to do this for INSOMNIA>     I discussed the assessment and treatment plan with the patient. The patient was provided an opportunity to ask questions and all were answered. The patient agreed with the plan and demonstrated an understanding of the instructions.   The patient was advised to call back or seek an in-person evaluation if the symptoms worsen or if the condition fails to improve as anticipated.  I provided 25 minutes of non-face-to-face time during this encounter.   Larey Seat, MD

## 2019-02-20 ENCOUNTER — Ambulatory Visit: Payer: 59 | Admitting: Nurse Practitioner

## 2019-02-20 ENCOUNTER — Telehealth: Payer: Self-pay | Admitting: Neurology

## 2019-02-20 IMAGING — DX DG CHEST 2V
2 series · 2 of 2 positions shown · non-contrast
Comparison: 03/05/2014

CLINICAL DATA: Cough and palpitations with chest pressure

EXAM:
CHEST - 2 VIEW

[chest pa]
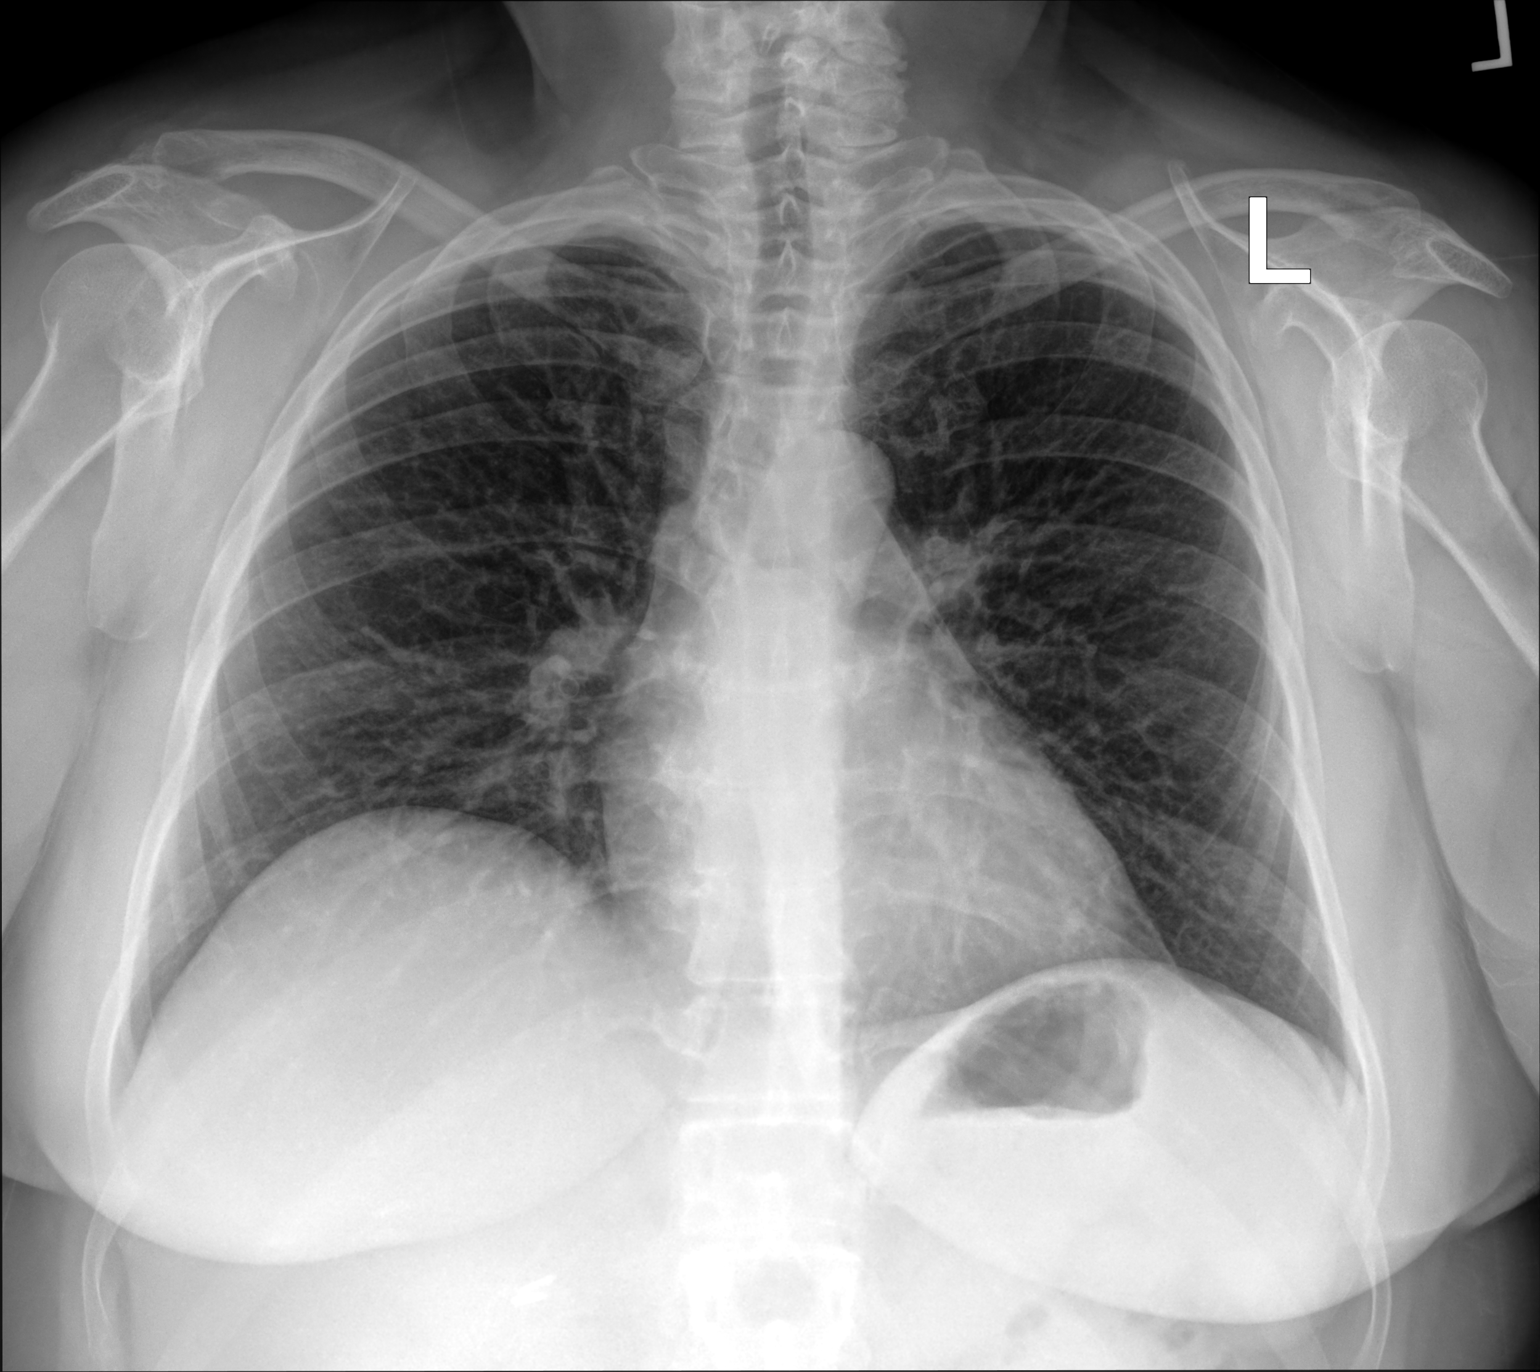

[chest lat]
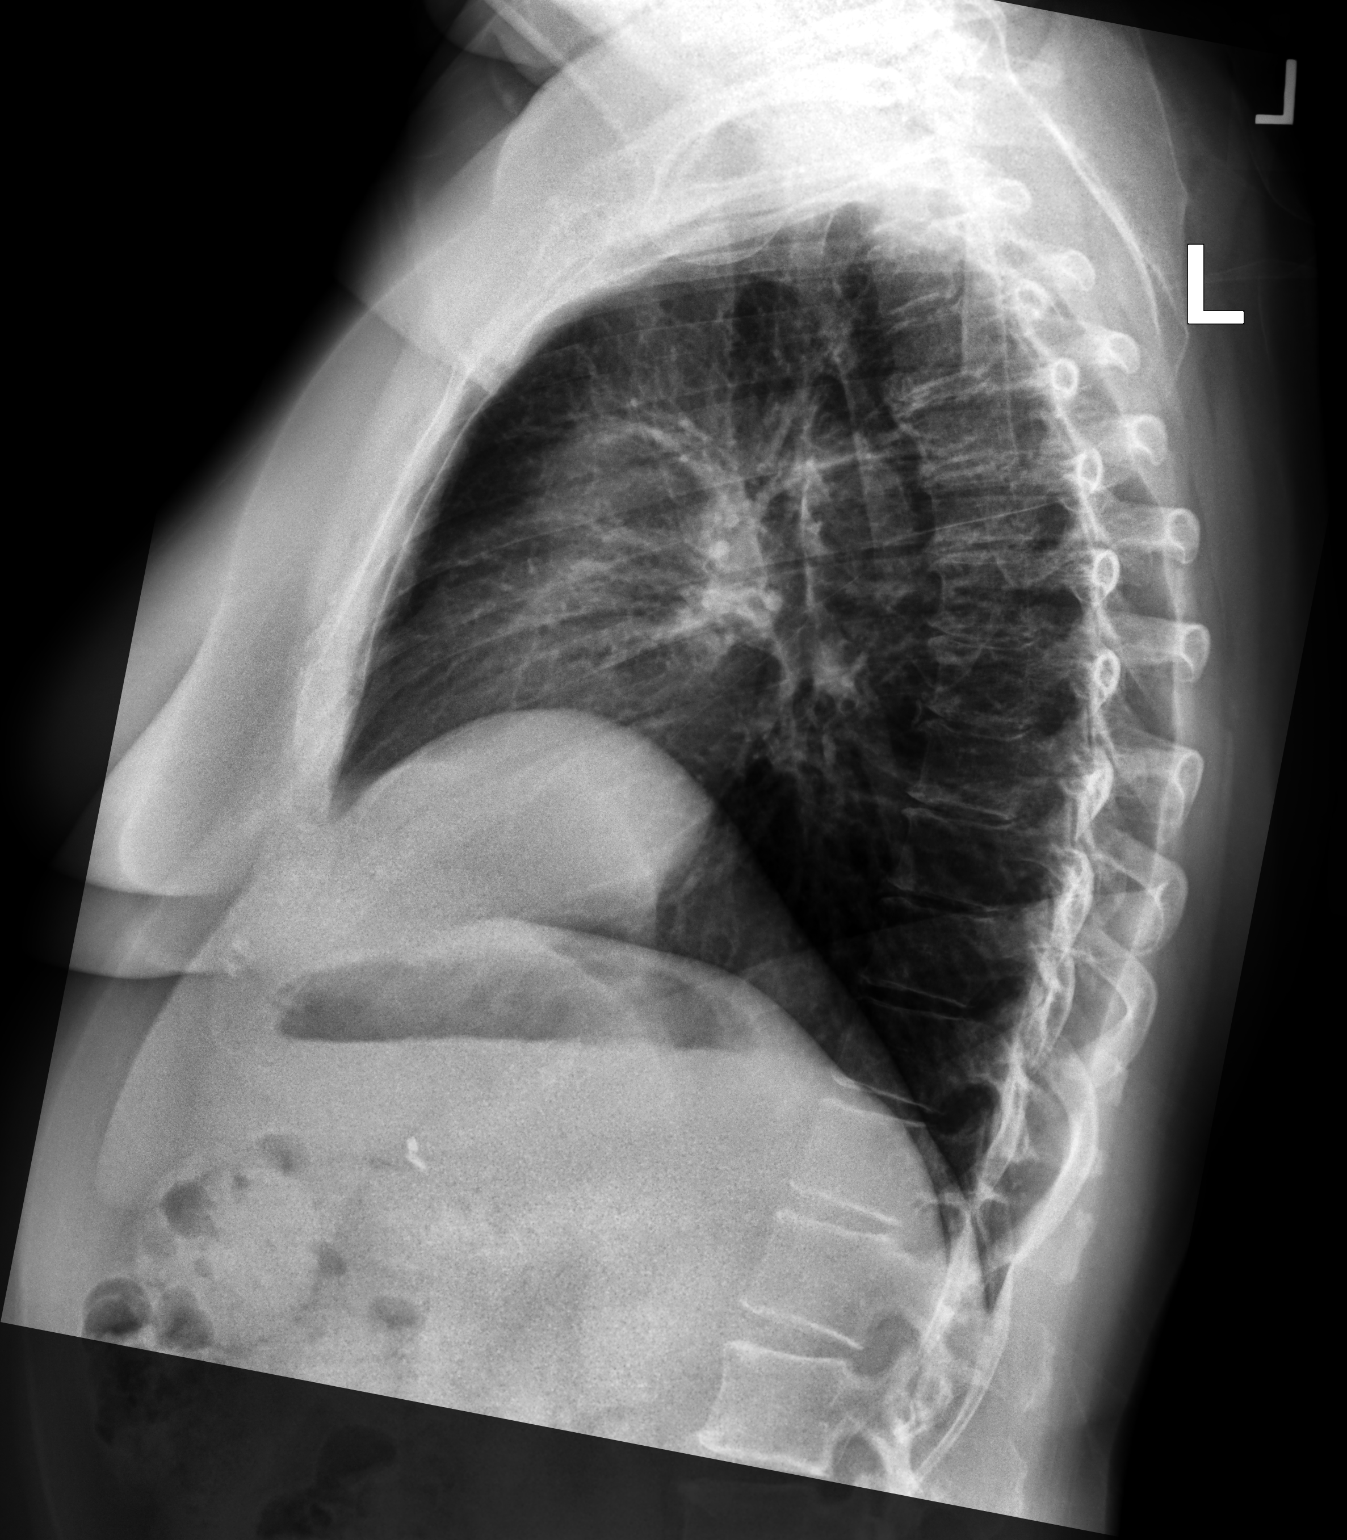

[2 of 2 positions shown; findings below may reference images not displayed]

FINDINGS: Normal heart size. No pleural effusion or edema. No airspace
opacities identified. The visualized osseous structures are
unremarkable.
IMPRESSION: 1. No acute cardiopulmonary abnormalities.

## 2019-02-20 NOTE — Telephone Encounter (Signed)
LVM to schedule 6 month follow-up with NP after virtual visit per Dr. Brett Fairy.

## 2019-03-06 ENCOUNTER — Ambulatory Visit: Payer: 59 | Admitting: Adult Health

## 2019-03-13 DIAGNOSIS — F902 Attention-deficit hyperactivity disorder, combined type: Secondary | ICD-10-CM | POA: Diagnosis not present

## 2019-08-14 ENCOUNTER — Other Ambulatory Visit: Payer: Self-pay | Admitting: Neurology

## 2019-08-14 DIAGNOSIS — F5104 Psychophysiologic insomnia: Secondary | ICD-10-CM

## 2019-08-18 ENCOUNTER — Telehealth: Payer: Self-pay | Admitting: Neurology

## 2019-08-18 DIAGNOSIS — F5104 Psychophysiologic insomnia: Secondary | ICD-10-CM

## 2019-08-18 NOTE — Telephone Encounter (Signed)
I understand that Dr. Regis Bill is ordering her Ambien for years - and not we at [facility] ? Am I  wrong?

## 2019-08-18 NOTE — Telephone Encounter (Signed)
Patient called back again re: ambien Rx, I called her number twice, went to VM and mailbox was full.

## 2019-08-18 NOTE — Telephone Encounter (Signed)
I receive a Rx request for Ambien CR via after hours call service. I see that Rx was approved by Dr. Brett Fairy today.

## 2019-08-25 MED ORDER — ZOLPIDEM TARTRATE ER 12.5 MG PO TBCR: 12.5000 mg | EXTENDED_RELEASE_TABLET | Freq: Every evening | ORAL | 1 refills | Status: DC | PRN

## 2019-08-25 NOTE — Telephone Encounter (Signed)
I understand , CD

## 2019-08-25 NOTE — Telephone Encounter (Signed)
I have not  Prescribe  ambien for her for  The last 5 years   . This has been prescribed by neurology or other    Am reluctant to prescribe    To be taken on a regular basis

## 2019-08-25 NOTE — Addendum Note (Signed)
Addended by: Larey Seat on: 08/25/2019 11:38 AM   Modules accepted: Orders

## 2019-08-25 NOTE — Telephone Encounter (Signed)
Refilled med

## 2019-09-15 ENCOUNTER — Telehealth: Payer: 59 | Admitting: Physician Assistant

## 2019-09-15 DIAGNOSIS — S299XXA Unspecified injury of thorax, initial encounter: Secondary | ICD-10-CM

## 2019-09-15 MED ORDER — CYCLOBENZAPRINE HCL 10 MG PO TABS: 10.0000 mg | ORAL_TABLET | Freq: Three times a day (TID) | ORAL | 0 refills | Status: DC | PRN

## 2019-09-15 NOTE — Progress Notes (Signed)
I spent more than 5 min, but less than 58min on this E-Visit,   Sorry to hear you had an injury. Please practice deep breaths while you hold a pillow to your chest several times  Each hour. Use 2 tablets of tylenol with each meal and at bed time (four times daily). Use flexeril three times daily for spasm pain. This medication may cause you to be drowsy, so use caution getting around. Please see MD at the Emergency Dept or urgent care in you symptoms are worsening.

## 2019-09-16 MED ORDER — CYCLOBENZAPRINE HCL 10 MG PO TABS: 10.0000 mg | ORAL_TABLET | Freq: Three times a day (TID) | ORAL | 0 refills | Status: DC | PRN

## 2019-09-16 NOTE — Addendum Note (Signed)
Addended by: Chevis Pretty on: 09/16/2019 12:09 PM   Modules accepted: Orders

## 2019-09-27 ENCOUNTER — Other Ambulatory Visit: Payer: Self-pay

## 2019-09-27 ENCOUNTER — Encounter: Payer: Self-pay | Admitting: Family Medicine

## 2019-09-27 ENCOUNTER — Ambulatory Visit: Payer: 59 | Admitting: Family Medicine

## 2019-09-27 ENCOUNTER — Ambulatory Visit (INDEPENDENT_AMBULATORY_CARE_PROVIDER_SITE_OTHER): Payer: 59

## 2019-09-27 VITALS — BP 114/74 | HR 78 | Temp 97.4°F | Wt 178.9 lb

## 2019-09-27 DIAGNOSIS — R0781 Pleurodynia: Secondary | ICD-10-CM

## 2019-09-27 DIAGNOSIS — S2232XA Fracture of one rib, left side, initial encounter for closed fracture: Secondary | ICD-10-CM | POA: Diagnosis not present

## 2019-09-27 MED ORDER — TRAMADOL HCL 50 MG PO TABS: ORAL_TABLET | ORAL | 0 refills | Status: DC

## 2019-09-27 NOTE — Patient Instructions (Signed)
Rib Contusion A rib contusion is a deep bruise on your rib area. Contusions are the result of a blunt trauma that causes bleeding and injury to the tissues under the skin. A rib contusion may involve bruising of the ribs and of the skin and muscles in the area. The skin over the contusion may turn blue, purple, or yellow. Minor injuries will give you a painless contusion. More severe contusions may stay painful and swollen for a few weeks. What are the causes? This condition is usually caused by a blow, trauma, or direct force to an area of the body. This often occurs while playing contact sports. What are the signs or symptoms? Symptoms of this condition include:  Swelling and redness of the injured area.  Discoloration of the injured area.  Tenderness and soreness of the injured area.  Pain with or without movement. How is this diagnosed? This condition may be diagnosed based on:  Your symptoms and medical history.  A physical exam.  Imaging tests-such as an X-ray, CT scan, or MRI-to determine if there were internal injuries or broken bones (fractures). How is this treated? This condition may be treated with:  Rest. This is often the best treatment for a rib contusion.  Icing. This reduces swelling and inflammation.  Deep-breathing exercises. These may be recommended to reduce the risk for lung collapse and pneumonia.  Medicines. Over-the-counter or prescription medicines may be given to control pain.  Injection of a numbing medicine around the nerve near your injury (nerve block). Follow these instructions at home:     Medicines  Take over-the-counter and prescription medicines only as told by your health care provider.  Do not drive or use heavy machinery while taking prescription pain medicine.  If you are taking prescription pain medicine, take actions to prevent or treat constipation. Your health care provider may recommend that you: ? Drink enough fluid to keep  your urine pale yellow. ? Eat foods that are high in fiber, such as fresh fruits and vegetables, whole grains, and beans. ? Limit foods that are high in fat and processed sugars, such as fried or sweet foods. ? Take an over-the-counter or prescription medicine for constipation. Managing pain, stiffness, and swelling  If directed, put ice on the injured area: ? Put ice in a plastic bag. ? Place a towel between your skin and the bag. ? Leave the ice on for 20 minutes, 2-3 times a day.  Rest the injured area. Avoid strenuous activity and any activities or movements that cause pain. Be careful during activities and avoid bumping the injured area.  Do not lift anything that is heavier than 5 lb (2.3 kg), or the limit that you are told, until your health care provider says that it is safe. General instructions  Do not use any products that contain nicotine or tobacco, such as cigarettes and e-cigarettes. These can delay healing. If you need help quitting, ask your health care provider.  Do deep-breathing exercises as told by your health care provider.  If you were given an incentive spirometer, use it every 1-2 hours while you are awake, or as recommended by your health care provider. This device measures how well you are filling your lungs with each breath.  Keep all follow-up visits as told by your health care provider. This is important. Contact a health care provider if you have:  Increased bruising or swelling.  Pain that is not controlled with treatment.  A fever. Get help right away if   you:  Have difficulty breathing or shortness of breath.  Develop a continual cough or you cough up thick or bloody sputum.  Feel nauseous or you vomit.  Have pain in your abdomen. Summary  A rib contusion is a deep bruise on your rib area. Contusions are the result of a blunt trauma that causes bleeding and injury to the tissues under the skin.  The skin overlying the contusion may turn  blue, purple, or yellow. Minor injuries may give you a painless contusion. More severe contusions may stay painful and swollen for a few weeks.  Rest the injured area. Avoid strenuous activity and any activities or movements that cause pain. This information is not intended to replace advice given to you by your health care provider. Make sure you discuss any questions you have with your health care provider. Document Released: 07/14/2001 Document Revised: 11/17/2017 Document Reviewed: 11/17/2017 Elsevier Patient Education  2020 Elsevier Inc[employer]  

## 2019-09-27 NOTE — Progress Notes (Signed)
Subjective:     Patient ID: Tiffany Carson, female   DOB: 09-Dec-1965, 53 y.o.   MRN: [id number]  HPI Tiffany Carson is seen with concern for possible right anterior rib fracture(s).  She states about 2 weeks ago she was getting out of her bathtub and she had her hand on the side of the tub and her hand slipped and she fell against the edge of the tub right anterior rib cage area.  She has had some significant pain with deep breathing since then.  She did an E- visit and was told to take some Tylenol and Flexeril and has not seen much improvement with that.  She has very difficult time getting comfortable at night and has had difficulty sleeping.  No fevers or chills.  No hemoptysis.  No other injuries reported.  Past Medical History:  Diagnosis Date  . ABDOMINAL PAIN RIGHT LOWER QUADRANT 10/03/2010  . Acquired absence of both cervix and uterus 10/03/2010  . ADHD (attention deficit hyperactivity disorder)    per dr Johnnye Sima  see notes  . ANEMIA, IRON DEFICIENCY 07/02/2010  . COUGH, CHRONIC 04/09/2010   pt denies  . FATIGUE 06/24/2009  . FIBROIDS, UTERUS 09/03/2010  . GERD 08/08/2007  . HYPERLIPIDEMIA 08/08/2007  . Irregular menstrual cycle 06/24/2009  . LEG CRAMPS 06/24/2009  . LEG PAIN, RIGHT 10/03/2010  . OBESITY 06/24/2009  . SLEEPLESSNESS 11/16/2007  . TINGLING 10/03/2010  . TREMOR 07/02/2010  . VITAMIN D DEFICIENCY 06/02/2010   Past Surgical History:  Procedure Laterality Date  . ABDOMINAL HYSTERECTOMY  nov 2011   secondary infection  had adhesions  . BREAST REDUCTION SURGERY  09 20 12  . BREAST SURGERY    . CHOLECYSTECTOMY    . CYST REMOVAL HAND     right hand 2019  . OOPHORECTOMY     left   salpingesctomy   . REDUCTION MAMMAPLASTY Bilateral 07/2011  . TRIGGER FINGER RELEASE  08/2018   right middle finger and excision of ganglion cyst of right wrist    reports that she has never smoked. She has never used smokeless tobacco. She reports current alcohol use. She reports that she does not use  drugs. family history includes Arthritis in an other family member; Diabetes in her mother; Diabetes (age of onset: 73) in her brother; Hypertension in her mother and another family member. Allergies  Allergen Reactions  . Percocet [Oxycodone-Acetaminophen] Itching     Review of Systems  Constitutional: Negative for chills and fever.  Respiratory: Negative for cough and shortness of breath.   Cardiovascular: Negative for chest pain.  Gastrointestinal: Negative for abdominal pain.  Neurological: Negative for dizziness.       Objective:   Physical Exam Vitals signs reviewed.  Constitutional:      Appearance: Normal appearance.  Cardiovascular:     Rate and Rhythm: Normal rate and regular rhythm.  Pulmonary:     Effort: Pulmonary effort is normal.     Breath sounds: Normal breath sounds. No wheezing or rales.     Comments: She has tenderness over the right anterior rib cage area around the eighth and ninth rib region.  No palpable deformity Neurological:     Mental Status: She is alert.        Assessment:     Recent fall with right anterior rib pain.  Rule out rib fracture    Plan:     -Patient requesting x-rays and these were obtained and will be over read by radiology -Encouraged frequent deep  breathing to avoid atelectasis/pneumonia -Recommend trial of tramadol 1-2 every 6 hours as needed for severe pain and may supplement with Tylenol -She is aware this may take several weeks to improve in terms of her pain  Eulas Post MD [facility]

## 2019-09-29 ENCOUNTER — Telehealth: Payer: Self-pay | Admitting: Internal Medicine

## 2019-09-29 NOTE — Telephone Encounter (Signed)
Patient is calling regarding medication for a fractured rib traMADol (ULTRAM) 50 MG tablet [id number] is not controlling the pain. Patient is requesting something stronger. Please advise.  Preferred [employer]

## 2019-09-29 NOTE — Telephone Encounter (Signed)
Please advise 

## 2019-09-30 ENCOUNTER — Encounter: Payer: Self-pay | Admitting: Family Medicine

## 2019-10-01 NOTE — Telephone Encounter (Signed)
I am referring this message to DR Burchette who managed her visit  First. To see if he can be of help.  If  I need to prescribe medication then I would  Ask for a visit with me .

## 2019-10-02 ENCOUNTER — Telehealth: Payer: Self-pay

## 2019-10-02 MED ORDER — HYDROCODONE-ACETAMINOPHEN 5-325 MG PO TABS: 1.0000 | ORAL_TABLET | Freq: Four times a day (QID) | ORAL | 0 refills | Status: DC | PRN

## 2019-10-02 NOTE — Telephone Encounter (Signed)
Called patient and gave her the message and patient verbalized an understanding. 

## 2019-10-02 NOTE — Telephone Encounter (Signed)
[facility] called to report that xray from Friday she has a fracture of left rib please see xray and advise.

## 2019-10-02 NOTE — Telephone Encounter (Signed)
It appears she has taken hydrocodone in past without difficulty.  I sent in limited hydrocodone one every 6 hours prn- but this can cause sedation.

## 2019-10-02 NOTE — Telephone Encounter (Signed)
Already addressed

## 2019-10-16 ENCOUNTER — Other Ambulatory Visit: Payer: Self-pay | Admitting: Family Medicine

## 2019-10-16 NOTE — Telephone Encounter (Signed)
I will refill once but would look at trying to transition over to nonopioid pain treatment soon as her fractures hopefully will be healing soon

## 2019-10-16 NOTE — Telephone Encounter (Signed)
Dr. Regis Bill is out all week.

## 2019-10-25 ENCOUNTER — Other Ambulatory Visit: Payer: Self-pay | Admitting: Family Medicine

## 2019-10-25 NOTE — Telephone Encounter (Signed)
Dr. Regis Bill is out of the office

## 2019-10-25 NOTE — Telephone Encounter (Signed)
I strongly advise to transition to Tylenol or Ibuprofen for her rib fractures.  Should be starting to improve some at this time.

## 2019-10-25 NOTE — Telephone Encounter (Signed)
Called patient and she stated that she is starting to feel better and she is asking for one more refill just to help her get through the transition.

## 2019-11-27 DIAGNOSIS — Z79899 Other long term (current) drug therapy: Secondary | ICD-10-CM | POA: Diagnosis not present

## 2019-11-27 DIAGNOSIS — F902 Attention-deficit hyperactivity disorder, combined type: Secondary | ICD-10-CM | POA: Diagnosis not present

## 2019-12-01 ENCOUNTER — Ambulatory Visit: Payer: 59

## 2019-12-09 ENCOUNTER — Ambulatory Visit: Payer: 59

## 2019-12-26 IMAGING — DX DG RIBS 2V*R*
4 series · 4 of 4 positions shown · non-contrast
Comparison: November 22, 2018.

CLINICAL DATA: Rib pain status post fall

EXAM:
RIGHT RIBS - 2 VIEW

[hemithorax (ribs) ap (1 of 2)]
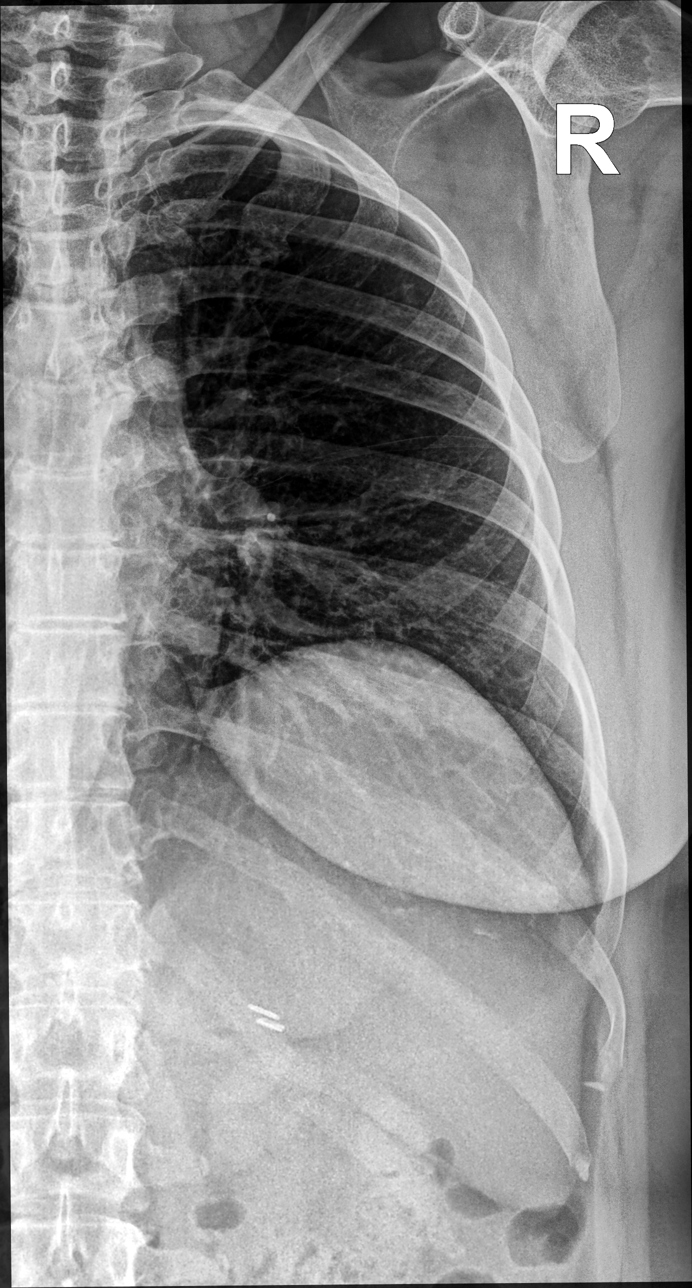

[hemithorax (ribs) mlo (1 of 2)]
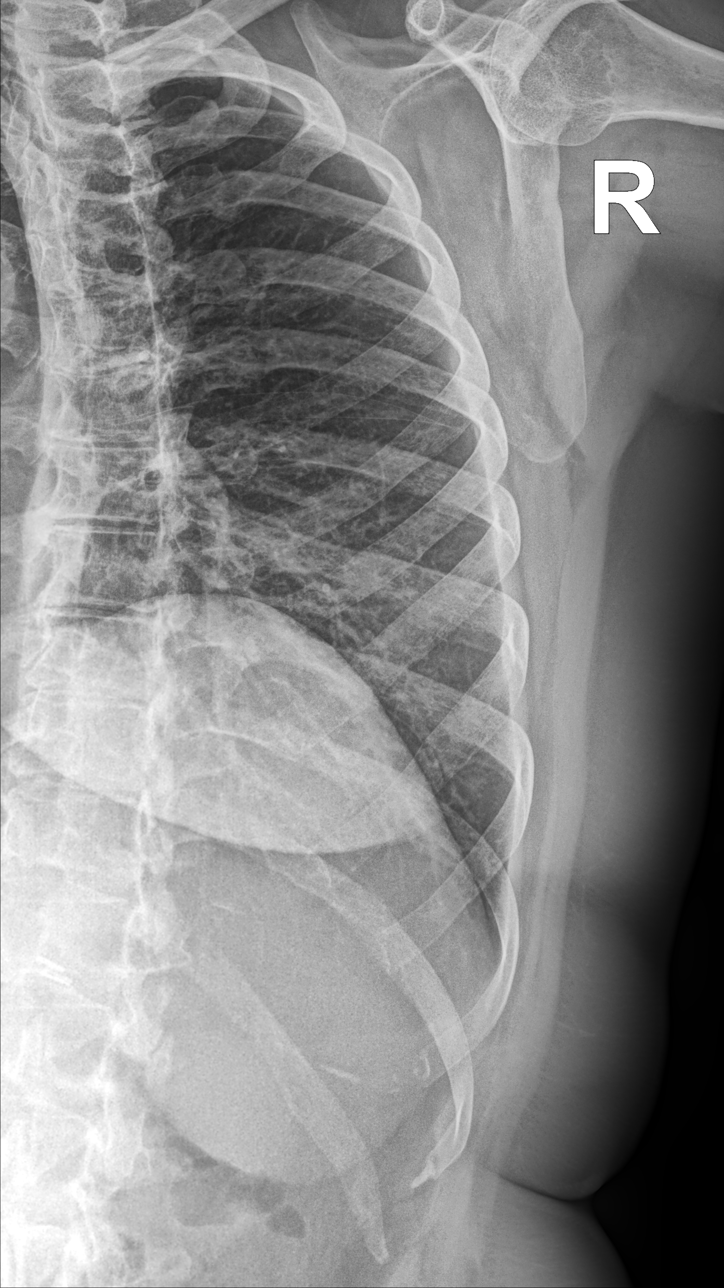

[hemithorax (ribs) ap (2 of 2)]
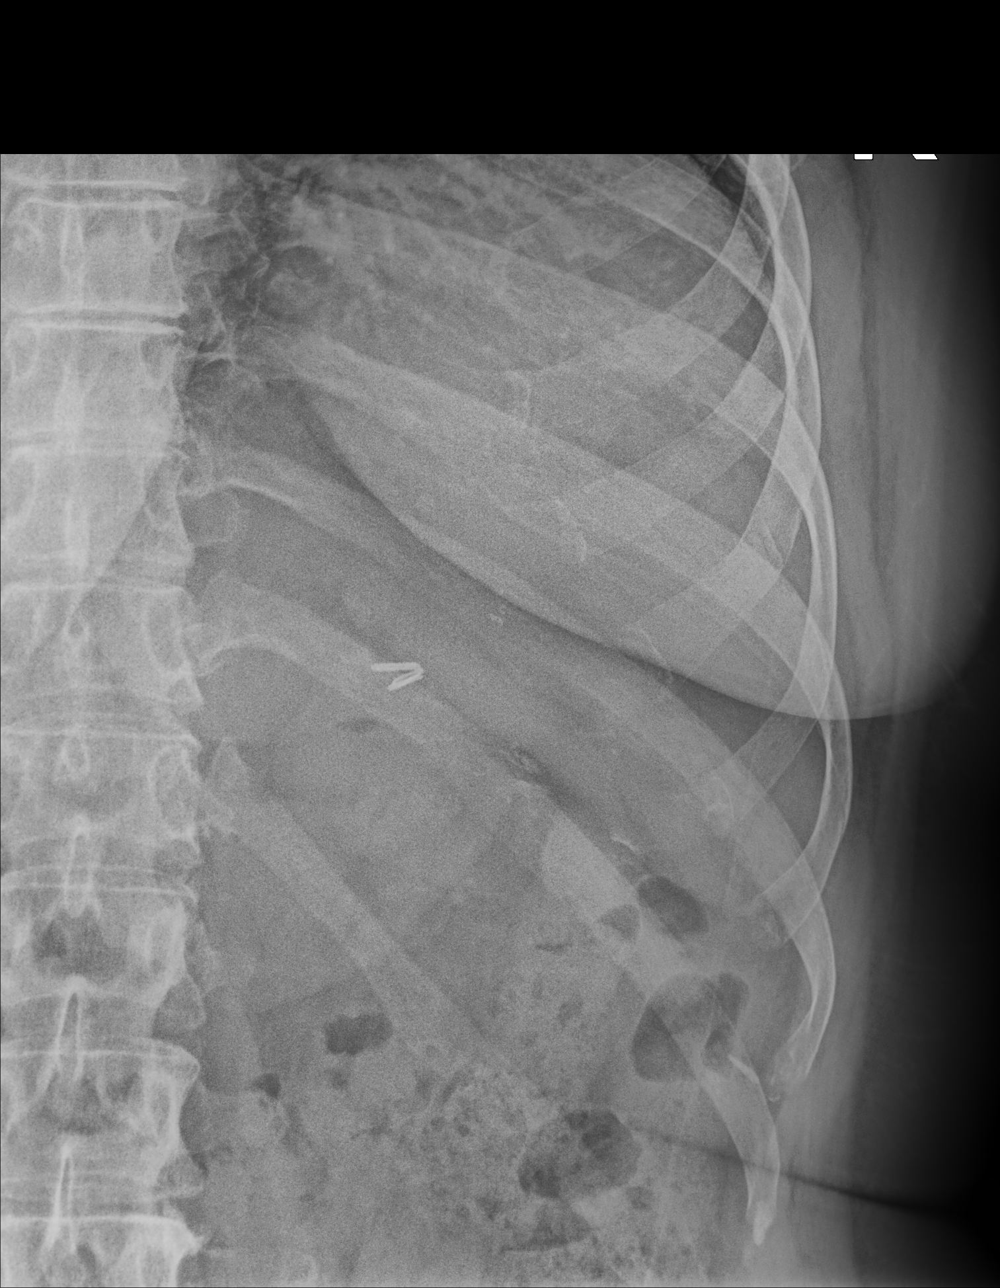

[hemithorax (ribs) mlo (2 of 2)]
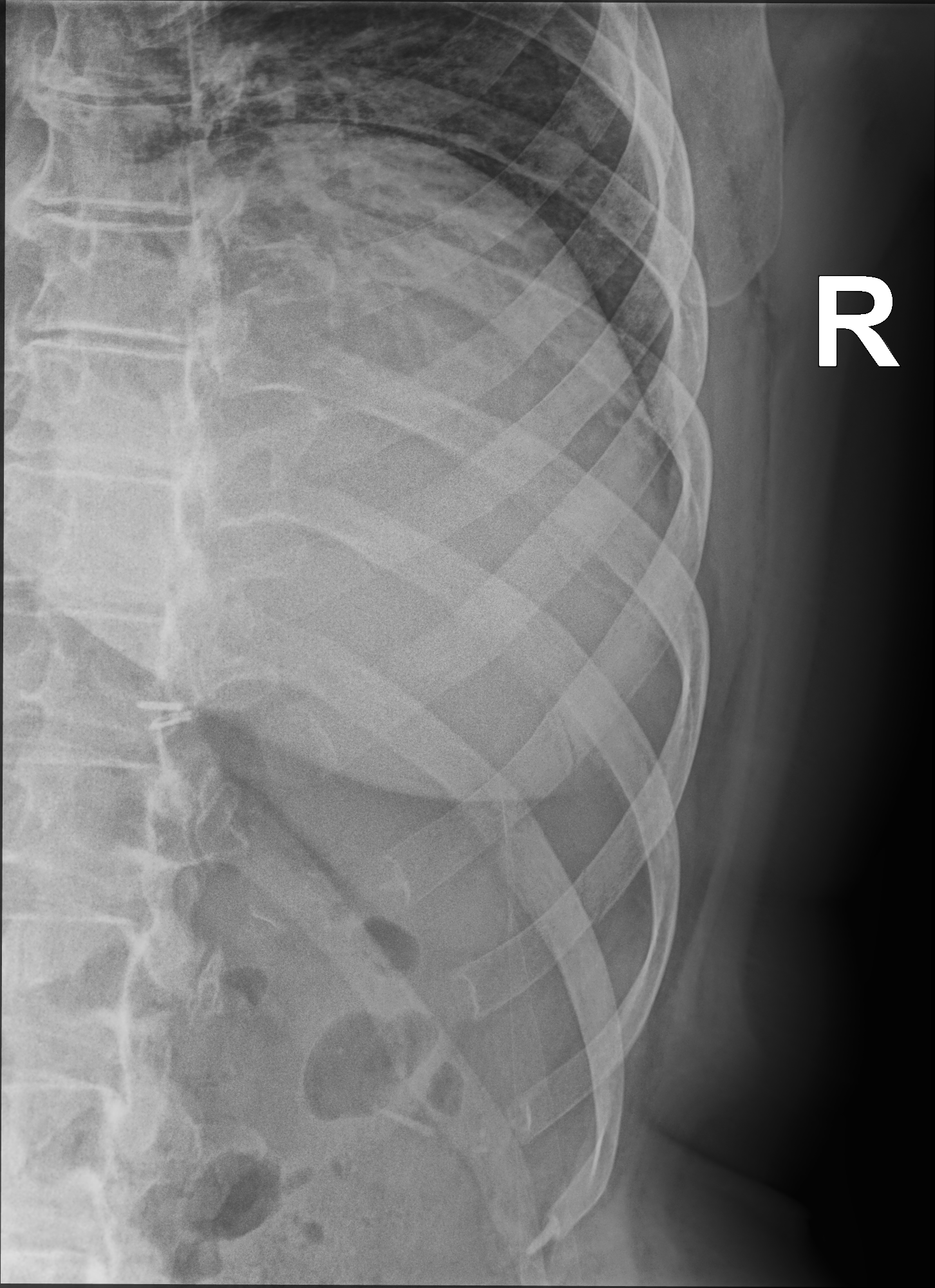

[4 of 4 positions shown; findings below may reference images not displayed]

FINDINGS: There is a nondisplaced fracture involving the anterior eighth rib
on the left. There is no left-sided pneumothorax. No obvious pleural
effusion, however evaluation is limited by technique.
IMPRESSION: Nondisplaced fracture involving the anterior left eighth rib. No
left-sided pneumothorax or obvious pleural effusion.

These results will be called to the ordering clinician or
representative by the Radiologist Assistant, and communication
documented in the PACS or zVision Dashboard.

## 2020-01-09 ENCOUNTER — Other Ambulatory Visit: Payer: Self-pay | Admitting: Neurology

## 2020-03-25 DIAGNOSIS — Z79899 Other long term (current) drug therapy: Secondary | ICD-10-CM | POA: Diagnosis not present

## 2020-03-25 DIAGNOSIS — F902 Attention-deficit hyperactivity disorder, combined type: Secondary | ICD-10-CM | POA: Diagnosis not present

## 2020-04-29 DIAGNOSIS — M25521 Pain in right elbow: Secondary | ICD-10-CM | POA: Diagnosis not present

## 2020-05-09 ENCOUNTER — Other Ambulatory Visit: Payer: Self-pay | Admitting: Neurology

## 2020-05-09 DIAGNOSIS — F5104 Psychophysiologic insomnia: Secondary | ICD-10-CM

## 2020-05-10 ENCOUNTER — Other Ambulatory Visit: Payer: Self-pay | Admitting: Neurology

## 2020-05-10 DIAGNOSIS — F5104 Psychophysiologic insomnia: Secondary | ICD-10-CM

## 2020-05-10 MED ORDER — ZOLPIDEM TARTRATE ER 12.5 MG PO TBCR: 12.5000 mg | EXTENDED_RELEASE_TABLET | Freq: Every evening | ORAL | 0 refills | Status: DC | PRN

## 2020-05-10 NOTE — Telephone Encounter (Signed)
Pt is needing a refill on her zolpidem (AMBIEN CR) 12.5 MG CR tablet sent in to the [facility] has scheduled a follow up appt.

## 2020-05-13 DIAGNOSIS — G5621 Lesion of ulnar nerve, right upper limb: Secondary | ICD-10-CM | POA: Diagnosis not present

## 2020-05-13 DIAGNOSIS — M25521 Pain in right elbow: Secondary | ICD-10-CM | POA: Diagnosis not present

## 2020-05-13 DIAGNOSIS — M7701 Medial epicondylitis, right elbow: Secondary | ICD-10-CM | POA: Diagnosis not present

## 2020-06-07 ENCOUNTER — Other Ambulatory Visit: Payer: Self-pay | Admitting: Neurology

## 2020-06-07 DIAGNOSIS — F5104 Psychophysiologic insomnia: Secondary | ICD-10-CM

## 2020-06-10 ENCOUNTER — Other Ambulatory Visit: Payer: Self-pay | Admitting: Neurology

## 2020-06-10 ENCOUNTER — Telehealth: Payer: Self-pay | Admitting: Neurology

## 2020-06-10 DIAGNOSIS — F5104 Psychophysiologic insomnia: Secondary | ICD-10-CM

## 2020-06-10 MED ORDER — ZOLPIDEM TARTRATE ER 12.5 MG PO TBCR: 12.5000 mg | EXTENDED_RELEASE_TABLET | Freq: Every evening | ORAL | 0 refills | Status: DC | PRN

## 2020-06-10 NOTE — Telephone Encounter (Signed)
I have routed this request to Dr Dohmeier for review. The pt is due for the medication and [place] registry was verified.  

## 2020-06-10 NOTE — Telephone Encounter (Signed)
Pt is requesting a refill for zolpidem (AMBIEN CR) 12.5 MG CR tablet.  Pharmacy: [facility]

## 2020-06-17 DIAGNOSIS — F902 Attention-deficit hyperactivity disorder, combined type: Secondary | ICD-10-CM | POA: Diagnosis not present

## 2020-06-17 DIAGNOSIS — F4010 Social phobia, unspecified: Secondary | ICD-10-CM | POA: Diagnosis not present

## 2020-06-17 DIAGNOSIS — Z79899 Other long term (current) drug therapy: Secondary | ICD-10-CM | POA: Diagnosis not present

## 2020-07-01 ENCOUNTER — Ambulatory Visit: Payer: 59 | Admitting: Neurology

## 2020-07-01 ENCOUNTER — Encounter: Payer: Self-pay | Admitting: Neurology

## 2020-07-01 ENCOUNTER — Other Ambulatory Visit: Payer: Self-pay

## 2020-07-01 DIAGNOSIS — F5104 Psychophysiologic insomnia: Secondary | ICD-10-CM

## 2020-07-01 MED ORDER — ZOLPIDEM TARTRATE ER 12.5 MG PO TBCR: 12.5000 mg | EXTENDED_RELEASE_TABLET | Freq: Every evening | ORAL | 2 refills | Status: DC | PRN

## 2020-07-01 NOTE — Patient Instructions (Signed)

## 2020-07-01 NOTE — Progress Notes (Signed)
[facility]   Provider:  Larey Seat, M D  Referring Provider: Burnis Medin, MD Primary Care Physician:  Burnis Medin, MD  Chief Complaint  Patient presents with  . Follow-up    pt alone, rm 10. presents as yearly follow up, states no major concerns.     HPI:  Tiffany Carson is a 54 y.o. female , seen here as a revisit  from Dr. Regis Bill for  Insomnia, 07-01-2020.  History of Present Illness: Tiffany Carson is a 54 year old patient with chronic insomnia, no longer with focal dystonia. Ambien dependent.  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  She does not have to provide night services or emergency call.  She intends to go to PA school.  Her current Epworth sleepiness score is endorsed at 0 points and fatigue severity at 12 points.  Her insomnia is still present and chronically so but she did adjust sleep habits and sleep hygiene and has been doing well with sleep overall but using Ambien.she has taken Trazodone to alternate with Ambien on weekends, clonidin is used on weekdays ( Dr. Johnnette Barrios, MD- ADHD specialist)   HPI:  Chronic Insomnia , but had been presenting with a task specific dystonia in 2015. Marland Kitchen Chief complaint according to patient : here today for Ambien refill. The patient reports that she is doing well on Ambien 12.5 in an extended release form, provided by Dr. Regis Bill. Meanwhile , our NP has taken tihs over.   The patient had undergone a sleep study which revealed no organic sleep disorder of significance. She is meanwhile 54 years old, she underwent a hysterectomy at age 57 and very likely went into menopause within the next 2 years. This was a time of exacerbation of insomnia. She still needs about an hour to fall asleep after taking Ambien. Her bedtime is usually around 11:30 PM so she may fall asleep anytime between midnight and 1 AM. She does not have nocturia breaks, she usually sleeps through until 4 AM when her  alarm rings. So she really doesn't gets a lot of sleep only about 4 hours with the help of medication each night. Her fatigue severity score was endorsed at 23 points the Epworth sleepiness score at 4 points both are below average not indicative of high fatigue or high sleepiness. She arrives at work at 6 AM. She takes rarely afternoon naps, if so- 30 minutes duration.  She was given Vyvanse for adult ADD, and has tolerated this well, no tics, no twitches. 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  She is aware that the continuous use of Ambien can lead to addiction. She had sleep eating spells on Ambien.   Social history: full tie gainfully employed, reviewed sleep habits, caffeine use 1 cup in AM.  No ETOH.  Non smoker.  Lives alone - wants to move her mother into her own home.  Pets; 2 dogs.     15th of August 2017, I have the pleasure of seeing Tiffany Carson today and her yearly revisit. The patient is taking Vyvanse for adult ADD as tolerated this well without any even incremental increase and dystonic muscle activity there is no myoclonusand no tic. She is using Ambien at night, has not reported new sleep walking episodes, no sleep eating episodes. The medicine is still working for her and she feels that she has a better quality of  life. She endorsed the Epworth score at 3 points and the fatigue severity score at 12 points. I will refill the medication today.  Social history: patient enrolled in pre- PA school for next year.   I saw Tiffany Carson in a video RV 02-2019. For refills after NP Hassell Done retired.    Review of Systems: Out of a complete 14 system review, the patient complains of only the following symptoms, and all other reviewed systems are negative.  How likely are you to doze in the following situations: 0 = not likely, 1 = slight chance, 2 = moderate chance, 3 = high chance  Sitting and Reading? Watching  Television? Sitting inactive in a public place (theater or meeting)? Lying down in the afternoon when circumstances permit? Sitting and talking to someone? Sitting quietly after lunch without alcohol? In a car, while stopped for a few minutes in traffic? As a passenger in a car for an hour without a break?  Total = Epworth score  0 , Fatigue severity score 12  , depression score 0   Social History   Socioeconomic History  . Marital status: Single    Spouse name: n/a  . Number of children: 0  . Years of education: 79  . Highest education level: Not on file  Occupational History  . Occupation: NUCLEAR MEDICINE    Employer: [employer]    Comment: [employer]  Tobacco Use  . Smoking status: Never Smoker  . Smokeless tobacco: Never Used  Vaping Use  . Vaping Use: Never used  Substance and Sexual Activity  . Alcohol use: Yes    Alcohol/week: 0.0 - 1.0 standard drinks    Comment: rarely  . Drug use: No  . Sexual activity: Never    Birth control/protection: None  Other Topics Concern  . Not on file  Social History Narrative   Lives alone, with 1 dog.   Cousins live nearby.  Siblings are in middle [place].   Neg td  ocass etoh 1 caffeine per day    40 hours  Per week.    Patient is right-handed.   Patient has a Haematologist.         Social Determinants of Health   Financial Resource Strain:   . Difficulty of Paying Living Expenses: Not on file  Food Insecurity:   . Worried About Charity fundraiser in the Last Year: Not on file  . Ran Out of Food in the Last Year: Not on file  Transportation Needs:   . Lack of Transportation (Medical): Not on file  . Lack of Transportation (Non-Medical): Not on file  Physical Activity:   . Days of Exercise per Week: Not on file  . Minutes of Exercise per Session: Not on file  Stress:   . Feeling of Stress : Not on file  Social Connections:   . Frequency of Communication with Friends and  Family: Not on file  . Frequency of Social Gatherings with Friends and Family: Not on file  . Attends Religious Services: Not on file  . Active Member of Clubs or Organizations: Not on file  . Attends Archivist Meetings: Not on file  . Marital Status: Not on file  Intimate Partner Violence:   . Fear of Current or Ex-Partner: Not on file  . Emotionally Abused: Not on file  . Physically Abused: Not on file  . Sexually Abused: Not on file    Family History  Problem Relation  Age of Onset  . Diabetes Mother   . Hypertension Mother   . Diabetes Brother 11       2013, type 2  . Arthritis Other   . Hypertension Other   . Colon cancer Neg Hx     Past Medical History:  Diagnosis Date  . ABDOMINAL PAIN RIGHT LOWER QUADRANT 10/03/2010  . Acquired absence of both cervix and uterus 10/03/2010  . ADHD (attention deficit hyperactivity disorder)    per dr Johnnye Sima  see notes  . ANEMIA, IRON DEFICIENCY 07/02/2010  . COUGH, CHRONIC 04/09/2010   pt denies  . FATIGUE 06/24/2009  . FIBROIDS, UTERUS 09/03/2010  . GERD 08/08/2007  . HYPERLIPIDEMIA 08/08/2007  . Irregular menstrual cycle 06/24/2009  . LEG CRAMPS 06/24/2009  . LEG PAIN, RIGHT 10/03/2010  . OBESITY 06/24/2009  . SLEEPLESSNESS 11/16/2007  . TINGLING 10/03/2010  . TREMOR 07/02/2010  . VITAMIN D DEFICIENCY 06/02/2010    Past Surgical History:  Procedure Laterality Date  . ABDOMINAL HYSTERECTOMY  nov 2011   secondary infection  had adhesions  . BREAST REDUCTION SURGERY  09 20 12  . BREAST SURGERY    . CHOLECYSTECTOMY    . CYST REMOVAL HAND     right hand 2019  . OOPHORECTOMY     left   salpingesctomy   . REDUCTION MAMMAPLASTY Bilateral 07/2011  . TRIGGER FINGER RELEASE  08/2018   right middle finger and excision of ganglion cyst of right wrist    Current Outpatient Medications  Medication Sig Dispense Refill  . cloNIDine (CATAPRES) 0.1 MG tablet Take 0.1-0.2 mg by mouth daily.   2  . ibuprofen (ADVIL,MOTRIN) 200 MG  tablet Take 200 mg by mouth every 6 (six) hours as needed.      . Omega-3 Fatty Acids (FISH OIL) 1000 MG CPDR Take 1 tablet by mouth daily.    . traZODone (DESYREL) 50 MG tablet TAKE 1 TABLET BY MOUTH AT BEDTIME AS NEEDED FOR SLEEP. 30 tablet 5  . Vitamin D, Cholecalciferol, 400 UNITS CHEW Chew by mouth.     Marland Kitchen VYVANSE 20 MG capsule Take 20 mg by mouth daily at 3 pm.  0  . VYVANSE 40 MG capsule Take 40 mg by mouth every morning.  0  . zolpidem (AMBIEN CR) 12.5 MG CR tablet Take 1 tablet (12.5 mg total) by mouth at bedtime as needed. 30 tablet 0   No current facility-administered medications for this visit.    Allergies as of 07/01/2020 - Review Complete 07/01/2020  Allergen Reaction Noted  . Percocet [oxycodone-acetaminophen] Itching 09/27/2019    Vitals: BP 120/80   Pulse 92   Ht 5\' 3"  (1.6 m)   Wt 181 lb (82.1 kg)   BMI 32.06 kg/m  Last Weight:  Wt Readings from Last 1 Encounters:  07/01/20 181 lb (82.1 kg)   QIH:KVQQ mass index is 32.06 kg/m.     Last Height:   Ht Readings from Last 1 Encounters:  07/01/20 5\' 3"  (1.6 m)    Physical exam:  General: The patient is awake, alert and appears not in acute distress. The patient is well groomed. Head: Normocephalic, atraumatic. Neck is supple. Mallampati 3,  neck circumference: 15. Nasal airflow seasonally restricted , TMJ is not  evident . Retrognathia is not seen.  Cardiovascular:  Regular rate and rhythm , without  murmurs or carotid bruit, and without distended neck veins. Respiratory: Lungs are clear to auscultation. Skin:  Without evidence of edema, or rash Trunk: BMI is  elevated . The patient's posture is erect.  Neurologic exam : The patient is awake and alert, oriented to place and time.    Attention span & concentration ability appears normal.  Speech is fluent,  without  dysarthria, dysphonia or aphasia. Mood and affect are appropriate.  Cranial nerves: longstanding loss of smell and taste - not COVID related.    Fully vaccinated.  Pupils are equal and briskly reactive to light.  Extraocular movements  in vertical and horizontal planes intact .Hearing to finger rub intact.  Facial motor strength is symmetric and tongue and uvula move midline. Shoulder shrug was symmetrical.    The patient was advised of the nature of the diagnosed  disorder , the treatment options and risks for general a health and wellness arising from not treating the condition.  I spent more than 20 minutes of face to face time with the patient. We have discussed the diagnosis and differential and I answered the patient's questions.     Assessment:  After physical and neurologic examination, review of laboratory studies,  Personal review of imaging studies, reports of other /same  Imaging studies ,  Results of polysomnography/ neurophysiology testing and pre-existing records as far as provided in visit., my assessment is   1) chronic insomnia- uses Ambien 12.5 mg prn.    Rv once a year for Ambien with NP.    Asencion Partridge Cova Knieriem MD  07/01/2020   CC: Burnis Medin, [address] [place],  [place] [id number]

## 2020-07-08 ENCOUNTER — Other Ambulatory Visit (HOSPITAL_COMMUNITY): Payer: Self-pay | Admitting: Physician Assistant

## 2020-09-07 ENCOUNTER — Other Ambulatory Visit: Payer: Self-pay | Admitting: Neurology

## 2020-09-09 DIAGNOSIS — Z79899 Other long term (current) drug therapy: Secondary | ICD-10-CM | POA: Diagnosis not present

## 2020-09-09 DIAGNOSIS — F4010 Social phobia, unspecified: Secondary | ICD-10-CM | POA: Diagnosis not present

## 2020-09-09 DIAGNOSIS — F902 Attention-deficit hyperactivity disorder, combined type: Secondary | ICD-10-CM | POA: Diagnosis not present

## 2020-09-11 ENCOUNTER — Other Ambulatory Visit: Payer: Self-pay | Admitting: Neurology

## 2020-10-03 ENCOUNTER — Other Ambulatory Visit (HOSPITAL_COMMUNITY): Payer: Self-pay | Admitting: Internal Medicine

## 2020-10-08 ENCOUNTER — Other Ambulatory Visit: Payer: Self-pay | Admitting: Neurology

## 2020-10-08 DIAGNOSIS — F5104 Psychophysiologic insomnia: Secondary | ICD-10-CM

## 2020-10-09 ENCOUNTER — Other Ambulatory Visit: Payer: Self-pay | Admitting: Neurology

## 2021-01-13 ENCOUNTER — Other Ambulatory Visit (HOSPITAL_COMMUNITY): Payer: Self-pay | Admitting: Internal Medicine

## 2021-01-27 ENCOUNTER — Other Ambulatory Visit (HOSPITAL_COMMUNITY): Payer: Self-pay | Admitting: Physician Assistant

## 2021-01-27 DIAGNOSIS — Z79899 Other long term (current) drug therapy: Secondary | ICD-10-CM | POA: Diagnosis not present

## 2021-01-27 DIAGNOSIS — F902 Attention-deficit hyperactivity disorder, combined type: Secondary | ICD-10-CM | POA: Diagnosis not present

## 2021-02-28 ENCOUNTER — Other Ambulatory Visit (HOSPITAL_COMMUNITY): Payer: Self-pay

## 2021-02-28 MED FILL — Lisdexamfetamine Dimesylate Cap 20 MG: ORAL | 30 days supply | Qty: 30 | Fill #0 | Status: AC

## 2021-02-28 MED FILL — Lisdexamfetamine Dimesylate Cap 40 MG: ORAL | 30 days supply | Qty: 30 | Fill #0 | Status: AC

## 2021-02-28 MED FILL — Trazodone HCl Tab 50 MG: ORAL | 30 days supply | Qty: 30 | Fill #0 | Status: AC

## 2021-02-28 MED FILL — Zolpidem Tartrate Tab ER 12.5 MG: ORAL | 30 days supply | Qty: 30 | Fill #0 | Status: AC

## 2021-03-28 ENCOUNTER — Other Ambulatory Visit: Payer: Self-pay | Admitting: Neurology

## 2021-03-28 ENCOUNTER — Other Ambulatory Visit (HOSPITAL_COMMUNITY): Payer: Self-pay

## 2021-03-28 DIAGNOSIS — F5104 Psychophysiologic insomnia: Secondary | ICD-10-CM

## 2021-03-29 ENCOUNTER — Other Ambulatory Visit (HOSPITAL_COMMUNITY): Payer: Self-pay

## 2021-03-29 MED ORDER — TRAZODONE HCL 50 MG PO TABS
50.0000 mg | ORAL_TABLET | Freq: Every evening | ORAL | 5 refills | Status: DC | PRN
  Filled 2021-03-29: qty 30, 30d supply, fill #0
  Filled 2021-04-30: qty 30, 30d supply, fill #1
  Filled 2021-05-27: qty 30, 30d supply, fill #2
  Filled 2021-06-23: qty 30, 30d supply, fill #3
  Filled 2021-07-21: qty 30, 30d supply, fill #4
  Filled 2021-09-12: qty 30, 30d supply, fill #5

## 2021-04-01 ENCOUNTER — Other Ambulatory Visit (HOSPITAL_COMMUNITY): Payer: Self-pay

## 2021-04-01 ENCOUNTER — Other Ambulatory Visit: Payer: Self-pay | Admitting: Neurology

## 2021-04-01 DIAGNOSIS — F5104 Psychophysiologic insomnia: Secondary | ICD-10-CM

## 2021-04-02 ENCOUNTER — Telehealth: Payer: 59 | Admitting: Physician Assistant

## 2021-04-02 ENCOUNTER — Other Ambulatory Visit (HOSPITAL_COMMUNITY): Payer: Self-pay

## 2021-04-02 DIAGNOSIS — R300 Dysuria: Secondary | ICD-10-CM | POA: Diagnosis not present

## 2021-04-02 MED ORDER — CEPHALEXIN 500 MG PO CAPS
500.0000 mg | ORAL_CAPSULE | Freq: Two times a day (BID) | ORAL | 0 refills | Status: AC
  Filled 2021-04-02: qty 14, 7d supply, fill #0

## 2021-04-02 MED FILL — Lisdexamfetamine Dimesylate Cap 40 MG: ORAL | 30 days supply | Qty: 30 | Fill #0 | Status: AC

## 2021-04-02 MED FILL — Lisdexamfetamine Dimesylate Cap 20 MG: ORAL | 30 days supply | Qty: 30 | Fill #0 | Status: AC

## 2021-04-02 NOTE — Progress Notes (Signed)
I have spent 5 minutes in review of e-visit questionnaire, review and updating patient chart, medical decision making and response to patient.   Alisha Bacus Cody Konnar Ben, PA-C    

## 2021-04-02 NOTE — Progress Notes (Signed)

## 2021-04-03 ENCOUNTER — Other Ambulatory Visit: Payer: Self-pay | Admitting: Neurology

## 2021-04-03 ENCOUNTER — Other Ambulatory Visit (HOSPITAL_COMMUNITY): Payer: Self-pay

## 2021-04-03 ENCOUNTER — Telehealth: Payer: Self-pay | Admitting: Neurology

## 2021-04-03 DIAGNOSIS — F5104 Psychophysiologic insomnia: Secondary | ICD-10-CM

## 2021-04-03 MED ORDER — ZOLPIDEM TARTRATE ER 12.5 MG PO TBCR
12.5000 mg | EXTENDED_RELEASE_TABLET | Freq: Every evening | ORAL | 2 refills | Status: DC | PRN
  Filled 2021-04-03: qty 30, 30d supply, fill #0
  Filled 2021-04-30: qty 30, 30d supply, fill #1
  Filled 2021-05-27: qty 30, 30d supply, fill #2

## 2021-04-03 NOTE — Telephone Encounter (Signed)
Pt is needing a refill on her zolpidem (AMBIEN CR) 12.5 MG CR tablet sent in to the [facility]

## 2021-04-03 NOTE — Telephone Encounter (Signed)
I have routed this request to Dr Dohmeier for review. The pt is due for the medication and [facility] registry was verified.  

## 2021-04-04 ENCOUNTER — Other Ambulatory Visit (HOSPITAL_COMMUNITY): Payer: Self-pay

## 2021-04-30 ENCOUNTER — Other Ambulatory Visit (HOSPITAL_COMMUNITY): Payer: Self-pay

## 2021-04-30 MED FILL — Clonidine HCl Tab 0.1 MG: ORAL | 90 days supply | Qty: 180 | Fill #0 | Status: AC

## 2021-05-19 ENCOUNTER — Ambulatory Visit: Payer: 59

## 2021-05-20 ENCOUNTER — Other Ambulatory Visit (HOSPITAL_BASED_OUTPATIENT_CLINIC_OR_DEPARTMENT_OTHER): Payer: Self-pay

## 2021-05-20 ENCOUNTER — Ambulatory Visit: Payer: 59

## 2021-05-27 ENCOUNTER — Other Ambulatory Visit (HOSPITAL_COMMUNITY): Payer: Self-pay

## 2021-05-30 ENCOUNTER — Other Ambulatory Visit (HOSPITAL_COMMUNITY): Payer: Self-pay

## 2021-05-30 DIAGNOSIS — F902 Attention-deficit hyperactivity disorder, combined type: Secondary | ICD-10-CM | POA: Diagnosis not present

## 2021-05-30 DIAGNOSIS — Z79899 Other long term (current) drug therapy: Secondary | ICD-10-CM | POA: Diagnosis not present

## 2021-05-30 MED ORDER — VYVANSE 20 MG PO CAPS
ORAL_CAPSULE | ORAL | 0 refills | Status: DC
  Filled 2021-05-30: qty 30, 30d supply, fill #0

## 2021-05-30 MED ORDER — VYVANSE 40 MG PO CAPS: ORAL_CAPSULE | ORAL | 0 refills | Status: DC

## 2021-05-30 MED ORDER — VYVANSE 40 MG PO CAPS
ORAL_CAPSULE | ORAL | 0 refills | Status: DC
  Filled 2021-05-30: qty 30, 30d supply, fill #0

## 2021-05-30 MED ORDER — VYVANSE 20 MG PO CAPS: ORAL_CAPSULE | ORAL | 0 refills | Status: DC

## 2021-06-20 ENCOUNTER — Telehealth: Payer: 59 | Admitting: Physician Assistant

## 2021-06-20 ENCOUNTER — Other Ambulatory Visit (HOSPITAL_COMMUNITY): Payer: Self-pay

## 2021-06-20 DIAGNOSIS — R3989 Other symptoms and signs involving the genitourinary system: Secondary | ICD-10-CM | POA: Diagnosis not present

## 2021-06-20 MED ORDER — CEPHALEXIN 500 MG PO CAPS
500.0000 mg | ORAL_CAPSULE | Freq: Two times a day (BID) | ORAL | 0 refills | Status: DC
  Filled 2021-06-20: qty 14, 7d supply, fill #0

## 2021-06-20 NOTE — Progress Notes (Signed)

## 2021-06-23 ENCOUNTER — Other Ambulatory Visit (HOSPITAL_COMMUNITY): Payer: Self-pay

## 2021-06-23 ENCOUNTER — Other Ambulatory Visit: Payer: Self-pay | Admitting: Neurology

## 2021-06-23 DIAGNOSIS — F5104 Psychophysiologic insomnia: Secondary | ICD-10-CM

## 2021-06-23 MED ORDER — ZOLPIDEM TARTRATE ER 12.5 MG PO TBCR
12.5000 mg | EXTENDED_RELEASE_TABLET | Freq: Every evening | ORAL | 0 refills | Status: DC | PRN
  Filled 2021-06-23: qty 30, 30d supply, fill #0

## 2021-07-10 NOTE — Patient Instructions (Signed)
Below is our plan:  We will continue Ambien CR 12.'5mg'$  daily at bedtime as needed. Do not take with trazodone and clonidine.   Please make sure you are staying well hydrated. I recommend 50-60 ounces daily. Well balanced diet and regular exercise encouraged. Consistent sleep schedule with 6-8 hours recommended.   Please continue follow up with care team as directed.   Follow up with Dr Roxy Horseman in 4-6 months   You may receive a survey regarding today's visit. I encourage you to leave honest feed back as I do use this information to improve patient care. Thank you for seeing me today!

## 2021-07-10 NOTE — Progress Notes (Signed)
Chief Complaint  Patient presents with   Follow-up    EMG RM 3, alone. Here for yearly f/u for insomnia. Pt reports no changes since last OV. Pt would like to discuss a new medication.      HISTORY OF PRESENT ILLNESS:  07/14/21 ALL:  Tiffany Carson is a 55 y.o. female here today for follow up for insomnia. She continues zolpidem 12.'5mg'$  QHS as needed (usually 15-20 nights a week). She is taking clonidine 0.'1mg'$  and trazodone '50mg'$  at night on nights when she is not using Ambien. She usually takes medication between 10-11pm. It usually takes her 30-40 minutes to go to sleep. She sets alarm for 4am. Alarm usually wakes her. She usually gets out of the bed around 4:45-5. She is getting 6-7 hours uninterrupted sleep. She reports feeling a little more stressed recently with her job and being a caregiver for her mother who moved in with her in 2020. She is cautious about using good sleep hygiene practices. She reports being on Ambien '15mg'$  daily in the past that she feels worked better for her. She has tried other sleep aids in the past that were not helpful.   She continues to follow with Dr Nancie Neas for [place]. She continues Vyvanse '40mg'$  in the mornings and '20mg'$  around 3pm. 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    HISTORY (copied from Dr Dohmeier's previous note)  Tiffany Carson is a 55 year old patient with chronic insomnia, no longer with focal dystonia. Ambien dependent.  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  She does not have to provide night services or emergency call.  She intends to go to PA school.  Her current Epworth sleepiness score is endorsed at 0 points and fatigue severity at 12 points.  Her insomnia is still present and chronically so but she did adjust sleep habits and sleep hygiene and has been doing well with sleep overall but using Ambien.she has taken Trazodone to alternate with Ambien on weekends,  clonidin is used on weekdays ( Dr. Johnnette Barrios, MD- ADHD specialist)     HPI:  Chronic Insomnia , but had been presenting with a task specific dystonia in 2015. Marland Kitchen Chief complaint according to patient : here today for Ambien refill. The patient reports that she is doing well on Ambien 12.5 in an extended release form, provided by Dr. Regis Bill. Meanwhile , our NP has taken tihs over.    The patient had undergone a sleep study which revealed no organic sleep disorder of significance. She is meanwhile 55 years old, she underwent a hysterectomy at age 64 and very likely went into menopause within the next 2 years. This was a time of exacerbation of insomnia. She still needs about an hour to fall asleep after taking Ambien. Her bedtime is usually around 11:30 PM so she may fall asleep anytime between midnight and 1 AM. She does not have nocturia breaks, she usually sleeps through until 4 AM when her alarm rings. So she really doesn't gets a lot of sleep only about 4 hours with the help of medication each night. Her fatigue severity score was endorsed at 23 points the Epworth sleepiness score at 4 points both are below average not indicative of high fatigue or high sleepiness. She arrives at work at 6 AM. She takes rarely afternoon naps, if so- 30 minutes duration.  She was given Vyvanse for adult ADD, and  has tolerated this well, no tics, no twitches. 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  She is aware that the continuous use of Ambien can lead to addiction. She had sleep eating spells on Ambien.    Social history: full tie gainfully employed, reviewed sleep habits, caffeine use 1 cup in AM.  No ETOH.  Non smoker.  Lives alone - wants to move her mother into her own home.  Pets; 2 dogs   REVIEW OF SYSTEMS: Out of a complete 14 system review of symptoms, the patient complains only of the following symptoms, insomnia, and all other reviewed  systems are negative.  ESS: 2 FSS: 23  ALLERGIES: Allergies  Allergen Reactions   Percocet [Oxycodone-Acetaminophen] Itching     HOME MEDICATIONS: Outpatient Medications Prior to Visit  Medication Sig Dispense Refill   cephALEXin (KEFLEX) 500 MG capsule Take 1 capsule (500 mg total) by mouth 2 (two) times daily. 14 capsule 0   cloNIDine (CATAPRES) 0.1 MG tablet Take 0.1-0.2 mg by mouth daily.   2   cloNIDine (CATAPRES) 0.1 MG tablet TAKE 1 TO 2 TABLETS BY MOUTH AT BEDTIME 180 tablet 3   ibuprofen (ADVIL,MOTRIN) 200 MG tablet Take 200 mg by mouth every 6 (six) hours as needed.       Omega-3 Fatty Acids (FISH OIL) 1000 MG CPDR Take 1 tablet by mouth daily.     traZODone (DESYREL) 50 MG tablet TAKE 1 TABLET BY MOUTH AT BEDTIME AS NEEDED FOR SLEEP. 30 tablet 5   Vitamin D, Cholecalciferol, 400 UNITS CHEW Chew by mouth.      zolpidem (AMBIEN CR) 12.5 MG CR tablet Take 1 tablet (12.5 mg total) by mouth at bedtime as needed for sleep. 30 tablet 0   lisdexamfetamine (VYVANSE) 20 MG capsule TAKE 1 CAPSULE BY MOUTH ONCE DAILY. DO NOT FILL UNTIL 02/26/21 30 capsule 0   lisdexamfetamine (VYVANSE) 20 MG capsule Take 1 capsule by mouth daily; May be filled 30 days after date prescribed 30 capsule 0   lisdexamfetamine (VYVANSE) 20 MG capsule Take 1 capsule by mouth daily; May be filled 60 days after date prescribed 30 capsule 0   lisdexamfetamine (VYVANSE) 40 MG capsule TAKE 1 CAPSULE BY MOUTH DAILY; MAY BE FILLED 03/28/21 30 capsule 0   lisdexamfetamine (VYVANSE) 40 MG capsule TAKE 1 CAPSULE BY MOUTH DAILY; MAY BE FILLED 02/26/21 30 capsule 0   lisdexamfetamine (VYVANSE) 40 MG capsule Take 1 capsule by mouth daily; May be filled 30 days after date prescribed 30 capsule 0   lisdexamfetamine (VYVANSE) 40 MG capsule Take 1 capsule by mouth daily; May be filled 60 days after date prescribed 30 capsule 0   VYVANSE 20 MG capsule Take 20 mg by mouth daily at 3 pm.  0   VYVANSE 20 MG capsule TAKE 1 CAPSULE  BY MOUTH ONCE DAILY 30 capsule 0   VYVANSE 20 MG capsule Take 1 capsule by mouth daily. 30 capsule 0   VYVANSE 40 MG capsule Take 40 mg by mouth every morning.  0   VYVANSE 40 MG capsule TAKE 1 CAPSULE BY MOUTH ONCE DAILY 30 capsule 0   VYVANSE 40 MG capsule Take 1 capsule by mouth daily. 30 capsule 0   lisdexamfetamine (VYVANSE) 20 MG capsule TAKE 1 CAPSULE BY MOUTH DAILY 30 capsule 0   lisdexamfetamine (VYVANSE) 40 MG capsule TAKE 1 CAPSULE BY MOUTH DAILY 30 capsule 0   lisdexamfetamine (VYVANSE) 20 MG capsule TAKE  1 CAPSULE BY MOUTH DAILY DO NOT FILL UNTIL 12/04/19 30 capsule 0   lisdexamfetamine (VYVANSE) 40 MG capsule TAKE 1 CAPSULE BY MOUTH DAILY DO NOT FILL UNTIL 12/03/19 30 capsule 0   lisdexamfetamine (VYVANSE) 40 MG capsule TAKE 1 CAPSULE BY MOUTH DAILY 30 capsule 0   VYVANSE 20 MG capsule TAKE 1 CAPSULE BY MOUTH DAILY 30 capsule 0   VYVANSE 40 MG capsule TAKE 1 CAPSULE BY MOUTH DAILY 30 capsule 0   No facility-administered medications prior to visit.     PAST MEDICAL HISTORY: Past Medical History:  Diagnosis Date   ABDOMINAL PAIN RIGHT LOWER QUADRANT 10/03/2010   Acquired absence of both cervix and uterus 10/03/2010   ADHD (attention deficit hyperactivity disorder)    per dr Johnnye Sima  see notes   ANEMIA, IRON DEFICIENCY 07/02/2010   COUGH, CHRONIC 04/09/2010   pt denies   FATIGUE 06/24/2009   FIBROIDS, UTERUS 09/03/2010   GERD 08/08/2007   HYPERLIPIDEMIA 08/08/2007   Irregular menstrual cycle 06/24/2009   LEG CRAMPS 06/24/2009   LEG PAIN, RIGHT 10/03/2010   OBESITY 06/24/2009   SLEEPLESSNESS 11/16/2007   TINGLING 10/03/2010   TREMOR 07/02/2010   VITAMIN D DEFICIENCY 06/02/2010     PAST SURGICAL HISTORY: Past Surgical History:  Procedure Laterality Date   ABDOMINAL HYSTERECTOMY  nov 2011   secondary infection  had adhesions   BREAST REDUCTION SURGERY  09 20 12   BREAST SURGERY     CHOLECYSTECTOMY     CYST REMOVAL HAND     right hand 2019   OOPHORECTOMY     left    salpingesctomy    REDUCTION MAMMAPLASTY Bilateral 07/2011   TRIGGER FINGER RELEASE  08/2018   right middle finger and excision of ganglion cyst of right wrist     FAMILY HISTORY: Family History  Problem Relation Age of Onset   Diabetes Mother    Hypertension Mother    Diabetes Brother 52       2013, type 2   Arthritis Other    Hypertension Other    Colon cancer Neg Hx      SOCIAL HISTORY: Social History   Socioeconomic History   Marital status: Single    Spouse name: n/a   Number of children: 0   Years of education: 16   Highest education level: Not on file  Occupational History   Occupation: NUCLEAR MEDICINE    Employer: [employer]    Comment: [employer]  Tobacco Use   Smoking status: Never   Smokeless tobacco: Never  Vaping Use   Vaping Use: Never used  Substance and Sexual Activity   Alcohol use: Yes    Alcohol/week: 0.0 - 1.0 standard drinks    Comment: rarely   Drug use: No   Sexual activity: Never    Birth control/protection: None  Other Topics Concern   Not on file  Social History Narrative   Lives alone, with 1 dog.   Cousins live nearby.  Siblings are in middle [place].   Neg td  ocass etoh 1 caffeine per day    40 hours  Per week.    Patient is right-handed.   Patient has a Haematologist.         Social Determinants of Health   Financial Resource Strain: Not on file  Food Insecurity: Not on file  Transportation Needs: Not on file  Physical Activity: Not on file  Stress: Not on file  Social Connections: Not on  file  Intimate Partner Violence: Not on file     PHYSICAL EXAM  Vitals:   07/14/21 0951  BP: 124/82  Pulse: 93  Weight: 188 lb 8 oz (85.5 kg)  Height: '5\' 3"'$  (1.6 m)   Body mass index is 33.39 kg/m.   Generalized: Well developed, in no acute distress  Cardiology: normal rate and rhythm, no murmur auscultated  Respiratory: clear to auscultation bilaterally    Neurological  examination  Mentation: Alert oriented to time, place, history taking. Follows all commands speech and language fluent Cranial nerve II-XII: Pupils were equal round reactive to light. Extraocular movements were full, visual field were full on confrontational test. Facial sensation and strength were normal. Head turning and shoulder shrug  were normal and symmetric. Motor: The motor testing reveals 5 over 5 strength of all 4 extremities. Good symmetric motor tone is noted throughout.  Gait and station: Gait is normal.    DIAGNOSTIC DATA (LABS, IMAGING, TESTING) - I reviewed patient records, labs, notes, testing and imaging myself where available.  Lab Results  Component Value Date   WBC 10.3 11/22/2018   HGB 15.3 (H) 11/22/2018   HCT 45.4 11/22/2018   MCV 90.3 11/22/2018   PLT 426.0 (H) 11/22/2018      Component Value Date/Time   NA 139 11/22/2018 1616   K 4.6 11/22/2018 1616   CL 100 11/22/2018 1616   CO2 31 11/22/2018 1616   GLUCOSE 104 (H) 11/22/2018 1616   BUN 12 11/22/2018 1616   CREATININE 0.67 11/22/2018 1616   CALCIUM 10.2 11/22/2018 1616   PROT 7.2 12/27/2017 1422   ALBUMIN 4.6 12/27/2017 1422   AST 18 12/27/2017 1422   ALT 21 12/27/2017 1422   ALKPHOS 84 12/27/2017 1422   BILITOT 0.3 12/27/2017 1422   GFRNONAA 100.56 10/03/2010 0000   Lab Results  Component Value Date   CHOL 217 (H) 11/30/2018   HDL 58.50 11/30/2018   LDLCALC 135 (H) 11/30/2018   LDLDIRECT 135.4 06/27/2012   TRIG 114.0 11/30/2018   CHOLHDL 4 11/30/2018   Lab Results  Component Value Date   HGBA1C 6.1 11/22/2018   Lab Results  Component Value Date   [id number] 05/04/2016   Lab Results  Component Value Date   TSH 0.97 11/22/2018    No flowsheet data found.   No flowsheet data found.   ASSESSMENT AND PLAN  55 y.o. year old female  has a past medical history of ABDOMINAL PAIN RIGHT LOWER QUADRANT (10/03/2010), Acquired absence of both cervix and uterus (10/03/2010), ADHD  (attention deficit hyperactivity disorder), ANEMIA, IRON DEFICIENCY (07/02/2010), COUGH, CHRONIC (04/09/2010), FATIGUE (06/24/2009), FIBROIDS, UTERUS (09/03/2010), GERD (08/08/2007), HYPERLIPIDEMIA (08/08/2007), Irregular menstrual cycle (06/24/2009), LEG CRAMPS (06/24/2009), LEG PAIN, RIGHT (10/03/2010), OBESITY (06/24/2009), SLEEPLESSNESS (11/16/2007), TINGLING (10/03/2010), TREMOR (07/02/2010), and VITAMIN D DEFICIENCY (06/02/2010). here with    Chronic insomnia  Tiffany Carson continues Ambien CR 12.'5mg'$  QHS PRN. She is getting 6-7 hours of uninterrupted sleep but is curious if she would do better on increased dose of Ambien to '15mg'$  daily. She also takes clonidine 0.'1mg'$  and trazodone '50mg'$  PRN prescribed by Dr Johnnye Sima. I have reviewed sleep hygiene. Increased stress could be playing a role. Mindful based relaxation and regular exercise could be helpful. I do no recommend increasing dose of Ambien to '15mg'$  at this time. PDMP shows appropriate refills (about every 45-60 days), last filled 06/23/2021. She will request refills when due. I will have her follow up with Dr Brett Fairy at next visit to discuss other  options in her care plan as she has required higher doses of medications in the past. She will focus on healthy lifestyle habits. She will follow up in 3-4 months with Dr Brett Fairy.    No orders of the defined types were placed in this encounter.    No orders of the defined types were placed in this encounter.     Debbora Presto, MSN, FNP-C 07/14/2021, 11:06 AM  [facility] [address] [place], [place] [address] [phone]

## 2021-07-14 ENCOUNTER — Other Ambulatory Visit: Payer: Self-pay

## 2021-07-14 ENCOUNTER — Ambulatory Visit: Payer: 59 | Admitting: Family Medicine

## 2021-07-14 ENCOUNTER — Encounter: Payer: Self-pay | Admitting: Family Medicine

## 2021-07-14 VITALS — BP 124/82 | HR 93 | Ht 63.0 in | Wt 188.5 lb

## 2021-07-14 DIAGNOSIS — F5104 Psychophysiologic insomnia: Secondary | ICD-10-CM | POA: Diagnosis not present

## 2021-07-15 ENCOUNTER — Other Ambulatory Visit: Payer: Self-pay | Admitting: Internal Medicine

## 2021-07-15 ENCOUNTER — Other Ambulatory Visit (HOSPITAL_COMMUNITY): Payer: Self-pay

## 2021-07-16 ENCOUNTER — Other Ambulatory Visit (HOSPITAL_COMMUNITY): Payer: Self-pay

## 2021-07-16 MED ORDER — VYVANSE 40 MG PO CAPS
ORAL_CAPSULE | ORAL | 0 refills | Status: DC
  Filled 2021-07-16: qty 30, 30d supply, fill #0

## 2021-07-16 MED ORDER — VYVANSE 20 MG PO CAPS
ORAL_CAPSULE | ORAL | 0 refills | Status: DC
  Filled 2021-07-16: qty 30, 30d supply, fill #0

## 2021-07-16 MED ORDER — VYVANSE 40 MG PO CAPS
ORAL_CAPSULE | ORAL | 0 refills | Status: DC
  Filled 2021-07-16 – 2021-08-19 (×2): qty 30, 30d supply, fill #0

## 2021-07-16 MED ORDER — VYVANSE 20 MG PO CAPS
ORAL_CAPSULE | ORAL | 0 refills | Status: DC
  Filled 2021-08-19: qty 30, 30d supply, fill #0

## 2021-07-21 ENCOUNTER — Other Ambulatory Visit (HOSPITAL_COMMUNITY): Payer: Self-pay

## 2021-07-21 ENCOUNTER — Other Ambulatory Visit: Payer: Self-pay | Admitting: Neurology

## 2021-07-21 DIAGNOSIS — F5104 Psychophysiologic insomnia: Secondary | ICD-10-CM

## 2021-07-22 ENCOUNTER — Other Ambulatory Visit (HOSPITAL_COMMUNITY): Payer: Self-pay

## 2021-07-22 MED ORDER — ZOLPIDEM TARTRATE ER 12.5 MG PO TBCR
12.5000 mg | EXTENDED_RELEASE_TABLET | Freq: Every evening | ORAL | 1 refills | Status: DC | PRN
  Filled 2021-07-22: qty 90, 90d supply, fill #0
  Filled 2021-10-10 – 2021-10-18 (×2): qty 90, 90d supply, fill #1

## 2021-07-22 NOTE — Telephone Encounter (Signed)
Patient is up to date on her appts and is due for a refill on ambien. [place] Controlled Substance Registry checked and is appropriate.

## 2021-08-19 ENCOUNTER — Other Ambulatory Visit (HOSPITAL_COMMUNITY): Payer: Self-pay

## 2021-08-19 ENCOUNTER — Other Ambulatory Visit: Payer: Self-pay

## 2021-08-21 ENCOUNTER — Other Ambulatory Visit (HOSPITAL_COMMUNITY): Payer: Self-pay

## 2021-08-25 DIAGNOSIS — F902 Attention-deficit hyperactivity disorder, combined type: Secondary | ICD-10-CM | POA: Diagnosis not present

## 2021-08-25 DIAGNOSIS — Z79899 Other long term (current) drug therapy: Secondary | ICD-10-CM | POA: Diagnosis not present

## 2021-08-26 ENCOUNTER — Other Ambulatory Visit (HOSPITAL_COMMUNITY): Payer: Self-pay

## 2021-08-27 ENCOUNTER — Other Ambulatory Visit (HOSPITAL_COMMUNITY): Payer: Self-pay

## 2021-08-29 ENCOUNTER — Other Ambulatory Visit (HOSPITAL_COMMUNITY): Payer: Self-pay

## 2021-08-29 MED ORDER — CLONIDINE HCL 0.1 MG PO TABS
0.1000 mg | ORAL_TABLET | Freq: Every day | ORAL | 3 refills | Status: DC
  Filled 2021-08-29 – 2021-09-24 (×2): qty 180, 90d supply, fill #0
  Filled 2022-03-23: qty 180, 90d supply, fill #1
  Filled 2022-06-20: qty 180, 90d supply, fill #2

## 2021-09-08 ENCOUNTER — Other Ambulatory Visit (HOSPITAL_COMMUNITY): Payer: Self-pay

## 2021-09-10 ENCOUNTER — Other Ambulatory Visit (HOSPITAL_COMMUNITY): Payer: Self-pay

## 2021-09-10 MED ORDER — VYVANSE 40 MG PO CAPS
ORAL_CAPSULE | ORAL | 0 refills | Status: DC
  Filled 2021-09-24: qty 30, 30d supply, fill #0

## 2021-09-10 MED ORDER — VYVANSE 20 MG PO CAPS
ORAL_CAPSULE | ORAL | 0 refills | Status: DC
  Filled 2021-09-24: qty 30, 30d supply, fill #0

## 2021-09-10 MED ORDER — VYVANSE 20 MG PO CAPS
ORAL_CAPSULE | ORAL | 0 refills | Status: DC
  Filled 2021-10-29: qty 30, 30d supply, fill #0

## 2021-09-10 MED ORDER — VYVANSE 40 MG PO CAPS
ORAL_CAPSULE | ORAL | 0 refills | Status: DC
  Filled 2021-12-10: qty 30, 30d supply, fill #0

## 2021-09-10 MED ORDER — VYVANSE 40 MG PO CAPS
ORAL_CAPSULE | ORAL | 0 refills | Status: DC
  Filled 2021-10-29: qty 30, 30d supply, fill #0

## 2021-09-10 MED ORDER — VYVANSE 20 MG PO CAPS
ORAL_CAPSULE | ORAL | 0 refills | Status: DC
  Filled 2021-12-10: qty 30, 30d supply, fill #0

## 2021-09-12 ENCOUNTER — Other Ambulatory Visit (HOSPITAL_COMMUNITY): Payer: Self-pay

## 2021-09-24 ENCOUNTER — Other Ambulatory Visit (HOSPITAL_COMMUNITY): Payer: Self-pay

## 2021-10-10 ENCOUNTER — Other Ambulatory Visit (HOSPITAL_COMMUNITY): Payer: Self-pay

## 2021-10-10 MED ORDER — TRAZODONE HCL 50 MG PO TABS
50.0000 mg | ORAL_TABLET | Freq: Every evening | ORAL | 5 refills | Status: DC | PRN
  Filled 2021-10-10: qty 30, 30d supply, fill #0
  Filled 2022-03-23: qty 30, 30d supply, fill #1
  Filled 2022-05-25: qty 30, 30d supply, fill #2
  Filled 2022-07-02: qty 30, 30d supply, fill #3
  Filled 2022-09-23: qty 30, 30d supply, fill #4

## 2021-10-18 ENCOUNTER — Other Ambulatory Visit (HOSPITAL_COMMUNITY): Payer: Self-pay

## 2021-10-29 ENCOUNTER — Other Ambulatory Visit (HOSPITAL_COMMUNITY): Payer: Self-pay

## 2021-11-04 ENCOUNTER — Other Ambulatory Visit (HOSPITAL_COMMUNITY): Payer: Self-pay

## 2021-11-11 ENCOUNTER — Other Ambulatory Visit (HOSPITAL_COMMUNITY): Payer: Self-pay

## 2021-11-11 ENCOUNTER — Telehealth: Payer: 59 | Admitting: Family Medicine

## 2021-11-11 DIAGNOSIS — R300 Dysuria: Secondary | ICD-10-CM

## 2021-11-11 MED ORDER — NITROFURANTOIN MONOHYD MACRO 100 MG PO CAPS
100.0000 mg | ORAL_CAPSULE | Freq: Two times a day (BID) | ORAL | 0 refills | Status: AC
  Filled 2021-11-11: qty 10, 5d supply, fill #0

## 2021-11-11 NOTE — Progress Notes (Signed)

## 2021-12-10 ENCOUNTER — Other Ambulatory Visit (HOSPITAL_COMMUNITY): Payer: Self-pay

## 2021-12-15 ENCOUNTER — Encounter: Payer: Self-pay | Admitting: Neurology

## 2021-12-15 ENCOUNTER — Ambulatory Visit: Payer: 59 | Admitting: Neurology

## 2022-01-12 DIAGNOSIS — Z0289 Encounter for other administrative examinations: Secondary | ICD-10-CM

## 2022-01-14 ENCOUNTER — Other Ambulatory Visit (HOSPITAL_COMMUNITY): Payer: Self-pay

## 2022-01-14 ENCOUNTER — Other Ambulatory Visit: Payer: Self-pay | Admitting: Family Medicine

## 2022-01-14 DIAGNOSIS — F5104 Psychophysiologic insomnia: Secondary | ICD-10-CM

## 2022-01-14 MED ORDER — ZOLPIDEM TARTRATE ER 12.5 MG PO TBCR
12.5000 mg | EXTENDED_RELEASE_TABLET | Freq: Every evening | ORAL | 0 refills | Status: DC | PRN
  Filled 2022-01-14: qty 90, 90d supply, fill #0

## 2022-01-14 NOTE — Telephone Encounter (Signed)
Last OV was on 07/14/21.  ?Next OV is scheduled for 01/19/22 .  ?Last RX was written on 10/18/21 for 90 tabs.  ? ?[place] Drug Database has been reviewed.  ?

## 2022-01-15 ENCOUNTER — Other Ambulatory Visit (HOSPITAL_COMMUNITY): Payer: Self-pay

## 2022-01-16 ENCOUNTER — Other Ambulatory Visit (HOSPITAL_COMMUNITY): Payer: Self-pay

## 2022-01-19 ENCOUNTER — Ambulatory Visit: Payer: 59 | Admitting: Family Medicine

## 2022-01-19 ENCOUNTER — Encounter: Payer: Self-pay | Admitting: Family Medicine

## 2022-01-19 VITALS — BP 159/99 | HR 106 | Ht 63.0 in | Wt 187.5 lb

## 2022-01-19 DIAGNOSIS — F5104 Psychophysiologic insomnia: Secondary | ICD-10-CM | POA: Diagnosis not present

## 2022-01-19 NOTE — Patient Instructions (Signed)
Below is our plan: ? ?We will continue zolpidem 12.'5mg'$  daily as needed. Try to avoid taking daily of you can. Continue close follow up with PCP and psychiatry. Keep eye on BP at home.  ? ?Please make sure you are staying well hydrated. I recommend 50-60 ounces daily. Well balanced diet and regular exercise encouraged. Consistent sleep schedule with 6-8 hours recommended.  ? ?Please continue follow up with care team as directed.  ? ?Follow up with Dr Roddie Mc in 6 months  ? ?You may receive a survey regarding today's visit. I encourage you to leave honest feed back as I do use this information to improve patient care. Thank you for seeing me today!  ? ? ?

## 2022-01-19 NOTE — Progress Notes (Signed)
? ? ?Chief Complaint  ?Patient presents with  ? Follow-up  ?  RM 1, alone. Last seen 07/14/21.  No new sx. Getting about 6 hr of sleep per night on average.   ? ? ? ?HISTORY OF PRESENT ILLNESS: ? ?01/19/22 ALL:  ?Tiffany Carson returns for follow up for chronic insomnia. She continues zolpidem ER 12.'5mg'$  QHS PRN. She is also taking trazodone '50mg'$  QHS PRN and clonidine 0.'1mg'$  1-2 tablets QHS PRN prescribed by psychiatry. She reports that she takes trazodone and clonidine mostly during the week and zolpidem mostly on the weekends. She denies adverse effects. She is followed by PCP regularly. She reports BP is usually less than 130/80. She continues Vyvanse '40mg'$  in the am and '20mg'$  in the evenings. She feels that she is doing well.  ? ?07/14/2021 ALL:  ?Tiffany Carson is a 56 y.o. female here today for follow up for insomnia. She continues zolpidem 12.'5mg'$  QHS as needed (usually 15-20 nights a month). She is taking clonidine 0.'1mg'$  and trazodone '50mg'$  at night on nights when she is not using Ambien. She usually takes medication between 10-11pm. It usually takes her 30-40 minutes to go to sleep. She sets alarm for 4am. Alarm usually wakes her. She usually gets out of the bed around 4:45-5. She is getting 6-7 hours uninterrupted sleep. She reports feeling a little more stressed recently with her job and being a caregiver for her mother who moved in with her in 2020. She is cautious about using good sleep hygiene practices. She reports being on Ambien '15mg'$  daily in the past that she feels worked better for her. She has tried other sleep aids in the past that were not helpful.  ? ?She continues to follow with Dr Nancie Neas for [place]. She continues Vyvanse '40mg'$  in the mornings and '20mg'$  around 3pm. 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  ? ?HISTORY (copied from Dr Dohmeier's previous note) ? ?Tiffany Carson is a 56 year old patient with chronic insomnia, no longer with focal dystonia. Ambien  dependent.  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  ?She does not have to provide night services or emergency call.  She intends to go to PA school.  Her current Epworth sleepiness score is endorsed at 0 points and fatigue severity at 12 points.  Her insomnia is still present and chronically so but she did adjust sleep habits and sleep hygiene and has been doing well with sleep overall but using Ambien.she has taken Trazodone to alternate with Ambien on weekends, clonidin is used on weekdays ( Dr. Johnnette Barrios, MD- ADHD specialist) ?  ?HPI:  Chronic Insomnia , but had been presenting with a task specific dystonia in 2015. Marland Kitchen ?Chief complaint according to patient : here today for Ambien refill. The patient reports that she is doing well on Ambien 12.5 in an extended release form, provided by Dr. Regis Bill. Meanwhile , our NP has taken tihs over.  ?  ?The patient had undergone a sleep study which revealed no organic sleep disorder of significance. She is meanwhile 56 years old, she underwent a hysterectomy at age 54 and very likely went into menopause within the next 2 years. This was a time of exacerbation of insomnia. She still needs about an hour to fall asleep after taking Ambien. Her bedtime is usually around 11:30 PM so she may fall asleep anytime between midnight and 1 AM. She does not have nocturia breaks,  she usually sleeps through until 4 AM when her alarm rings. So she really doesn't gets a lot of sleep only about 4 hours with the help of medication each night. Her fatigue severity score was endorsed at 23 points the Epworth sleepiness score at 4 points both are below average not indicative of high fatigue or high sleepiness. She arrives at work at 6 AM. She takes rarely afternoon naps, if so- 30 minutes duration.  ?She was given Vyvanse for adult ADD, and has tolerated this well, no tics, no twitches. 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  ?She is aware that the continuous use of Ambien can lead to addiction. She had sleep eating spells on Ambien.  ?  ?Social history: full tie gainfully employed, reviewed sleep habits, caffeine use 1 cup in AM.  ?No ETOH.  ?Non smoker.  ?Lives alone - wants to move her mother into her own home.  ?Pets; 2 dogs ? ? ?REVIEW OF SYSTEMS: Out of a complete 14 system review of symptoms, the patient complains only of the following symptoms, insomnia, and all other reviewed systems are negative. ? ?ESS: 2 ?FSS: 23 ? ?ALLERGIES: ?Allergies  ?Allergen Reactions  ? Percocet [Oxycodone-Acetaminophen] Itching  ? ? ? ?HOME MEDICATIONS: ?Outpatient Medications Prior to Visit  ?Medication Sig Dispense Refill  ? cephALEXin (KEFLEX) 500 MG capsule Take 1 capsule (500 mg total) by mouth 2 (two) times daily. 14 capsule 0  ? cloNIDine (CATAPRES) 0.1 MG tablet TAKE 1 TO 2 TABLETS BY MOUTH AT BEDTIME 180 tablet 3  ? ibuprofen (ADVIL,MOTRIN) 200 MG tablet Take 200 mg by mouth every 6 (six) hours as needed.      ? Omega-3 Fatty Acids (FISH OIL) 1000 MG CPDR Take 1 tablet by mouth daily.    ? traZODone (DESYREL) 50 MG tablet TAKE 1 TABLET BY MOUTH AT BEDTIME AS NEEDED FOR SLEEP. 30 tablet 5  ? Vitamin D, Cholecalciferol, 400 UNITS CHEW Chew by mouth.     ? zolpidem (AMBIEN CR) 12.5 MG CR tablet Take 1 tablet by mouth at bedtime as needed for sleep. 90 tablet 0  ? cloNIDine (CATAPRES) 0.1 MG tablet Take 0.1-0.2 mg by mouth daily.   2  ? lisdexamfetamine (VYVANSE) 20 MG capsule Take 1 capsule by mouth daily (May be filled 30 days after date prescribed) 30 capsule 0  ? lisdexamfetamine (VYVANSE) 20 MG capsule Take 1 capsule by mouth daily (May be filled 60 days after date prescribed) 30 capsule 0  ? lisdexamfetamine (VYVANSE) 40 MG capsule Take 1 capsule by mouth daily (May be filled 60 days after date prescribed) 30 capsule 0  ? lisdexamfetamine (VYVANSE) 40 MG capsule Take 1 capsule by mouth daily (May be filled 30 days  after date prescribed) 30 capsule 0  ? VYVANSE 20 MG capsule Take 1 capsule by mouth daily 30 capsule 0  ? VYVANSE 20 MG capsule Take 1 capsule by mouth daily 30 capsule 0  ? VYVANSE 40 MG capsule Take 1 capsule by mouth daily 30 capsule 0  ? VYVANSE 40 MG capsule Take 1 capsule by mouth daily 30 capsule 0  ? lisdexamfetamine (VYVANSE) 20 MG capsule TAKE 1 CAPSULE BY MOUTH DAILY 30 capsule 0  ? lisdexamfetamine (VYVANSE) 40 MG capsule TAKE 1 CAPSULE BY MOUTH DAILY 30 capsule 0  ? cloNIDine (CATAPRES) 0.1 MG tablet TAKE 1 TO 2 TABLETS BY MOUTH AT BEDTIME 180 tablet 3  ? ?No  facility-administered medications prior to visit.  ? ? ? ?PAST MEDICAL HISTORY: ?Past Medical History:  ?Diagnosis Date  ? ABDOMINAL PAIN RIGHT LOWER QUADRANT 10/03/2010  ? Acquired absence of both cervix and uterus 10/03/2010  ? ADHD (attention deficit hyperactivity disorder)   ? per dr Johnnye Sima  see notes  ? ANEMIA, IRON DEFICIENCY 07/02/2010  ? COUGH, CHRONIC 04/09/2010  ? pt denies  ? FATIGUE 06/24/2009  ? FIBROIDS, UTERUS 09/03/2010  ? GERD 08/08/2007  ? HYPERLIPIDEMIA 08/08/2007  ? Irregular menstrual cycle 06/24/2009  ? LEG CRAMPS 06/24/2009  ? LEG PAIN, RIGHT 10/03/2010  ? OBESITY 06/24/2009  ? SLEEPLESSNESS 11/16/2007  ? TINGLING 10/03/2010  ? TREMOR 07/02/2010  ? VITAMIN D DEFICIENCY 06/02/2010  ? ? ? ?PAST SURGICAL HISTORY: ?Past Surgical History:  ?Procedure Laterality Date  ? ABDOMINAL HYSTERECTOMY  nov 2011  ? secondary infection  had adhesions  ? BREAST REDUCTION SURGERY  09 20 12  ? BREAST SURGERY    ? CHOLECYSTECTOMY    ? CYST REMOVAL HAND    ? right hand 2019  ? OOPHORECTOMY    ? left   salpingesctomy   ? REDUCTION MAMMAPLASTY Bilateral 07/2011  ? TRIGGER FINGER RELEASE  08/2018  ? right middle finger and excision of ganglion cyst of right wrist  ? ? ? ?FAMILY HISTORY: ?Family History  ?Problem Relation Age of Onset  ? Diabetes Mother   ? Hypertension Mother   ? Diabetes Brother 52  ?     2013, type 2  ? Arthritis Other   ? Hypertension Other   ?  Colon cancer Neg Hx   ? ? ? ?SOCIAL HISTORY: ?Social History  ? ?Socioeconomic History  ? Marital status: Single  ?  Spouse name: n/a  ? Number of children: 0  ? Years of education: 29  ? Highest education

## 2022-01-21 ENCOUNTER — Other Ambulatory Visit (HOSPITAL_COMMUNITY): Payer: Self-pay

## 2022-01-21 MED ORDER — VYVANSE 40 MG PO CAPS
40.0000 mg | ORAL_CAPSULE | Freq: Every day | ORAL | 0 refills | Status: DC
  Filled 2022-01-21: qty 30, 30d supply, fill #0

## 2022-01-21 MED ORDER — VYVANSE 20 MG PO CAPS
20.0000 mg | ORAL_CAPSULE | Freq: Every day | ORAL | 0 refills | Status: DC
  Filled 2022-01-21: qty 30, 30d supply, fill #0

## 2022-01-24 ENCOUNTER — Other Ambulatory Visit (HOSPITAL_COMMUNITY): Payer: Self-pay

## 2022-03-02 ENCOUNTER — Other Ambulatory Visit (HOSPITAL_COMMUNITY): Payer: Self-pay

## 2022-03-02 DIAGNOSIS — Z79899 Other long term (current) drug therapy: Secondary | ICD-10-CM | POA: Diagnosis not present

## 2022-03-02 DIAGNOSIS — F902 Attention-deficit hyperactivity disorder, combined type: Secondary | ICD-10-CM | POA: Diagnosis not present

## 2022-03-02 MED ORDER — VYVANSE 20 MG PO CAPS
20.0000 mg | ORAL_CAPSULE | Freq: Every day | ORAL | 0 refills | Status: AC
  Filled 2022-03-02: qty 30, 30d supply, fill #0

## 2022-03-02 MED ORDER — VYVANSE 40 MG PO CAPS
ORAL_CAPSULE | ORAL | 0 refills | Status: DC
  Filled 2022-05-16: qty 30, 30d supply, fill #0

## 2022-03-02 MED ORDER — VYVANSE 40 MG PO CAPS
ORAL_CAPSULE | ORAL | 0 refills | Status: DC
  Filled 2022-04-15: qty 30, 30d supply, fill #0

## 2022-03-02 MED ORDER — VYVANSE 40 MG PO CAPS
40.0000 mg | ORAL_CAPSULE | Freq: Every day | ORAL | 0 refills | Status: DC
  Filled 2022-03-02: qty 30, 30d supply, fill #0

## 2022-03-02 MED ORDER — VYVANSE 20 MG PO CAPS
ORAL_CAPSULE | ORAL | 0 refills | Status: AC
  Filled 2022-04-15: qty 30, 30d supply, fill #0

## 2022-03-02 MED ORDER — VYVANSE 20 MG PO CAPS
ORAL_CAPSULE | ORAL | 0 refills | Status: AC
  Filled 2022-05-16: qty 30, 30d supply, fill #0

## 2022-03-04 DIAGNOSIS — F902 Attention-deficit hyperactivity disorder, combined type: Secondary | ICD-10-CM | POA: Diagnosis not present

## 2022-03-04 DIAGNOSIS — Z79899 Other long term (current) drug therapy: Secondary | ICD-10-CM | POA: Diagnosis not present

## 2022-03-06 ENCOUNTER — Other Ambulatory Visit (HOSPITAL_COMMUNITY): Payer: Self-pay

## 2022-03-23 ENCOUNTER — Other Ambulatory Visit (HOSPITAL_COMMUNITY): Payer: Self-pay

## 2022-04-15 ENCOUNTER — Other Ambulatory Visit (HOSPITAL_COMMUNITY): Payer: Self-pay

## 2022-04-15 ENCOUNTER — Other Ambulatory Visit: Payer: Self-pay | Admitting: Family Medicine

## 2022-04-15 DIAGNOSIS — F5104 Psychophysiologic insomnia: Secondary | ICD-10-CM

## 2022-04-16 NOTE — Telephone Encounter (Signed)
Last OV was on 01/19/22.  Next OV is scheduled for 07/27/22.  Last RX was written on 01/15/22 for 90 tabs.    Drug Database has been reviewed.

## 2022-04-17 ENCOUNTER — Other Ambulatory Visit (HOSPITAL_COMMUNITY): Payer: Self-pay

## 2022-04-17 MED ORDER — ZOLPIDEM TARTRATE ER 12.5 MG PO TBCR
12.5000 mg | EXTENDED_RELEASE_TABLET | Freq: Every evening | ORAL | 0 refills | Status: DC | PRN
  Filled 2022-04-17: qty 90, 90d supply, fill #0

## 2022-05-16 ENCOUNTER — Other Ambulatory Visit (HOSPITAL_COMMUNITY): Payer: Self-pay

## 2022-05-25 ENCOUNTER — Other Ambulatory Visit (HOSPITAL_COMMUNITY): Payer: Self-pay

## 2022-06-20 ENCOUNTER — Other Ambulatory Visit (HOSPITAL_COMMUNITY): Payer: Self-pay

## 2022-06-22 ENCOUNTER — Other Ambulatory Visit (HOSPITAL_COMMUNITY): Payer: Self-pay

## 2022-06-23 ENCOUNTER — Other Ambulatory Visit (HOSPITAL_COMMUNITY): Payer: Self-pay

## 2022-06-23 MED ORDER — VYVANSE 20 MG PO CAPS
20.0000 mg | ORAL_CAPSULE | Freq: Every day | ORAL | 0 refills | Status: DC
  Filled 2022-09-23: qty 30, 30d supply, fill #0

## 2022-06-23 MED ORDER — VYVANSE 20 MG PO CAPS
20.0000 mg | ORAL_CAPSULE | Freq: Every day | ORAL | 0 refills | Status: DC
  Filled 2022-07-27: qty 30, 30d supply, fill #0

## 2022-06-23 MED ORDER — VYVANSE 40 MG PO CAPS
40.0000 mg | ORAL_CAPSULE | Freq: Every day | ORAL | 0 refills | Status: DC
  Filled 2022-07-27: qty 30, 30d supply, fill #0

## 2022-06-23 MED ORDER — VYVANSE 20 MG PO CAPS
20.0000 mg | ORAL_CAPSULE | Freq: Every day | ORAL | 0 refills | Status: DC
  Filled 2022-06-23: qty 30, 30d supply, fill #0

## 2022-06-23 MED ORDER — VYVANSE 40 MG PO CAPS
40.0000 mg | ORAL_CAPSULE | Freq: Every day | ORAL | 0 refills | Status: DC
  Filled 2022-06-23: qty 30, 30d supply, fill #0

## 2022-06-23 MED ORDER — VYVANSE 40 MG PO CAPS
40.0000 mg | ORAL_CAPSULE | Freq: Every day | ORAL | 0 refills | Status: DC
  Filled 2022-09-23: qty 30, 30d supply, fill #0

## 2022-06-24 ENCOUNTER — Other Ambulatory Visit (HOSPITAL_COMMUNITY): Payer: Self-pay

## 2022-06-26 ENCOUNTER — Other Ambulatory Visit (HOSPITAL_COMMUNITY): Payer: Self-pay

## 2022-06-28 DIAGNOSIS — Z0000 Encounter for general adult medical examination without abnormal findings: Secondary | ICD-10-CM | POA: Diagnosis not present

## 2022-07-02 ENCOUNTER — Other Ambulatory Visit (HOSPITAL_COMMUNITY): Payer: Self-pay

## 2022-07-03 ENCOUNTER — Other Ambulatory Visit (HOSPITAL_COMMUNITY): Payer: Self-pay

## 2022-07-15 ENCOUNTER — Other Ambulatory Visit (HOSPITAL_COMMUNITY): Payer: Self-pay

## 2022-07-15 ENCOUNTER — Other Ambulatory Visit: Payer: Self-pay | Admitting: Neurology

## 2022-07-15 DIAGNOSIS — F5104 Psychophysiologic insomnia: Secondary | ICD-10-CM

## 2022-07-16 ENCOUNTER — Other Ambulatory Visit (HOSPITAL_COMMUNITY): Payer: Self-pay

## 2022-07-16 MED ORDER — ZOLPIDEM TARTRATE ER 12.5 MG PO TBCR
12.5000 mg | EXTENDED_RELEASE_TABLET | Freq: Every evening | ORAL | 0 refills | Status: DC | PRN
  Filled 2022-07-16: qty 90, 90d supply, fill #0

## 2022-07-27 ENCOUNTER — Ambulatory Visit: Payer: 59 | Admitting: Neurology

## 2022-07-27 ENCOUNTER — Other Ambulatory Visit (HOSPITAL_COMMUNITY): Payer: Self-pay

## 2022-07-27 DIAGNOSIS — Z0289 Encounter for other administrative examinations: Secondary | ICD-10-CM

## 2022-08-01 ENCOUNTER — Other Ambulatory Visit (HOSPITAL_COMMUNITY): Payer: Self-pay

## 2022-08-03 ENCOUNTER — Encounter: Payer: Self-pay | Admitting: Neurology

## 2022-08-03 ENCOUNTER — Ambulatory Visit: Payer: 59 | Admitting: Neurology

## 2022-09-23 ENCOUNTER — Other Ambulatory Visit (HOSPITAL_COMMUNITY): Payer: Self-pay

## 2022-09-23 MED ORDER — CLONIDINE HCL 0.1 MG PO TABS
0.1000 mg | ORAL_TABLET | Freq: Every day | ORAL | 3 refills | Status: DC
  Filled 2022-09-23: qty 180, 90d supply, fill #0
  Filled 2023-01-06: qty 180, 90d supply, fill #1
  Filled 2023-04-22: qty 180, 90d supply, fill #2

## 2022-09-28 ENCOUNTER — Other Ambulatory Visit (HOSPITAL_COMMUNITY): Payer: Self-pay

## 2022-09-28 DIAGNOSIS — Z79899 Other long term (current) drug therapy: Secondary | ICD-10-CM | POA: Diagnosis not present

## 2022-09-28 DIAGNOSIS — F902 Attention-deficit hyperactivity disorder, combined type: Secondary | ICD-10-CM | POA: Diagnosis not present

## 2022-09-28 MED ORDER — LISDEXAMFETAMINE DIMESYLATE 40 MG PO CAPS
40.0000 mg | ORAL_CAPSULE | Freq: Every day | ORAL | 0 refills | Status: DC
  Filled 2022-09-28 – 2022-11-10 (×2): qty 30, 30d supply, fill #0

## 2022-09-28 MED ORDER — LISDEXAMFETAMINE DIMESYLATE 40 MG PO CAPS
40.0000 mg | ORAL_CAPSULE | Freq: Every day | ORAL | 0 refills | Status: DC
  Filled 2022-09-28 – 2023-01-06 (×2): qty 30, 30d supply, fill #0

## 2022-09-28 MED ORDER — LISDEXAMFETAMINE DIMESYLATE 20 MG PO CAPS
20.0000 mg | ORAL_CAPSULE | Freq: Every day | ORAL | 0 refills | Status: DC
  Filled 2022-09-28 – 2023-01-06 (×2): qty 30, 30d supply, fill #0

## 2022-09-28 MED ORDER — LISDEXAMFETAMINE DIMESYLATE 40 MG PO CAPS
40.0000 mg | ORAL_CAPSULE | Freq: Every day | ORAL | 0 refills | Status: DC
  Filled 2022-09-28 – 2023-02-17 (×3): qty 30, 30d supply, fill #0

## 2022-09-28 MED ORDER — LISDEXAMFETAMINE DIMESYLATE 20 MG PO CAPS
20.0000 mg | ORAL_CAPSULE | Freq: Every day | ORAL | 0 refills | Status: DC
  Filled 2022-09-28 – 2023-02-17 (×2): qty 30, 30d supply, fill #0

## 2022-09-28 MED ORDER — LISDEXAMFETAMINE DIMESYLATE 20 MG PO CAPS
20.0000 mg | ORAL_CAPSULE | Freq: Every day | ORAL | 0 refills | Status: DC
  Filled 2022-09-28 – 2022-11-10 (×2): qty 30, 30d supply, fill #0

## 2022-10-08 DIAGNOSIS — F902 Attention-deficit hyperactivity disorder, combined type: Secondary | ICD-10-CM | POA: Diagnosis not present

## 2022-10-21 ENCOUNTER — Other Ambulatory Visit (HOSPITAL_COMMUNITY): Payer: Self-pay

## 2022-10-21 ENCOUNTER — Other Ambulatory Visit: Payer: Self-pay | Admitting: Family Medicine

## 2022-10-21 DIAGNOSIS — F5104 Psychophysiologic insomnia: Secondary | ICD-10-CM

## 2022-10-22 ENCOUNTER — Other Ambulatory Visit (HOSPITAL_COMMUNITY): Payer: Self-pay

## 2022-10-22 MED ORDER — ZOLPIDEM TARTRATE ER 12.5 MG PO TBCR
12.5000 mg | EXTENDED_RELEASE_TABLET | Freq: Every evening | ORAL | 0 refills | Status: DC | PRN
  Filled 2022-10-22: qty 30, 30d supply, fill #0
  Filled 2022-11-19: qty 60, 60d supply, fill #1

## 2022-11-05 DIAGNOSIS — H524 Presbyopia: Secondary | ICD-10-CM | POA: Diagnosis not present

## 2022-11-05 DIAGNOSIS — H353131 Nonexudative age-related macular degeneration, bilateral, early dry stage: Secondary | ICD-10-CM | POA: Diagnosis not present

## 2022-11-05 DIAGNOSIS — H5203 Hypermetropia, bilateral: Secondary | ICD-10-CM | POA: Diagnosis not present

## 2022-11-05 DIAGNOSIS — D3131 Benign neoplasm of right choroid: Secondary | ICD-10-CM | POA: Diagnosis not present

## 2022-11-05 DIAGNOSIS — H43393 Other vitreous opacities, bilateral: Secondary | ICD-10-CM | POA: Diagnosis not present

## 2022-11-05 DIAGNOSIS — H04123 Dry eye syndrome of bilateral lacrimal glands: Secondary | ICD-10-CM | POA: Diagnosis not present

## 2022-11-05 DIAGNOSIS — H52223 Regular astigmatism, bilateral: Secondary | ICD-10-CM | POA: Diagnosis not present

## 2022-11-10 ENCOUNTER — Other Ambulatory Visit (HOSPITAL_COMMUNITY): Payer: Self-pay

## 2022-11-13 ENCOUNTER — Other Ambulatory Visit (HOSPITAL_COMMUNITY): Payer: Self-pay

## 2022-11-13 MED ORDER — TRAZODONE HCL 50 MG PO TABS
50.0000 mg | ORAL_TABLET | Freq: Every evening | ORAL | 5 refills | Status: DC | PRN
  Filled 2022-11-13: qty 30, 30d supply, fill #0
  Filled 2023-01-06: qty 30, 30d supply, fill #1
  Filled 2023-04-22: qty 30, 30d supply, fill #2

## 2022-11-19 ENCOUNTER — Other Ambulatory Visit (HOSPITAL_COMMUNITY): Payer: Self-pay

## 2023-01-06 ENCOUNTER — Other Ambulatory Visit (HOSPITAL_COMMUNITY): Payer: Self-pay

## 2023-01-15 ENCOUNTER — Other Ambulatory Visit: Payer: Self-pay | Admitting: Psychiatry

## 2023-01-15 ENCOUNTER — Other Ambulatory Visit (HOSPITAL_COMMUNITY): Payer: Self-pay

## 2023-01-15 DIAGNOSIS — F5104 Psychophysiologic insomnia: Secondary | ICD-10-CM

## 2023-01-18 ENCOUNTER — Other Ambulatory Visit (HOSPITAL_COMMUNITY): Payer: Self-pay

## 2023-01-18 ENCOUNTER — Encounter: Payer: Self-pay | Admitting: Neurology

## 2023-01-18 ENCOUNTER — Ambulatory Visit: Payer: Commercial Managed Care - PPO | Admitting: Neurology

## 2023-01-18 DIAGNOSIS — Z0289 Encounter for other administrative examinations: Secondary | ICD-10-CM

## 2023-01-18 MED ORDER — ZOLPIDEM TARTRATE ER 12.5 MG PO TBCR
12.5000 mg | EXTENDED_RELEASE_TABLET | Freq: Every evening | ORAL | 0 refills | Status: DC | PRN
  Filled 2023-01-18: qty 90, 90d supply, fill #0

## 2023-01-18 NOTE — Telephone Encounter (Signed)
Last visit on 01/19/22 Follow up scheduled on 01/18/23 Last filled on 11/19/22 # 60 tablet Rx pending to be signed

## 2023-01-19 ENCOUNTER — Other Ambulatory Visit (HOSPITAL_COMMUNITY): Payer: Self-pay

## 2023-01-26 ENCOUNTER — Telehealth: Payer: Commercial Managed Care - PPO | Admitting: Physician Assistant

## 2023-01-26 ENCOUNTER — Other Ambulatory Visit (HOSPITAL_COMMUNITY): Payer: Self-pay

## 2023-01-26 DIAGNOSIS — B9789 Other viral agents as the cause of diseases classified elsewhere: Secondary | ICD-10-CM

## 2023-01-26 DIAGNOSIS — J019 Acute sinusitis, unspecified: Secondary | ICD-10-CM | POA: Diagnosis not present

## 2023-01-26 MED ORDER — FLUTICASONE PROPIONATE 50 MCG/ACT NA SUSP
2.0000 | Freq: Every day | NASAL | 0 refills | Status: DC
  Filled 2023-01-26: qty 16, 30d supply, fill #0

## 2023-01-26 NOTE — Progress Notes (Signed)
E-Visit for Sinus Problems  We are sorry that you are not feeling well.  Here is how we plan to help!  Based on what you have shared with me it looks like you have sinusitis.  Sinusitis is inflammation and infection in the sinus cavities of the head.  Based on your presentation I believe you most likely have Acute Viral Sinusitis.This is an infection most likely caused by a virus. There is not specific treatment for viral sinusitis other than to help you with the symptoms until the infection runs its course.  You may use an oral decongestant such as Mucinex D or if you have glaucoma or high blood pressure use plain Mucinex. Saline nasal spray help and can safely be used as often as needed for congestion, I have prescribed: Fluticasone nasal spray two sprays in each nostril once a day  Some authorities believe that zinc sprays or the use of Echinacea may shorten the course of your symptoms.  Sinus infections are not as easily transmitted as other respiratory infection, however we still recommend that you avoid close contact with loved ones, especially the very young and elderly.  Remember to wash your hands thoroughly throughout the day as this is the number one way to prevent the spread of infection!  Providers prescribe antibiotics to treat infections caused by bacteria. Antibiotics are very powerful in treating bacterial infections when they are used properly. To maintain their effectiveness, they should be used only when necessary. Overuse of antibiotics has resulted in the development of superbugs that are resistant to treatment!    After careful review of your answers, I would not recommend an antibiotic for your condition giving just 4 days of symptoms.  Antibiotics are not effective against viruses and therefore should not be used to treat them. Common examples of infections caused by viruses include colds and flu  Home Care: Only take medications as instructed by your medical team. Do not  take these medications with alcohol. A steam or ultrasonic humidifier can help congestion.  You can place a towel over your head and breathe in the steam from hot water coming from a faucet. Avoid close contacts especially the very young and the elderly. Cover your mouth when you cough or sneeze. Always remember to wash your hands.  Get Help Right Away If: You develop worsening fever or sinus pain. You develop a severe head ache or visual changes. Your symptoms persist after you have completed your treatment plan.  Make sure you Understand these instructions. Will watch your condition. Will get help right away if you are not doing well or get worse.   Thank you for choosing an e-visit.  Your e-visit answers were reviewed by a board certified advanced clinical practitioner to complete your personal care plan. Depending upon the condition, your plan could have included both over the counter or prescription medications.  Please review your pharmacy choice. Make sure the pharmacy is open so you can pick up prescription now. If there is a problem, you may contact your provider through [employer] and have the prescription routed to another pharmacy.  Your safety is important to [place]. If you have drug allergies check your prescription carefully.   For the next 24 hours you can use MyChart to ask questions about today's visit, request a non-urgent call back, or ask for a work or school excuse. You will get an email in the next two days asking about your experience. I hope that your e-visit has been valuable  and will speed your recovery.

## 2023-01-26 NOTE — Progress Notes (Signed)
I have spent 5 minutes in review of e-visit questionnaire, review and updating patient chart, medical decision making and response to patient.   Dionicio Shelnutt Cody Jenilee Franey, PA-C    

## 2023-01-30 ENCOUNTER — Other Ambulatory Visit (HOSPITAL_COMMUNITY): Payer: Self-pay

## 2023-01-30 ENCOUNTER — Telehealth: Payer: Commercial Managed Care - PPO | Admitting: Family Medicine

## 2023-01-30 DIAGNOSIS — R3989 Other symptoms and signs involving the genitourinary system: Secondary | ICD-10-CM

## 2023-01-30 MED ORDER — CEPHALEXIN 500 MG PO CAPS
500.0000 mg | ORAL_CAPSULE | Freq: Two times a day (BID) | ORAL | 0 refills | Status: AC
  Filled 2023-01-30: qty 14, 7d supply, fill #0

## 2023-01-30 NOTE — Progress Notes (Signed)

## 2023-02-08 ENCOUNTER — Ambulatory Visit: Payer: Commercial Managed Care - PPO | Admitting: Neurology

## 2023-02-08 ENCOUNTER — Encounter: Payer: Self-pay | Admitting: Neurology

## 2023-02-17 ENCOUNTER — Other Ambulatory Visit (HOSPITAL_COMMUNITY): Payer: Self-pay

## 2023-03-09 ENCOUNTER — Ambulatory Visit: Payer: Commercial Managed Care - PPO | Admitting: Neurology

## 2023-03-09 ENCOUNTER — Telehealth: Payer: Self-pay | Admitting: Neurology

## 2023-03-09 NOTE — Telephone Encounter (Signed)
LVM and sent mychart msg informing pt of r/s needed for today's appt- MD out. 

## 2023-04-12 ENCOUNTER — Other Ambulatory Visit (HOSPITAL_COMMUNITY): Payer: Self-pay

## 2023-04-12 DIAGNOSIS — Z79899 Other long term (current) drug therapy: Secondary | ICD-10-CM | POA: Diagnosis not present

## 2023-04-12 DIAGNOSIS — F902 Attention-deficit hyperactivity disorder, combined type: Secondary | ICD-10-CM | POA: Diagnosis not present

## 2023-04-12 MED ORDER — LISDEXAMFETAMINE DIMESYLATE 20 MG PO CAPS
ORAL_CAPSULE | ORAL | 0 refills | Status: DC
  Filled 2023-05-28: qty 30, 30d supply, fill #0
  Filled 2023-05-28: qty 20, 20d supply, fill #0
  Filled 2023-05-28: qty 10, 10d supply, fill #0

## 2023-04-12 MED ORDER — LISDEXAMFETAMINE DIMESYLATE 40 MG PO CAPS
40.0000 mg | ORAL_CAPSULE | Freq: Every day | ORAL | 0 refills | Status: DC
  Filled 2023-04-12: qty 30, 30d supply, fill #0

## 2023-04-12 MED ORDER — LISDEXAMFETAMINE DIMESYLATE 40 MG PO CAPS
40.0000 mg | ORAL_CAPSULE | Freq: Every day | ORAL | 0 refills | Status: DC
  Filled 2023-07-30: qty 30, 30d supply, fill #0

## 2023-04-12 MED ORDER — LISDEXAMFETAMINE DIMESYLATE 20 MG PO CAPS
20.0000 mg | ORAL_CAPSULE | Freq: Every day | ORAL | 0 refills | Status: DC
  Filled 2023-07-30: qty 30, 30d supply, fill #0

## 2023-04-12 MED ORDER — LISDEXAMFETAMINE DIMESYLATE 20 MG PO CAPS
20.0000 mg | ORAL_CAPSULE | Freq: Every day | ORAL | 0 refills | Status: DC
  Filled 2023-04-12: qty 30, 30d supply, fill #0

## 2023-04-12 MED ORDER — LISDEXAMFETAMINE DIMESYLATE 40 MG PO CAPS
40.0000 mg | ORAL_CAPSULE | Freq: Every day | ORAL | 0 refills | Status: DC
  Filled 2023-05-28: qty 30, 30d supply, fill #0
  Filled 2023-05-28: qty 10, 10d supply, fill #0
  Filled 2023-05-28: qty 20, 20d supply, fill #0
  Filled 2023-05-28: qty 10, 10d supply, fill #0
  Filled 2023-05-28: qty 20, 20d supply, fill #0

## 2023-04-13 ENCOUNTER — Other Ambulatory Visit (HOSPITAL_COMMUNITY): Payer: Self-pay

## 2023-04-22 ENCOUNTER — Other Ambulatory Visit: Payer: Self-pay | Admitting: Neurology

## 2023-04-22 ENCOUNTER — Other Ambulatory Visit (HOSPITAL_COMMUNITY): Payer: Self-pay

## 2023-04-22 DIAGNOSIS — F5104 Psychophysiologic insomnia: Secondary | ICD-10-CM

## 2023-04-23 ENCOUNTER — Other Ambulatory Visit (HOSPITAL_COMMUNITY): Payer: Self-pay

## 2023-04-23 ENCOUNTER — Other Ambulatory Visit: Payer: Self-pay

## 2023-04-27 ENCOUNTER — Other Ambulatory Visit (HOSPITAL_COMMUNITY): Payer: Self-pay

## 2023-04-28 ENCOUNTER — Other Ambulatory Visit (HOSPITAL_COMMUNITY): Payer: Self-pay

## 2023-04-28 NOTE — Telephone Encounter (Signed)
Pt says she has been without this medication for a week to 10 days, the pharmacy suggested she calls for the status of the refilling of her AMBIEN , please call

## 2023-04-29 ENCOUNTER — Other Ambulatory Visit: Payer: Self-pay | Admitting: Neurology

## 2023-04-29 ENCOUNTER — Other Ambulatory Visit: Payer: Self-pay

## 2023-04-29 ENCOUNTER — Other Ambulatory Visit (HOSPITAL_COMMUNITY): Payer: Self-pay

## 2023-04-29 DIAGNOSIS — F5104 Psychophysiologic insomnia: Secondary | ICD-10-CM

## 2023-04-29 MED ORDER — ZOLPIDEM TARTRATE ER 12.5 MG PO TBCR
12.5000 mg | EXTENDED_RELEASE_TABLET | Freq: Every evening | ORAL | 0 refills | Status: DC | PRN
Start: 2023-04-29 — End: 2023-04-29
  Filled 2023-04-29: qty 30, 30d supply, fill #0

## 2023-04-29 MED ORDER — ZOLPIDEM TARTRATE ER 12.5 MG PO TBCR: 12.5000 mg | EXTENDED_RELEASE_TABLET | Freq: Every evening | ORAL | 0 refills | Status: DC | PRN

## 2023-04-29 MED ORDER — ZOLPIDEM TARTRATE ER 12.5 MG PO TBCR
12.5000 mg | EXTENDED_RELEASE_TABLET | Freq: Every evening | ORAL | 1 refills | Status: DC | PRN
Start: 2023-04-29 — End: 2023-06-29
  Filled 2023-04-29: qty 30, 30d supply, fill #0
  Filled 2023-05-28: qty 30, 30d supply, fill #1

## 2023-04-29 NOTE — Telephone Encounter (Signed)
Pt said did not have refill. Pt has scheduled f/u appt on 07/19/23 at 8:30 am

## 2023-04-29 NOTE — Telephone Encounter (Signed)
Pt scheduled appt on 07/19/23 at 8:30 am for f/u and medication

## 2023-04-29 NOTE — Addendum Note (Signed)
Addended by: Melvyn Novas on: 04/29/2023 05:21 PM   Modules accepted: Orders

## 2023-04-30 ENCOUNTER — Other Ambulatory Visit: Payer: Self-pay

## 2023-05-26 ENCOUNTER — Other Ambulatory Visit (HOSPITAL_COMMUNITY): Payer: Self-pay

## 2023-05-28 ENCOUNTER — Other Ambulatory Visit: Payer: Self-pay

## 2023-05-28 ENCOUNTER — Other Ambulatory Visit (HOSPITAL_COMMUNITY): Payer: Self-pay

## 2023-06-02 ENCOUNTER — Other Ambulatory Visit: Payer: Self-pay | Admitting: Oncology

## 2023-06-02 DIAGNOSIS — Z006 Encounter for examination for normal comparison and control in clinical research program: Secondary | ICD-10-CM

## 2023-06-25 ENCOUNTER — Other Ambulatory Visit (HOSPITAL_COMMUNITY)
Admission: RE | Admit: 2023-06-25 | Discharge: 2023-06-25 | Disposition: A | Discharge status: Home / Self Care | Payer: Commercial Managed Care - PPO | Source: Ambulatory Visit | Attending: Oncology | Admitting: Oncology

## 2023-06-25 DIAGNOSIS — Z006 Encounter for examination for normal comparison and control in clinical research program: Secondary | ICD-10-CM | POA: Insufficient documentation

## 2023-06-29 ENCOUNTER — Other Ambulatory Visit (HOSPITAL_COMMUNITY): Payer: Self-pay

## 2023-06-29 ENCOUNTER — Other Ambulatory Visit: Payer: Self-pay | Admitting: Neurology

## 2023-06-29 DIAGNOSIS — F5104 Psychophysiologic insomnia: Secondary | ICD-10-CM

## 2023-07-01 ENCOUNTER — Other Ambulatory Visit (HOSPITAL_COMMUNITY): Payer: Self-pay

## 2023-07-01 MED ORDER — ZOLPIDEM TARTRATE ER 12.5 MG PO TBCR
12.5000 mg | EXTENDED_RELEASE_TABLET | Freq: Every evening | ORAL | 1 refills | Status: DC | PRN
Start: 2023-07-01 — End: 2023-09-07
  Filled 2023-07-01: qty 30, 30d supply, fill #0
  Filled 2023-07-30: qty 30, 30d supply, fill #1

## 2023-07-10 LAB — HELIX MOLECULAR SCREEN

## 2023-07-10 LAB — GENECONNECT MOLECULAR SCREEN: Genetic Analysis Overall Interpretation: NEGATIVE

## 2023-07-19 ENCOUNTER — Encounter: Payer: Self-pay | Admitting: Neurology

## 2023-07-19 ENCOUNTER — Other Ambulatory Visit (HOSPITAL_COMMUNITY): Payer: Self-pay

## 2023-07-19 ENCOUNTER — Ambulatory Visit: Payer: Commercial Managed Care - PPO | Admitting: Neurology

## 2023-07-19 VITALS — BP 108/70 | HR 75 | Ht 63.0 in | Wt 188.2 lb

## 2023-07-19 DIAGNOSIS — F5105 Insomnia due to other mental disorder: Secondary | ICD-10-CM

## 2023-07-19 DIAGNOSIS — R0683 Snoring: Secondary | ICD-10-CM

## 2023-07-19 MED ORDER — DARIDOREXANT HCL 50 MG PO TABS
50.0000 mg | ORAL_TABLET | Freq: Every evening | ORAL | 1 refills | Status: DC | PRN
  Filled 2023-07-19 – 2023-08-26 (×2): qty 12, 12d supply, fill #0

## 2023-07-19 NOTE — Progress Notes (Signed)
Provider:  Melvyn Novas, MD  Primary Care Physician:  Madelin Headings, MD [address] [place] [address]     Referring Provider: Madelin Headings, Md [address] [place],  [place] [address]          Chief Complaint according to patient   Patient presents with:    LONGSTANDING SLEEP PATIENT > 10 years.             HISTORY OF PRESENT ILLNESS:  Tiffany Carson is a 57 y.o. female patient who is here for revisit 07/19/2023 for peri-menopausal induced insomnia. Has never been seen by cognitive behavioral therapist.  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 I like to know what is new on the market".  This patient is taking Vyvanse for ADD, ADHD.     Tiffany Carson is a 57 y.o. female , seen here as a revisit  from Dr. Fabian Sharp for  Insomnia, 07-01-2020.  History of Present Illness: Tiffany Carson is a 57 year old patient with chronic insomnia, no longer with focal dystonia. Ambien dependent.  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  She does not have to provide night services or emergency call.  She intends to go to PA school.  Her current Epworth sleepiness score is endorsed at 0 points and fatigue severity at 12 points.  Her insomnia is still present and chronically so but she did adjust sleep habits and sleep hygiene and has been doing well with sleep overall but using Ambien.she has taken Trazodone to alternate with Ambien on weekends, clonidin is used on weekdays ( Dr. Rosanna Randy, MD- ADHD specialist)     HPI:  Chronic Insomnia , but had been presenting with a task specific dystonia in 2015. Marland Kitchen Chief complaint according to patient : here today for Ambien refill. The patient reports that she is doing well on Ambien 12.5 in an extended release form, provided by  Dr. Fabian Sharp. Meanwhile , our NP has taken tihs over.    The patient had undergone a sleep study which revealed no organic sleep disorder of significance. She is meanwhile 57 years old, she underwent a hysterectomy at age 66 and very likely went into menopause within the next 2 years. This was a time of exacerbation of insomnia. She still needs about an hour to fall asleep after taking Ambien. Her bedtime is usually around 11:30 PM so she may fall asleep anytime between midnight and 1 AM. She does not have nocturia breaks, she usually sleeps through until 4 AM when her alarm rings. So she really doesn't gets a lot of sleep only about 4 hours with the help of medication each night. Her fatigue severity score was endorsed at 23 points the Epworth sleepiness score at 4 points both are below average not indicative of high fatigue or high sleepiness. She arrives at work at 6 AM. She takes rarely afternoon naps, if so- 30 minutes duration.  She was given Vyvanse for adult ADD, and has tolerated this well, no tics, no twitches. 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  She is aware that the continuous use of Ambien can lead to addiction. She had sleep eating spells on Ambien.  Social history: full tie gainfully employed, reviewed sleep habits, caffeine use 1 cup in AM.  No ETOH.  Non smoker.  Lives alone - wants to move her mother into her own home.  Pets; 2 dogs.        15th of August 2017, I have the pleasure of seeing Tiffany Carson today and her yearly revisit. The patient is taking Vyvanse for adult ADD as tolerated this well without any even incremental increase and dystonic muscle activity there is no myoclonusand no tic. She is using Ambien at night, has not reported new sleep walking episodes, no sleep eating episodes. The medicine is still working for her and she feels that she has a better quality of life. She endorsed the Epworth score  at 3 points and the fatigue severity score at 12 points. I will refill the medication today.       Review of Systems: Out of a complete 14 system review, the patient complains of only the following symptoms, and all other reviewed systems are negative.:  Fatigue, sleepiness , snoring, fragmented sleep, Insomnia- chronic  with onset in perimenopause.    How likely are you to doze in the following situations: 0 = not likely, 1 = slight chance, 2 = moderate chance, 3 = high chance   Sitting and Reading? Watching Television? Sitting inactive in a public place (theater or meeting)? As a passenger in a car for an hour without a break? Lying down in the afternoon when circumstances permit? Sitting and talking to someone? Sitting quietly after lunch without alcohol? In a car, while stopped for a few minutes in traffic?   Total = 0/ 24 points   FSS endorsed at 17/ 63 points.  GDS : depression / anxiety negative   Social History   Socioeconomic History   Marital status: Single    Spouse name: n/a   Number of children: 0   Years of education: 16   Highest education level: Not on file  Occupational History   Occupation: NUCLEAR MEDICINE    Employer: [employer]    Comment: [employer]  Tobacco Use   Smoking status: Never   Smokeless tobacco: Never  Vaping Use   Vaping status: Never Used  Substance and Sexual Activity   Alcohol use: Yes    Alcohol/week: 0.0 - 1.0 standard drinks of alcohol    Comment: rarely   Drug use: No   Sexual activity: Never    Birth control/protection: None  Other Topics Concern   Not on file  Social History Narrative   Lives alone, with 1 dog.   Cousins live nearby.  Siblings are in middle [place].   Neg td  ocass etoh 1 caffeine per day    40 hours  Per week.    Patient is right-handed.   Patient has a Probation officer.         Social Determinants of Health   Financial Resource Strain: Not on file  Food  Insecurity: Not on file  Transportation Needs: Not on file  Physical Activity: Not on file  Stress: Not on file  Social Connections: Not on file    Family History  Problem Relation Age of Onset   Diabetes Mother    Hypertension Mother    Diabetes Brother 70       2013, type 2   Arthritis Other    Hypertension Other    Colon cancer Neg Hx     Past Medical History:  Diagnosis Date   ABDOMINAL PAIN RIGHT LOWER QUADRANT 10/03/2010   Acquired absence of both cervix and uterus 10/03/2010   ADHD (attention deficit hyperactivity disorder)    per dr Elisabeth Most  see notes   ANEMIA, IRON DEFICIENCY 07/02/2010   COUGH, CHRONIC 04/09/2010   pt denies   FATIGUE 06/24/2009   FIBROIDS, UTERUS 09/03/2010   GERD 08/08/2007   HYPERLIPIDEMIA 08/08/2007   Irregular menstrual cycle 06/24/2009   LEG CRAMPS 06/24/2009   LEG PAIN, RIGHT 10/03/2010   OBESITY 06/24/2009   SLEEPLESSNESS 11/16/2007   TINGLING 10/03/2010   TREMOR 07/02/2010   VITAMIN D DEFICIENCY 06/02/2010    Past Surgical History:  Procedure Laterality Date   ABDOMINAL HYSTERECTOMY  nov 2011   secondary infection  had adhesions   BREAST REDUCTION SURGERY  09 20 12   BREAST SURGERY     CHOLECYSTECTOMY     CYST REMOVAL HAND     right hand 2019   OOPHORECTOMY     left   salpingesctomy    REDUCTION MAMMAPLASTY Bilateral 07/2011   TRIGGER FINGER RELEASE  08/2018   right middle finger and excision of ganglion cyst of right wrist     Current Outpatient Medications on File Prior to Visit  Medication Sig Dispense Refill   cloNIDine (CATAPRES) 0.1 MG tablet Take 1-2 tablets (0.1-0.2 mg total) by mouth at bedtime. 180 tablet 3   fluticasone (FLONASE) 50 MCG/ACT nasal spray Place 2 sprays into both nostrils daily. 16 g 0   ibuprofen (ADVIL,MOTRIN) 200 MG tablet Take 200 mg by mouth every 6 (six) hours as needed.       lisdexamfetamine (VYVANSE) 20 MG capsule Take 1 capsule by mouth daily; May be filled 30 days after date prescribed 30 capsule 0    lisdexamfetamine (VYVANSE) 20 MG capsule Take 1 capsule by mouth daily. 30 capsule 0   lisdexamfetamine (VYVANSE) 20 MG capsule Take 1 capsule by mouth daily; 30 capsule 0   lisdexamfetamine (VYVANSE) 20 MG capsule Take 1 capsule (20 mg total) by mouth daily. 30 capsule 0   lisdexamfetamine (VYVANSE) 20 MG capsule Take 1 capsule (20 mg total) by mouth daily. 30 capsule 0   lisdexamfetamine (VYVANSE) 20 MG capsule Take 1 capsule (20 mg total) by mouth daily. (60 days after date written) 30 capsule 0   lisdexamfetamine (VYVANSE) 20 MG capsule Take 1 capsule (20 mg total) by mouth daily. 30 capsule 0   lisdexamfetamine (VYVANSE) 20 MG capsule Take 1 capsule (20 mg) by mouth daily. 30 capsule 0   lisdexamfetamine (VYVANSE) 20 MG capsule Take 1 capsule by mouth daily; May be filled 60 days after date prescribed 30 capsule 0   lisdexamfetamine (VYVANSE) 20 MG capsule Take 1 capsule by mouth daily; May be filled 30 days after date prescribed 30 capsule 0   lisdexamfetamine (VYVANSE) 40 MG capsule Take 1 capsule by mouth daily. 30 capsule 0   lisdexamfetamine (VYVANSE) 40 MG capsule Take 1 capsule by mouth daily; May be filled 30 days after date prescribed 30 capsule 0   lisdexamfetamine (VYVANSE) 40 MG capsule Take 1 capsule by mouth daily; May be filled 60 days after date prescribed 30 capsule 0   lisdexamfetamine (VYVANSE) 40 MG capsule Take 1 capsule (40 mg total) by mouth daily. 30 capsule 0   lisdexamfetamine (VYVANSE) 40 MG capsule Take 1 capsule (40 mg total) by mouth daily. 30 capsule 0   lisdexamfetamine (VYVANSE) 40 MG capsule Take 1 capsule (40 mg total) by mouth daily. (  30 days after date filled) 30 capsule 0   lisdexamfetamine (VYVANSE) 40 MG capsule Take 1 capsule (40 mg total) by mouth daily. 30 capsule 0   lisdexamfetamine (VYVANSE) 40 MG capsule Take 1 capsule (40 mg total) by mouth daily. 30 capsule 0   lisdexamfetamine (VYVANSE) 40 MG capsule Take 1 capsule by mouth daily; May be  filled 60 days after date prescribed 30 capsule 0   lisdexamfetamine (VYVANSE) 40 MG capsule Take 1 capsule (40 mg total) by mouth daily. 30 capsule 0   Omega-3 Fatty Acids (FISH OIL) 1000 MG CPDR Take 1 tablet by mouth daily.     traZODone (DESYREL) 50 MG tablet Take 1 tablet (50 mg total) by mouth at bedtime as needed for sleep 30 tablet 5   Vitamin D, Cholecalciferol, 400 UNITS CHEW Chew by mouth.      zolpidem (AMBIEN CR) 12.5 MG CR tablet Take 1 tablet (12.5 mg total) by mouth at bedtime as needed for sleep. 30 tablet 0   zolpidem (AMBIEN CR) 12.5 MG CR tablet Take 1 tablet (12.5 mg total) by mouth at bedtime as needed for sleep. 30 tablet 1   lisdexamfetamine (VYVANSE) 20 MG capsule TAKE 1 CAPSULE BY MOUTH DAILY 30 capsule 0   lisdexamfetamine (VYVANSE) 40 MG capsule TAKE 1 CAPSULE BY MOUTH DAILY 30 capsule 0   No current facility-administered medications on file prior to visit.    Allergies  Allergen Reactions   Percocet [Oxycodone-Acetaminophen] Itching     DIAGNOSTIC DATA (LABS, IMAGING, TESTING) - I reviewed patient records, labs, notes, testing and imaging myself where available.  Lab Results  Component Value Date   WBC 10.3 11/22/2018   HGB 15.3 (H) 11/22/2018   HCT 45.4 11/22/2018   MCV 90.3 11/22/2018   PLT 426.0 (H) 11/22/2018      Component Value Date/Time   NA 139 11/22/2018 1616   K 4.6 11/22/2018 1616   CL 100 11/22/2018 1616   CO2 31 11/22/2018 1616   GLUCOSE 104 (H) 11/22/2018 1616   BUN 12 11/22/2018 1616   CREATININE 0.67 11/22/2018 1616   CALCIUM 10.2 11/22/2018 1616   PROT 7.2 12/27/2017 1422   ALBUMIN 4.6 12/27/2017 1422   AST 18 12/27/2017 1422   ALT 21 12/27/2017 1422   ALKPHOS 84 12/27/2017 1422   BILITOT 0.3 12/27/2017 1422   GFRNONAA 100.56 10/03/2010 0000   Lab Results  Component Value Date   CHOL 217 (H) 11/30/2018   HDL 58.50 11/30/2018   LDLCALC 135 (H) 11/30/2018   LDLDIRECT 135.4 06/27/2012   TRIG 114.0 11/30/2018   CHOLHDL 4  11/30/2018   Lab Results  Component Value Date   HGBA1C 6.1 11/22/2018   Lab Results  Component Value Date   VITAMINB12 521 05/04/2016   Lab Results  Component Value Date   TSH 0.97 11/22/2018    PHYSICAL EXAM:  Today's Vitals   07/19/23 0825  BP: 108/70  Pulse: 75  Weight: 188 lb 3.2 oz (85.4 kg)  Height: 5\' 3"  (1.6 m)   Body mass index is 33.34 kg/m.   Wt Readings from Last 3 Encounters:  07/19/23 188 lb 3.2 oz (85.4 kg)  01/19/22 187 lb 8 oz (85 kg)  07/14/21 188 lb 8 oz (85.5 kg)     Ht Readings from Last 3 Encounters:  07/19/23 5\' 3"  (1.6 m)  01/19/22 5\' 3"  (1.6 m)  07/14/21 5\' 3"  (1.6 m)      General: The patient is awake, alert and  appears not in acute distress. The patient is well groomed. Head: Normocephalic, atraumatic. Neck is supple. Mallampati 3,  neck circumference: 15. Nasal airflow seasonally restricted , TMJ is not  evident . Retrognathia is not seen.  Cardiovascular:  Regular rate and rhythm , without  murmurs or carotid bruit, and without distended neck veins. Respiratory: Lungs are clear to auscultation. Skin:  Without evidence of edema, or rash Trunk: BMI is elevated . The patient's posture is erect.   Neurologic exam : The patient is awake and alert, oriented to place and time.    Attention span & concentration ability appears normal.  Speech is fluent,  without  dysarthria, dysphonia or aphasia. Mood and affect are appropriate.   Cranial nerves: longstanding loss of smell and taste - not COVID related.  Fully vaccinated.  Pupils are equal and briskly reactive to light.  Extraocular movements  in vertical and horizontal planes intact .Hearing to finger rub intact.  Facial motor strength is symmetric and tongue and uvula move midline. Shoulder shrug was symmetrical.      ASSESSMENT AND PLAN 57 y.o. year old female  here with: Questions about chronic insomnia treatments- 1) CB therapy. 2) Both Quviviq and Ambien / Ambien CR have been  shown to be more effective than a placebo (an inactive treatment) for the treatment of insomnia. Quviviq and Ambien CR can be used to help you get to sleep and stay asleep, while Ambien is more short-acting and is used primarily to help you get to sleep    1) chronic insomnia- using trazodone, clonidine and only on weekends for 3 days, does she use Ambien. Failed Belsomra. Failed melatonin.  We start Daridorexant 50 mg in place of Ambien for a period of 12 nights ( 4 weeks) . If this works well, lets change for the long run.  I will refill her medications but like to refer to cognitive behavior therapy for insomnia.  2)patient feels strongly that Vyvance doesn't interfere with sleep. I like the second dose of Vyvance to be  moved to noon time- (I would like this  to become a once in AM dose).   3) reports no snoring !!   I plan to follow up either personally or through our NP within 12 months.   I would like to thank  Panosh, Neta Mends, Md [address] [place],  [place] [address] for allowing me to meet with and to take care of this pleasant patient.     After spending a total time of  25  minutes face to face and additional time for physical and neurologic examination, review of laboratory studies,  personal review of imaging studies, reports and results of other testing and review of referral information / records as far as provided in visit,   Electronically signed by: Melvyn Novas, MD 07/19/2023 8:36 AM  [facility] Board certified by The [employer] of Sleep Medicine and Diplomate of the [employer] of [employer] Medicine. Board certified In Neurology through the ABPN, Fellow of the [employer] of Neu[employer].

## 2023-07-19 NOTE — Patient Instructions (Signed)
Daridorexant Tablets What is this medication? DARIDOREXANT (DAR i doe REX ant) treats insomnia. It helps you go to sleep faster and stay asleep through the night. This medicine may be used for other purposes; ask your health care provider or pharmacist if you have questions. COMMON BRAND NAME(S): QUVIVIQ What should I tell my care team before I take this medication? They need to know if you have any of these conditions: Depression Frequently drink alcohol History of a sudden onset of muscle weakness (cataplexy) History of falling asleep often at unexpected times (narcolepsy) History of substance use disorder Liver disease Lung or breathing disease Sleep apnea Sleep-walking, driving, eating or other activity while not fully awake after taking a sleep medication Suicidal thoughts, plans, or attempt by you or a family member An unusual or allergic reaction to daridorexant, other medications, foods, dyes, or preservatives Pregnant or trying to get pregnant Breast-feeding How should I use this medication? Take this medication by mouth with water 30 minutes before going to bed. Take it as directed on the prescription label. It is better to take this medication on an empty stomach. A special MedGuide will be given to you by the pharmacist with each prescription and refill. Be sure to read this information carefully each time. Talk to your care team about the use of this medication in children. Special care may be needed. Overdosage: If you think you have taken too much of this medicine contact a poison control center or emergency room at once. NOTE: This medicine is only for you. Do not share this medicine with others. What if I miss a dose? This does not apply. This medication should only be taken as directed before going to sleep. Do not take double or extra doses. What may interact with this medication? Alcohol Benzodiazepines, such as alprazolam, diazepam, or lorazepam Bosentan Certain  antibiotics, such as erythromycin or clarithromycin Certain antihistamines Certain antivirals for HIV or hepatitis Certain medications for depression, anxiety, or mental health conditions Certain medications for fungal infections, such as itraconazole, ketoconazole, posaconazole, voriconazole Certain medications for seizures, such as carbamazepine, phenytoin, phenobarbital Diltiazem Grapefruit juice Medications that cause drowsiness before a procedure, such as propofol Medications that relax muscles Opioids for pain or cough Other medications that help you fall asleep Phenothiazines, such as chlorpromazine, prochlorperazine, thioridazine Rifampin St. John's wort Verapamil This list may not describe all possible interactions. Give your health care provider a list of all the medicines, herbs, non-prescription drugs, or dietary supplements you use. Also tell them if you smoke, drink alcohol, or use illegal drugs. Some items may interact with your medicine. What should I watch for while using this medication? Visit your care team for regular checks on your progress. Keep a regular sleep schedule by going to bed at about the same time each night. Avoid caffeine-containing drinks in the evening hours. Talk to your care team if your insomnia worsens or is not better within 7 to 10 days. You may do unusual sleep behaviors or activities you do not remember the day after taking this medication. Activities include driving, making or eating food, talking on the phone, sexual activity, or sleep walking. Stop taking this medication and call your care team right away if you find out you have done activities like this. Plan to go to bed and stay in bed for a full night (7 to 8 hours) after you take this medication. You may still be drowsy the morning after taking this medication. This medication may affect your coordination,  reaction time, or judgment. Do not drive or operate machinery until you know how this  medication affects you. Sit up or stand slowly to reduce the risk of dizzy or fainting spells. Watch for new or worsening thoughts of suicide or depression. This includes sudden changes in mood, behaviors, or thoughts. These changes can happen at any time but are more common in the beginning of treatment or after a change in dose. Call your care team right away if you experience these thoughts or worsening depression. After you stop taking this medication, you may have trouble falling asleep. This is called rebound insomnia. This problem usually goes away on its own after 1 or 2 nights. What side effects may I notice from receiving this medication? Side effects that you should report to your care team as soon as possible: Allergic reactions or angioedema--skin rash, itching or hives, swelling of the face, eyes, lips, tongue, arms, or legs, trouble swallowing or breathing CNS depression--slow or shallow breathing, shortness of breath, feeling faint, dizziness, confusion, trouble staying awake Mood and behavior changes--anxiety, nervousness, confusion, hallucinations, irritability, hostility, thoughts of suicide or self-harm, worsening mood, feelings of depression Sudden and temporary muscle weakness Unable to move or speak for several minutes upon waking or going to sleep Unusual sleep behaviors or activities you do not remember such as driving, eating, or sexual activity Side effects that usually do not require medical attention (report these to your care team if they continue or are bothersome): Drowsiness the day after use Fatigue Headache This list may not describe all possible side effects. Call your doctor for medical advice about side effects. You may report side effects to FDA at 1-800-FDA-1088. Where should I keep my medication? Keep out of the reach of children and pets. This medication can be abused. Keep it in a safe place to protect it from theft. Do not share it with anyone. It is only  for you. Selling or giving away this medication is dangerous and against the law. Store between 20 and 25 degrees C (68 and 77 degrees F). Get rid of any unused medication after the expiration date. This medication may cause harm and death if it is taken by other adults, children, or pets. It is important to get rid of the medication as soon as you no longer need it or it is expired. You can do this in two ways: Take the medication to a medication take-back program. Check with your pharmacy or law enforcement to find a location. If you cannot return the medication, check the label or package insert to see if the medication should be thrown out in the garbage or flushed down the toilet. If you are not sure, ask your care team. If it is safe to put it in the trash, take the medication out of the container. Mix the medication with cat litter, dirt, coffee grounds, or other unwanted substance. Seal the mixture in a bag or container. Put it in the trash. NOTE: This sheet is a summary. It may not cover all possible information. If you have questions about this medicine, talk to your doctor, pharmacist, or health care provider.  2024 Elsevier/Gold Standard (2022-09-03 00:00:00)

## 2023-07-21 ENCOUNTER — Other Ambulatory Visit (HOSPITAL_COMMUNITY): Payer: Self-pay

## 2023-07-30 ENCOUNTER — Other Ambulatory Visit (HOSPITAL_COMMUNITY): Payer: Self-pay

## 2023-08-11 ENCOUNTER — Other Ambulatory Visit (HOSPITAL_COMMUNITY): Payer: Self-pay

## 2023-08-11 ENCOUNTER — Other Ambulatory Visit: Payer: Self-pay | Admitting: Pharmacy Technician

## 2023-08-11 NOTE — Telephone Encounter (Signed)
Pharmacy Patient Advocate Encounter   Received notification from CoverMyMeds that prior authorization for Quviviq 50MG  tablets is required/requested.   Insurance verification completed.   The patient is insured through [facility] .   Per test claim: PA required; PA submitted to [facility] via CoverMyMeds Key/confirmation #/EOC [id number] Status is pending

## 2023-08-20 ENCOUNTER — Other Ambulatory Visit (HOSPITAL_COMMUNITY): Payer: Self-pay

## 2023-08-26 ENCOUNTER — Other Ambulatory Visit (HOSPITAL_COMMUNITY): Payer: Self-pay

## 2023-08-26 ENCOUNTER — Other Ambulatory Visit: Payer: Self-pay | Admitting: Anesthesiology

## 2023-08-27 ENCOUNTER — Other Ambulatory Visit: Payer: Self-pay | Admitting: Neurology

## 2023-08-27 ENCOUNTER — Other Ambulatory Visit (HOSPITAL_COMMUNITY): Payer: Self-pay

## 2023-08-27 DIAGNOSIS — F5104 Psychophysiologic insomnia: Secondary | ICD-10-CM

## 2023-09-03 ENCOUNTER — Other Ambulatory Visit (HOSPITAL_COMMUNITY): Payer: Self-pay

## 2023-09-03 ENCOUNTER — Other Ambulatory Visit: Payer: Self-pay

## 2023-09-06 ENCOUNTER — Other Ambulatory Visit: Payer: Self-pay | Admitting: Neurology

## 2023-09-06 DIAGNOSIS — F5105 Insomnia due to other mental disorder: Secondary | ICD-10-CM

## 2023-09-06 DIAGNOSIS — F5104 Psychophysiologic insomnia: Secondary | ICD-10-CM

## 2023-09-06 DIAGNOSIS — M67442 Ganglion, left hand: Secondary | ICD-10-CM | POA: Diagnosis not present

## 2023-09-06 NOTE — Telephone Encounter (Signed)
Pt said pharmacy cannot fill prescription as written due to cannot open another for lest than 30 tablets. The way the prescription is written is unfillable. Pt stated out of medication. Would like a call back.

## 2023-09-06 NOTE — Telephone Encounter (Signed)
PA submitted for 30 tab per 75 day supply as the pt only takes Friday, sat and Sunday. KEY: [id number]

## 2023-09-06 NOTE — Telephone Encounter (Signed)
Pt has been told that re: her Daridorexant HCl 50 MG TABS this will not be filled by a regular pharmacy like [facility] or even by  [facility]  Since it is a Rx written for less than 30.  Pt is asking if the Rx can be redone for 30 or more in order to be able to get a refill.

## 2023-09-06 NOTE — Telephone Encounter (Signed)
I have routed this to the MD to fill for 30 tablet making this supply a 75 day supply because the patient only takes it on fri, sat and sun at bedtime as needed.

## 2023-09-07 ENCOUNTER — Other Ambulatory Visit (HOSPITAL_COMMUNITY): Payer: Self-pay

## 2023-09-07 MED ORDER — DARIDOREXANT HCL 50 MG PO TABS
50.0000 mg | ORAL_TABLET | Freq: Every evening | ORAL | 0 refills | Status: DC | PRN
  Filled 2023-09-07: qty 30, 70d supply, fill #0

## 2023-09-08 ENCOUNTER — Other Ambulatory Visit (HOSPITAL_COMMUNITY): Payer: Self-pay

## 2023-09-10 ENCOUNTER — Other Ambulatory Visit (HOSPITAL_COMMUNITY): Payer: Self-pay

## 2023-09-16 ENCOUNTER — Other Ambulatory Visit (HOSPITAL_COMMUNITY): Payer: Self-pay

## 2023-09-16 ENCOUNTER — Other Ambulatory Visit: Payer: Self-pay | Admitting: Neurology

## 2023-09-16 MED ORDER — ZOLPIDEM TARTRATE ER 12.5 MG PO TBCR
12.5000 mg | EXTENDED_RELEASE_TABLET | Freq: Every evening | ORAL | 5 refills | Status: DC | PRN
  Filled 2023-09-16: qty 30, 30d supply, fill #0
  Filled 2023-10-15: qty 30, 30d supply, fill #1
  Filled 2023-11-12: qty 30, 30d supply, fill #2
  Filled 2023-12-09: qty 30, 30d supply, fill #3
  Filled 2024-01-03 – 2024-01-07 (×2): qty 30, 30d supply, fill #4
  Filled 2024-02-04: qty 30, 30d supply, fill #5

## 2023-09-16 NOTE — Telephone Encounter (Signed)
Refill placed for the ambien CR and sent to the pharmacy for the pt

## 2023-09-16 NOTE — Telephone Encounter (Signed)
Pt called stating that her Insurance denied the Daridorexant HCl 50 MG TABS and she does not want to pursue this medication any longer and would like to go back on the Ambien 12.5mg . Pt would like to do the 90 day supply. Please release Ambien Rx to the [facility]. Pt is completely out at this time. Please advise.

## 2023-09-16 NOTE — Addendum Note (Signed)
Addended by: Judi Cong on: 09/16/2023 04:42 PM   Modules accepted: Orders

## 2023-09-16 NOTE — Addendum Note (Signed)
Addended by: Melvyn Novas on: 09/16/2023 04:50 PM   Modules accepted: Orders

## 2023-09-17 ENCOUNTER — Other Ambulatory Visit (HOSPITAL_COMMUNITY): Payer: Self-pay

## 2023-09-18 ENCOUNTER — Other Ambulatory Visit (HOSPITAL_COMMUNITY): Payer: Self-pay

## 2023-09-18 MED ORDER — LISDEXAMFETAMINE DIMESYLATE 20 MG PO CAPS
20.0000 mg | ORAL_CAPSULE | Freq: Every day | ORAL | 0 refills | Status: DC
  Filled 2023-09-18: qty 30, 30d supply, fill #0

## 2023-09-18 MED ORDER — LISDEXAMFETAMINE DIMESYLATE 40 MG PO CAPS
40.0000 mg | ORAL_CAPSULE | Freq: Every day | ORAL | 0 refills | Status: DC
  Filled 2023-09-18: qty 30, 30d supply, fill #0

## 2023-09-22 ENCOUNTER — Encounter (HOSPITAL_COMMUNITY): Payer: Self-pay | Admitting: Emergency Medicine

## 2023-09-22 ENCOUNTER — Other Ambulatory Visit (HOSPITAL_COMMUNITY): Payer: Self-pay

## 2023-09-22 ENCOUNTER — Ambulatory Visit (HOSPITAL_COMMUNITY)
Admission: EM | Admit: 2023-09-22 | Discharge: 2023-09-22 | Disposition: A | Discharge status: Home / Self Care | Payer: Commercial Managed Care - PPO | Attending: Family Medicine | Admitting: Family Medicine

## 2023-09-22 DIAGNOSIS — H6503 Acute serous otitis media, bilateral: Secondary | ICD-10-CM | POA: Diagnosis not present

## 2023-09-22 DIAGNOSIS — J069 Acute upper respiratory infection, unspecified: Secondary | ICD-10-CM | POA: Diagnosis not present

## 2023-09-22 DIAGNOSIS — R051 Acute cough: Secondary | ICD-10-CM

## 2023-09-22 DIAGNOSIS — J0100 Acute maxillary sinusitis, unspecified: Secondary | ICD-10-CM | POA: Diagnosis not present

## 2023-09-22 MED ORDER — PREDNISONE 20 MG PO TABS
40.0000 mg | ORAL_TABLET | Freq: Every day | ORAL | 0 refills | Status: DC
  Filled 2023-09-22: qty 10, 5d supply, fill #0

## 2023-09-22 MED ORDER — HYDROCODONE BIT-HOMATROP MBR 5-1.5 MG/5ML PO SOLN
5.0000 mL | Freq: Four times a day (QID) | ORAL | 0 refills | Status: AC | PRN
  Filled 2023-09-22: qty 90, 5d supply, fill #0

## 2023-09-22 MED ORDER — CEFDINIR 300 MG PO CAPS
300.0000 mg | ORAL_CAPSULE | Freq: Two times a day (BID) | ORAL | 0 refills | Status: AC
  Filled 2023-09-22: qty 14, 7d supply, fill #0

## 2023-09-22 NOTE — ED Provider Notes (Signed)
[facility]   [id number] 09/22/23 Arrival Time: 1042  ASSESSMENT & PLAN:  1. Upper respiratory tract infection, unspecified type   2. Acute non-recurrent maxillary sinusitis   3. Non-recurrent acute serous otitis media of both ears   4. Acute cough     Meds ordered this encounter  Medications   cefdinir (OMNICEF) 300 MG capsule    Sig: Take 1 capsule (300 mg total) by mouth 2 (two) times daily.    Dispense:  14 capsule    Refill:  0   HYDROcodone bit-homatropine (HYCODAN) 5-1.5 MG/5ML syrup    Sig: Take 5 mLs by mouth every 6 (six) hours as needed for cough.    Dispense:  90 mL    Refill:  0   predniSONE (DELTASONE) 20 MG tablet    Sig: Take 2 tablets (40 mg total) by mouth daily.    Dispense:  10 tablet    Refill:  0     Discharge Instructions      Be aware, your cough medication may cause drowsiness. Please do not drive, operate heavy machinery or make important decisions while on this medication, it can cloud your judgement.       Discussed typical duration of symptoms. OTC symptom care as needed. Ensure adequate fluid intake and rest.   Follow-up Information     Panosh, Neta Mends, MD.   Specialties: Internal Medicine, Pediatrics Why: As needed. Contact information: [address] [place] [address] [phone]         [facility] at [facility].   Specialty: Urgent Care Why: If worsening or failing to improve as anticipated. Contact information: [address] [place] [address] [phone]                Reviewed expectations re: course of current medical issues. Questions answered. Outlined signs and symptoms indicating need for more acute intervention. Patient verbalized understanding. After Visit Summary given.   SUBJECTIVE: History from: patient.  Tiffany Carson is a 57 y.o. female who presents with complaint of nasal congestion, post-nasal drainage, and sinus pain and  bilateral ear pressure. Onset gradual, a week ago. Respiratory symptoms: mild cough but mostly head symptoms bothering her. Fever: denies. Overall normal PO intake without n/v.  History of frequent sinus infections: no. No specific aggravating or alleviating factors reported. Cough is affecting sleep.  Social History   Tobacco Use  Smoking Status Never  Smokeless Tobacco Never    OBJECTIVE:  Vitals:   09/22/23 1141  BP: 134/85  Pulse: 92  Resp: 17  Temp: 98.1 F (36.7 C)  TempSrc: Oral  SpO2: 96%     General appearance: alert; no distress HEENT: nasal congestion; clear runny nose; throat irritation secondary to post-nasal drainage; mild bilateral maxillary tenderness to palpation; turbinates boggy; both TM with serous otitis Neck: supple without LAD; trachea midline Lungs: unlabored respirations, symmetrical air entry; cough: mild; no respiratory distress Skin: warm and dry Psychological: alert and cooperative; normal mood and affect  Allergies  Allergen Reactions   Percocet [Oxycodone-Acetaminophen] Itching   Codeine Itching    Past Medical History:  Diagnosis Date   ABDOMINAL PAIN RIGHT LOWER QUADRANT 10/03/2010   Acquired absence of both cervix and uterus 10/03/2010   ADHD (attention deficit hyperactivity disorder)    per dr Elisabeth Most  see notes   ANEMIA, IRON DEFICIENCY 07/02/2010   COUGH, CHRONIC 04/09/2010   pt denies   FATIGUE 06/24/2009   FIBROIDS, UTERUS 09/03/2010  GERD 08/08/2007   HYPERLIPIDEMIA 08/08/2007   Irregular menstrual cycle 06/24/2009   LEG CRAMPS 06/24/2009   LEG PAIN, RIGHT 10/03/2010   OBESITY 06/24/2009   SLEEPLESSNESS 11/16/2007   TINGLING 10/03/2010   TREMOR 07/02/2010   VITAMIN D DEFICIENCY 06/02/2010   Family History  Problem Relation Age of Onset   Diabetes Mother    Hypertension Mother    Diabetes Brother 20       2013, type 2   Arthritis Other    Hypertension Other    Colon cancer Neg Hx    Social History   Socioeconomic History    Marital status: Single    Spouse name: n/a   Number of children: 0   Years of education: 16   Highest education level: Not on file  Occupational History   Occupation: NUCLEAR MEDICINE    Employer: [employer]    Comment: [employer]  Tobacco Use   Smoking status: Never   Smokeless tobacco: Never  Vaping Use   Vaping status: Never Used  Substance and Sexual Activity   Alcohol use: Yes    Alcohol/week: 0.0 - 1.0 standard drinks of alcohol    Comment: rarely   Drug use: No   Sexual activity: Never    Birth control/protection: None  Other Topics Concern   Not on file  Social History Narrative   Lives alone, with 1 dog.   Cousins live nearby.  Siblings are in middle [place].   Neg td  ocass etoh 1 caffeine per day    40 hours  Per week.    Patient is right-handed.   Patient has a Probation officer.         Social Determinants of Health   Financial Resource Strain: Not on file  Food Insecurity: Not on file  Transportation Needs: Not on file  Physical Activity: Not on file  Stress: Not on file  Social Connections: Not on file  Intimate Partner Violence: Not on file             Mardella Layman, MD 09/22/23 1406

## 2023-09-22 NOTE — ED Triage Notes (Signed)
Pt c/o cough, congestion, and bilateral ear fullness that has been going on for over 1 week.

## 2023-09-22 NOTE — Discharge Instructions (Signed)
Be aware, your cough medication may cause drowsiness. Please do not drive, operate heavy machinery or make important decisions while on this medication, it can cloud your judgement.  

## 2023-10-04 ENCOUNTER — Other Ambulatory Visit (HOSPITAL_COMMUNITY): Payer: Self-pay

## 2023-10-04 ENCOUNTER — Other Ambulatory Visit: Payer: Self-pay | Admitting: Orthopedic Surgery

## 2023-10-04 DIAGNOSIS — M67442 Ganglion, left hand: Secondary | ICD-10-CM | POA: Diagnosis not present

## 2023-10-04 DIAGNOSIS — M71342 Other bursal cyst, left hand: Secondary | ICD-10-CM | POA: Diagnosis not present

## 2023-10-04 MED ORDER — HYDROCODONE-ACETAMINOPHEN 5-325 MG PO TABS
1.0000 | ORAL_TABLET | Freq: Four times a day (QID) | ORAL | 0 refills | Status: AC
  Filled 2023-10-04: qty 5, 2d supply, fill #0

## 2023-10-05 ENCOUNTER — Other Ambulatory Visit (HOSPITAL_COMMUNITY): Payer: Self-pay | Admitting: Family Medicine

## 2023-10-05 LAB — SURGICAL PATHOLOGY

## 2023-10-13 ENCOUNTER — Other Ambulatory Visit (HOSPITAL_COMMUNITY): Payer: Self-pay

## 2023-10-15 ENCOUNTER — Other Ambulatory Visit (HOSPITAL_COMMUNITY): Payer: Self-pay

## 2023-11-04 ENCOUNTER — Encounter (INDEPENDENT_AMBULATORY_CARE_PROVIDER_SITE_OTHER): Payer: Self-pay

## 2023-11-04 ENCOUNTER — Telehealth (INDEPENDENT_AMBULATORY_CARE_PROVIDER_SITE_OTHER): Payer: Self-pay | Admitting: Otolaryngology

## 2023-11-04 DIAGNOSIS — H6502 Acute serous otitis media, left ear: Secondary | ICD-10-CM | POA: Diagnosis not present

## 2023-11-04 DIAGNOSIS — L299 Pruritus, unspecified: Secondary | ICD-10-CM | POA: Insufficient documentation

## 2023-11-04 DIAGNOSIS — H6992 Unspecified Eustachian tube disorder, left ear: Secondary | ICD-10-CM | POA: Diagnosis not present

## 2023-11-04 DIAGNOSIS — H9202 Otalgia, left ear: Secondary | ICD-10-CM | POA: Insufficient documentation

## 2023-11-04 NOTE — Telephone Encounter (Signed)
 Patient called and left vmail msg on 11/02/23 @ 4:28pm to schedule a new patient appointment with one of our ENT Providers for ear feeling like it is blocked.  I tried calling patient, but was unable to leave a voicemail message because their mailbox is full and can not accept messages.  I sent the patient a MyChart msg stating we would need a referral.

## 2023-11-12 ENCOUNTER — Other Ambulatory Visit (HOSPITAL_COMMUNITY): Payer: Self-pay

## 2023-11-29 ENCOUNTER — Other Ambulatory Visit (HOSPITAL_COMMUNITY): Payer: Self-pay

## 2023-11-29 DIAGNOSIS — F902 Attention-deficit hyperactivity disorder, combined type: Secondary | ICD-10-CM | POA: Diagnosis not present

## 2023-11-29 DIAGNOSIS — Z79899 Other long term (current) drug therapy: Secondary | ICD-10-CM | POA: Diagnosis not present

## 2023-11-29 MED ORDER — LISDEXAMFETAMINE DIMESYLATE 40 MG PO CAPS
40.0000 mg | ORAL_CAPSULE | Freq: Every day | ORAL | 0 refills | Status: AC
  Filled 2024-01-19: qty 30, 30d supply, fill #0

## 2023-11-29 MED ORDER — LISDEXAMFETAMINE DIMESYLATE 20 MG PO CAPS
20.0000 mg | ORAL_CAPSULE | Freq: Every day | ORAL | 0 refills | Status: AC
  Filled 2024-01-19: qty 30, 30d supply, fill #0
  Filled 2024-01-19: qty 15, 15d supply, fill #0

## 2023-11-29 MED ORDER — LISDEXAMFETAMINE DIMESYLATE 20 MG PO CAPS
20.0000 mg | ORAL_CAPSULE | Freq: Every day | ORAL | 0 refills | Status: AC
  Filled 2023-11-29: qty 30, 30d supply, fill #0

## 2023-11-29 MED ORDER — LISDEXAMFETAMINE DIMESYLATE 40 MG PO CAPS
40.0000 mg | ORAL_CAPSULE | Freq: Every day | ORAL | 0 refills | Status: DC
  Filled 2024-01-19 – 2024-02-29 (×2): qty 30, 30d supply, fill #0

## 2023-11-29 MED ORDER — LISDEXAMFETAMINE DIMESYLATE 20 MG PO CAPS
20.0000 mg | ORAL_CAPSULE | Freq: Every day | ORAL | 0 refills | Status: DC
  Filled 2024-02-29: qty 30, 30d supply, fill #0

## 2023-11-29 MED ORDER — LISDEXAMFETAMINE DIMESYLATE 40 MG PO CAPS
40.0000 mg | ORAL_CAPSULE | Freq: Every day | ORAL | 0 refills | Status: AC
  Filled 2023-11-29: qty 30, 30d supply, fill #0

## 2023-11-30 ENCOUNTER — Other Ambulatory Visit (HOSPITAL_COMMUNITY): Payer: Self-pay

## 2023-12-09 ENCOUNTER — Other Ambulatory Visit (HOSPITAL_COMMUNITY): Payer: Self-pay

## 2023-12-10 ENCOUNTER — Other Ambulatory Visit (HOSPITAL_COMMUNITY): Payer: Self-pay

## 2024-01-03 ENCOUNTER — Other Ambulatory Visit (HOSPITAL_COMMUNITY): Payer: Self-pay

## 2024-01-04 ENCOUNTER — Other Ambulatory Visit: Payer: Self-pay

## 2024-01-04 ENCOUNTER — Other Ambulatory Visit (HOSPITAL_COMMUNITY): Payer: Self-pay

## 2024-01-05 ENCOUNTER — Other Ambulatory Visit (HOSPITAL_COMMUNITY): Payer: Self-pay

## 2024-01-07 ENCOUNTER — Other Ambulatory Visit: Payer: Self-pay

## 2024-01-07 ENCOUNTER — Other Ambulatory Visit (HOSPITAL_COMMUNITY): Payer: Self-pay

## 2024-01-11 ENCOUNTER — Other Ambulatory Visit (HOSPITAL_COMMUNITY): Payer: Self-pay

## 2024-01-19 ENCOUNTER — Other Ambulatory Visit (HOSPITAL_COMMUNITY): Payer: Self-pay

## 2024-02-03 NOTE — Progress Notes (Deleted)
 No chief complaint on file.    HISTORY OF PRESENT ILLNESS:  02/03/24 ALL:  Tiffany Carson returns for follow up for chronic insomnia. She was last seen by Dr Vickey Huger 07/2023 and continued zolpidem 12.5mg  on weekends and trazodone and clonidine during the work week. Zolpidem was switched to daridorexant??? Referral to psychology placed and she was advised to try to wean Vyvanse to once daily???  Since,   01/19/2022 ALL:  Tiffany Carson returns for follow up for chronic insomnia. She continues zolpidem ER 12.5mg  QHS PRN. She is also taking trazodone 50mg  QHS PRN and clonidine 0.1mg  1-2 tablets QHS PRN prescribed by psychiatry. She reports that she takes trazodone and clonidine mostly during the week and zolpidem mostly on the weekends. She denies adverse effects. She is followed by PCP regularly. She reports BP is usually less than 130/80. She continues Vyvanse 40mg  in the am and 20mg  in the evenings. She feels that she is doing well.   07/14/2021 ALL:  Tiffany Carson is a 58 y.o. female here today for follow up for insomnia. She continues zolpidem 12.5mg  QHS as needed (usually 15-20 nights a month). She is taking clonidine 0.1mg  and trazodone 50mg  at night on nights when she is not using Ambien. She usually takes medication between 10-11pm. It usually takes her 30-40 minutes to go to sleep. She sets alarm for 4am. Alarm usually wakes her. She usually gets out of the bed around 4:45-5. She is getting 6-7 hours uninterrupted sleep. She reports feeling a little more stressed recently with her job and being a caregiver for her mother who moved in with her in 2020. She is cautious about using good sleep hygiene practices. She reports being on Ambien 15mg  daily in the past that she feels worked better for her. She has tried other sleep aids in the past that were not helpful.   She continues to follow with Dr Carmela Hurt for ADHA. She continues Vyvanse 40mg  in the mornings and 20mg  around 3pm. 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   HISTORY (copied from Dr Dohmeier's previous note)  Tiffany Carson is a 58 year old patient with chronic insomnia, no longer with focal dystonia. Ambien dependent.  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  She does not have to provide night services or emergency call.  She intends to go to PA school.  Her current Epworth sleepiness score is endorsed at 0 points and fatigue severity at 12 points.  Her insomnia is still present and chronically so but she did adjust sleep habits and sleep hygiene and has been doing well with sleep overall but using Ambien.she has taken Trazodone to alternate with Ambien on weekends, clonidin is used on weekdays ( Dr. Rosanna Randy, MD- ADHD specialist)   HPI:  Chronic Insomnia , but had been presenting with a task specific dystonia in 2015. Marland Kitchen Chief complaint according to patient : here today for Ambien refill. The patient reports that she is doing well on Ambien 12.5 in an extended release form, provided by Dr. Fabian Sharp. Meanwhile , our NP has taken tihs over.    The patient had undergone a sleep study which revealed no organic sleep disorder of significance. She is meanwhile 58 years old, she underwent a hysterectomy at age 88 and very likely went into menopause within the next 2 years. This was a time of exacerbation of insomnia. She still needs about an hour to  fall asleep after taking Ambien. Her bedtime is usually around 11:30 PM so she may fall asleep anytime between midnight and 1 AM. She does not have nocturia breaks, she usually sleeps through until 4 AM when her alarm rings. So she really doesn't gets a lot of sleep only about 4 hours with the help of medication each night. Her fatigue severity score was endorsed at 23 points the Epworth sleepiness score at 4 points both are below average not indicative of high fatigue or high sleepiness. She arrives at work at 6 AM. She takes  rarely afternoon naps, if so- 30 minutes duration.  She was given Vyvanse for adult ADD, and has tolerated this well, no tics, no twitches. 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  She is aware that the continuous use of Ambien can lead to addiction. She had sleep eating spells on Ambien.    Social history: full tie gainfully employed, reviewed sleep habits, caffeine use 1 cup in AM.  No ETOH.  Non smoker.  Lives alone - wants to move her mother into her own home.  Pets; 2 dogs   REVIEW OF SYSTEMS: Out of a complete 14 system review of symptoms, the patient complains only of the following symptoms, insomnia, and all other reviewed systems are negative.  ESS: 2 FSS: 23  ALLERGIES: Allergies  Allergen Reactions   Percocet [Oxycodone-Acetaminophen] Itching   Codeine Itching     HOME MEDICATIONS: Outpatient Medications Prior to Visit  Medication Sig Dispense Refill   cefdinir (OMNICEF) 300 MG capsule Take 1 capsule (300 mg total) by mouth 2 (two) times daily. 14 capsule 0   HYDROcodone bit-homatropine (HYCODAN) 5-1.5 MG/5ML syrup Take 5 mLs by mouth every 6 (six) hours as needed for cough. 90 mL 0   HYDROcodone-acetaminophen (NORCO/VICODIN) 5-325 MG tablet Take 1 tablet by mouth every 6 (six) hours. 5 tablet 0   lisdexamfetamine (VYVANSE) 20 MG capsule Take 1 capsule by mouth daily; May be filled 30 days after date prescribed 30 capsule 0   lisdexamfetamine (VYVANSE) 20 MG capsule Take 1 capsule by mouth daily. 30 capsule 0   lisdexamfetamine (VYVANSE) 20 MG capsule Take 1 capsule by mouth daily; 30 capsule 0   lisdexamfetamine (VYVANSE) 20 MG capsule Take 1 capsule (20 mg total) by mouth daily. 30 capsule 0   lisdexamfetamine (VYVANSE) 20 MG capsule Take 1 capsule by mouth daily 30 capsule 0   lisdexamfetamine (VYVANSE) 20 MG capsule Take 1 capsule (20 mg total) by mouth daily. 30 capsule 0   lisdexamfetamine (VYVANSE) 40  MG capsule Take 1 capsule by mouth daily 30 capsule 0   lisdexamfetamine (VYVANSE) 40 MG capsule Take 1 capsule (40 mg total) by mouth daily. 30 capsule 0   lisdexamfetamine (VYVANSE) 40 MG capsule Take 1 capsule (40 mg total) by mouth daily. 30 capsule 0   Omega-3 Fatty Acids (FISH OIL) 1000 MG CPDR Take 1 tablet by mouth daily.     predniSONE (DELTASONE) 20 MG tablet Take 2 tablets (40 mg total) by mouth daily. 10 tablet 0   traZODone (DESYREL) 50 MG tablet Take 1 tablet (50 mg total) by mouth at bedtime as needed for sleep 30 tablet 5   Vitamin D, Cholecalciferol, 400 UNITS CHEW Chew by mouth.      zolpidem (AMBIEN CR) 12.5 MG CR tablet Take 1 tablet (12.5 mg total) by mouth at bedtime as needed for sleep. 30 tablet 5  No facility-administered medications prior to visit.     PAST MEDICAL HISTORY: Past Medical History:  Diagnosis Date   ABDOMINAL PAIN RIGHT LOWER QUADRANT 10/03/2010   Acquired absence of both cervix and uterus 10/03/2010   ADHD (attention deficit hyperactivity disorder)    per dr Elisabeth Most  see notes   ANEMIA, IRON DEFICIENCY 07/02/2010   COUGH, CHRONIC 04/09/2010   pt denies   FATIGUE 06/24/2009   FIBROIDS, UTERUS 09/03/2010   GERD 08/08/2007   HYPERLIPIDEMIA 08/08/2007   Irregular menstrual cycle 06/24/2009   LEG CRAMPS 06/24/2009   LEG PAIN, RIGHT 10/03/2010   OBESITY 06/24/2009   SLEEPLESSNESS 11/16/2007   TINGLING 10/03/2010   TREMOR 07/02/2010   VITAMIN D DEFICIENCY 06/02/2010     PAST SURGICAL HISTORY: Past Surgical History:  Procedure Laterality Date   ABDOMINAL HYSTERECTOMY  nov 2011   secondary infection  had adhesions   BREAST REDUCTION SURGERY  09 20 12   BREAST SURGERY     CHOLECYSTECTOMY     CYST REMOVAL HAND     right hand 2019   OOPHORECTOMY     left   salpingesctomy    REDUCTION MAMMAPLASTY Bilateral 07/2011   TRIGGER FINGER RELEASE  08/2018   right middle finger and excision of ganglion cyst of right wrist     FAMILY HISTORY: Family History   Problem Relation Age of Onset   Diabetes Mother    Hypertension Mother    Diabetes Brother 45       2013, type 2   Arthritis Other    Hypertension Other    Colon cancer Neg Hx      SOCIAL HISTORY: Social History   Socioeconomic History   Marital status: Single    Spouse name: n/a   Number of children: 0   Years of education: 16   Highest education level: Not on file  Occupational History   Occupation: NUCLEAR MEDICINE    Employer: [employer]    Comment: [employer]  Tobacco Use   Smoking status: Never   Smokeless tobacco: Never  Vaping Use   Vaping status: Never Used  Substance and Sexual Activity   Alcohol use: Yes    Alcohol/week: 0.0 - 1.0 standard drinks of alcohol    Comment: rarely   Drug use: No   Sexual activity: Never    Birth control/protection: None  Other Topics Concern   Not on file  Social History Narrative   Lives alone, with 1 dog.   Cousins live nearby.  Siblings are in middle [place].   Neg td  ocass etoh 1 caffeine per day    40 hours  Per week.    Patient is right-handed.   Patient has a Probation officer.         Social Drivers of Corporate investment banker Strain: Not on file  Food Insecurity: Not on file  Transportation Needs: Not on file  Physical Activity: Not on file  Stress: Not on file  Social Connections: Not on file  Intimate Partner Violence: Not on file     PHYSICAL EXAM  There were no vitals filed for this visit.   There is no height or weight on file to calculate BMI.   Generalized: Well developed, in no acute distress  Neurological examination  Mentation: Alert oriented to time, place, history taking. Follows all commands speech and language fluent Cranial nerve II-XII: Pupils were equal round reactive to light. Extraocular movements were full, visual field  were full on confrontational test. Facial sensation and strength were normal. Head turning and shoulder shrug  were  normal and symmetric. Motor: The motor testing reveals 5 over 5 strength of all 4 extremities. Good symmetric motor tone is noted throughout.  Gait and station: Gait is normal.    DIAGNOSTIC DATA (LABS, IMAGING, TESTING) - I reviewed patient records, labs, notes, testing and imaging myself where available.  Lab Results  Component Value Date   WBC 10.3 11/22/2018   HGB 15.3 (H) 11/22/2018   HCT 45.4 11/22/2018   MCV 90.3 11/22/2018   PLT 426.0 (H) 11/22/2018      Component Value Date/Time   NA 139 11/22/2018 1616   K 4.6 11/22/2018 1616   CL 100 11/22/2018 1616   CO2 31 11/22/2018 1616   GLUCOSE 104 (H) 11/22/2018 1616   BUN 12 11/22/2018 1616   CREATININE 0.67 11/22/2018 1616   CALCIUM 10.2 11/22/2018 1616   PROT 7.2 12/27/2017 1422   ALBUMIN 4.6 12/27/2017 1422   AST 18 12/27/2017 1422   ALT 21 12/27/2017 1422   ALKPHOS 84 12/27/2017 1422   BILITOT 0.3 12/27/2017 1422   GFRNONAA 100.56 10/03/2010 0000   Lab Results  Component Value Date   CHOL 217 (H) 11/30/2018   HDL 58.50 11/30/2018   LDLCALC 135 (H) 11/30/2018   LDLDIRECT 135.4 06/27/2012   TRIG 114.0 11/30/2018   CHOLHDL 4 11/30/2018   Lab Results  Component Value Date   HGBA1C 6.1 11/22/2018   Lab Results  Component Value Date   VITAMINB12 521 05/04/2016   Lab Results  Component Value Date   TSH 0.97 11/22/2018        No data to display               No data to display           ASSESSMENT AND PLAN  58 y.o. year old female  has a past medical history of ABDOMINAL PAIN RIGHT LOWER QUADRANT (10/03/2010), Acquired absence of both cervix and uterus (10/03/2010), ADHD (attention deficit hyperactivity disorder), ANEMIA, IRON DEFICIENCY (07/02/2010), COUGH, CHRONIC (04/09/2010), FATIGUE (06/24/2009), FIBROIDS, UTERUS (09/03/2010), GERD (08/08/2007), HYPERLIPIDEMIA (08/08/2007), Irregular menstrual cycle (06/24/2009), LEG CRAMPS (06/24/2009), LEG PAIN, RIGHT (10/03/2010), OBESITY (06/24/2009), SLEEPLESSNESS  (11/16/2007), TINGLING (10/03/2010), TREMOR (07/02/2010), and VITAMIN D DEFICIENCY (06/02/2010). here with    No diagnosis found.  Tiffany Carson continues Ambien CR 12.5mg  QHS PRN. She also takes clonidine 0.1mg  and trazodone 50mg  PRN prescribed by Dr Elisabeth Most. She feels she is doing well and usually gets about 6 hours of sleep every night. We will continue zolpidem ER 12.5mg  daily as needed. She was advised to [place] as needed and try to avoid daily dosing. PDMP shows appropriate 90 day refills (about every 90 days), last filled 01/15/2022. She will request refills when due. She will focus on healthy lifestyle habits. She will follow up in 6 months.    No orders of the defined types were placed in this encounter.     No orders of the defined types were placed in this encounter.    Shawnie Dapper, MSN, FNP-C 02/03/2024, 10:07 AM  [facility] [address], [address] [place], [place] [address] [phone]

## 2024-02-04 ENCOUNTER — Other Ambulatory Visit (HOSPITAL_COMMUNITY): Payer: Self-pay

## 2024-02-07 ENCOUNTER — Encounter: Payer: Self-pay | Admitting: Family Medicine

## 2024-02-07 ENCOUNTER — Ambulatory Visit: Payer: Commercial Managed Care - PPO | Admitting: Family Medicine

## 2024-02-07 DIAGNOSIS — F5105 Insomnia due to other mental disorder: Secondary | ICD-10-CM

## 2024-02-28 DIAGNOSIS — M7542 Impingement syndrome of left shoulder: Secondary | ICD-10-CM | POA: Diagnosis not present

## 2024-02-29 ENCOUNTER — Other Ambulatory Visit (HOSPITAL_COMMUNITY): Payer: Self-pay

## 2024-02-29 ENCOUNTER — Other Ambulatory Visit: Payer: Self-pay | Admitting: Neurology

## 2024-02-29 DIAGNOSIS — F5105 Insomnia due to other mental disorder: Secondary | ICD-10-CM

## 2024-02-29 DIAGNOSIS — F5104 Psychophysiologic insomnia: Secondary | ICD-10-CM

## 2024-03-02 ENCOUNTER — Other Ambulatory Visit (HOSPITAL_COMMUNITY): Payer: Self-pay

## 2024-03-03 ENCOUNTER — Other Ambulatory Visit (HOSPITAL_COMMUNITY): Payer: Self-pay

## 2024-03-06 NOTE — Telephone Encounter (Signed)
 Patient needs a refill on the Ambien  and the pharmacy sent over last week. She is completley out and wanted this expedited, Thank you

## 2024-03-07 ENCOUNTER — Other Ambulatory Visit: Payer: Self-pay | Admitting: Neurology

## 2024-03-07 ENCOUNTER — Other Ambulatory Visit (HOSPITAL_COMMUNITY): Payer: Self-pay

## 2024-03-07 DIAGNOSIS — F5105 Insomnia due to other mental disorder: Secondary | ICD-10-CM

## 2024-03-07 DIAGNOSIS — F5104 Psychophysiologic insomnia: Secondary | ICD-10-CM

## 2024-03-08 ENCOUNTER — Other Ambulatory Visit (HOSPITAL_COMMUNITY): Payer: Self-pay

## 2024-03-08 MED ORDER — ZOLPIDEM TARTRATE ER 12.5 MG PO TBCR
12.5000 mg | EXTENDED_RELEASE_TABLET | Freq: Every evening | ORAL | 2 refills | Status: DC | PRN
  Filled 2024-03-08: qty 30, 30d supply, fill #0
  Filled 2024-04-07: qty 30, 30d supply, fill #1
  Filled 2024-05-04: qty 30, 30d supply, fill #2

## 2024-03-08 NOTE — Telephone Encounter (Signed)
 Last seen 07/19/23 and next f/u 05/15/24. Last refilled 02/04/24 #30.

## 2024-04-06 DIAGNOSIS — M25562 Pain in left knee: Secondary | ICD-10-CM | POA: Diagnosis not present

## 2024-04-07 ENCOUNTER — Other Ambulatory Visit (HOSPITAL_COMMUNITY): Payer: Self-pay

## 2024-04-07 MED ORDER — PREDNISONE 10 MG (21) PO TBPK
ORAL_TABLET | ORAL | 0 refills | Status: DC
  Filled 2024-04-07: qty 21, 6d supply, fill #0

## 2024-04-08 ENCOUNTER — Other Ambulatory Visit (HOSPITAL_COMMUNITY): Payer: Self-pay

## 2024-04-08 MED ORDER — LISDEXAMFETAMINE DIMESYLATE 20 MG PO CAPS
20.0000 mg | ORAL_CAPSULE | Freq: Every day | ORAL | 0 refills | Status: AC
  Filled 2024-04-08 – 2024-08-21 (×2): qty 30, 30d supply, fill #0

## 2024-04-08 MED ORDER — LISDEXAMFETAMINE DIMESYLATE 40 MG PO CAPS
40.0000 mg | ORAL_CAPSULE | Freq: Every day | ORAL | 0 refills | Status: AC
  Filled 2024-04-08 (×2): qty 30, 30d supply, fill #0

## 2024-04-08 MED ORDER — LISDEXAMFETAMINE DIMESYLATE 40 MG PO CAPS
40.0000 mg | ORAL_CAPSULE | Freq: Every day | ORAL | 0 refills | Status: AC
  Filled 2024-04-08 – 2024-08-21 (×2): qty 30, 30d supply, fill #0

## 2024-04-08 MED ORDER — LISDEXAMFETAMINE DIMESYLATE 20 MG PO CAPS
20.0000 mg | ORAL_CAPSULE | Freq: Every day | ORAL | 0 refills | Status: AC
  Filled 2024-04-08 (×2): qty 30, 30d supply, fill #0

## 2024-04-10 DIAGNOSIS — M7542 Impingement syndrome of left shoulder: Secondary | ICD-10-CM | POA: Diagnosis not present

## 2024-04-20 ENCOUNTER — Other Ambulatory Visit (HOSPITAL_COMMUNITY): Payer: Self-pay

## 2024-04-24 ENCOUNTER — Other Ambulatory Visit (HOSPITAL_COMMUNITY): Payer: Self-pay

## 2024-04-24 DIAGNOSIS — M25562 Pain in left knee: Secondary | ICD-10-CM | POA: Diagnosis not present

## 2024-04-24 DIAGNOSIS — M25362 Other instability, left knee: Secondary | ICD-10-CM | POA: Diagnosis not present

## 2024-04-24 MED ORDER — CELECOXIB 100 MG PO CAPS
100.0000 mg | ORAL_CAPSULE | Freq: Two times a day (BID) | ORAL | 0 refills | Status: AC
  Filled 2024-04-24: qty 60, 30d supply, fill #0

## 2024-05-03 ENCOUNTER — Other Ambulatory Visit (HOSPITAL_COMMUNITY): Payer: Self-pay

## 2024-05-04 ENCOUNTER — Other Ambulatory Visit (HOSPITAL_COMMUNITY): Payer: Self-pay

## 2024-05-04 ENCOUNTER — Other Ambulatory Visit: Payer: Self-pay

## 2024-05-15 ENCOUNTER — Encounter: Payer: Self-pay | Admitting: Family Medicine

## 2024-05-15 ENCOUNTER — Ambulatory Visit: Admitting: Family Medicine

## 2024-05-15 VITALS — BP 139/91 | HR 96 | Ht 63.0 in | Wt 193.0 lb

## 2024-05-15 DIAGNOSIS — F5104 Psychophysiologic insomnia: Secondary | ICD-10-CM

## 2024-05-15 NOTE — Patient Instructions (Signed)
 Below is our plan:  We will continue Ambien  CR 12.5mg  daily. Try taking 1/2 tablet during the week.   Please make sure you are staying well hydrated. I recommend 50-60 ounces daily. Well balanced diet and regular exercise encouraged. Consistent sleep schedule with 6-8 hours recommended.   Please continue follow up with care team as directed.   Follow up with me or Dohmeier in 6-8 months   You may receive a survey regarding today's visit. I encourage you to leave honest feed back as I do use this information to improve patient care. Thank you for seeing me today!

## 2024-05-15 NOTE — Progress Notes (Signed)
 Chief Complaint  Patient presents with   RM1/CHRONIC INSOMNIA    Pt is here Alone. Pt states that things have been pretty good.      HISTORY OF PRESENT ILLNESS:  05/15/24 ALL:  Tiffany Carson returns for follow up for insomnia. She was last seen by Dr Chalice 07/2023. Ambien  was switched to daridorexant  50mg  daily. She was unable to get it covered and has continued Ambien .   Since, she reports doing well. She continues Ambien  CR, mostly on weekends. She has not used as much during the week as she is afraid she will sleep through her alarm. She sleeps about 3-4 hours during the week then sleeps about 9-10 hours on the weekends. She continues Vyvanse  per psychiatry. No longer taking clonidine  and trazodone . Not effective anymore. BP usually 110/80.   07/19/2023 CD: Tiffany Carson is a 58 y.o. female patient who is here for revisit 07/19/2023 for peri-menopausal induced insomnia. Has never been seen by cognitive behavioral therapist.  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 I like to know what is new on the market.  This patient is taking Vyvanse  for ADD, ADHD.    Tiffany Carson is a 58 y.o. female , seen here as a revisit  from Dr. Charlett for  Insomnia, 07-01-2020.   History of Present Illness: Tiffany Carson is a 58 year old patient with chronic insomnia, no longer with focal dystonia. Ambien  dependent.  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  She does not have to provide night services or emergency call.  She intends to go to PA school.  Her current Epworth sleepiness score is endorsed at 0 points and fatigue severity at 12 points.  Her insomnia is still present and chronically so but she did adjust sleep habits and sleep hygiene and has been doing well with sleep overall but using Ambien .she has taken  Trazodone  to alternate with Ambien  on weekends, clonidin is used on weekdays ( Dr. Greig Cave, MD- ADHD specialist)   01/19/2022 ALL:  Tiffany Carson returns for follow up for chronic insomnia. She continues zolpidem  ER 12.5mg  QHS PRN. She is also taking trazodone  50mg  QHS PRN and clonidine  0.1mg  1-2 tablets QHS PRN prescribed by psychiatry. She reports that she takes trazodone  and clonidine  mostly during the week and zolpidem  mostly on the weekends. She denies adverse effects. She is followed by PCP regularly. She reports BP is usually less than 130/80. She continues Vyvanse  40mg  in the am and 20mg  in the evenings. She feels that she is doing well.   07/14/2021 ALL:  Tiffany Carson is a 58 y.o. female here today for follow up for insomnia. She continues zolpidem  12.5mg  QHS as needed (usually 15-20 nights a month). She is taking clonidine  0.1mg  and trazodone  50mg  at night on nights when she is not using Ambien . She usually takes medication between 10-11pm. It usually takes her 30-40 minutes to go to sleep. She sets alarm for 4am. Alarm usually wakes her. She usually gets out of the bed around 4:45-5. She is getting 6-7 hours uninterrupted sleep. She reports feeling a little more stressed recently with her job and being a caregiver for her mother who moved in with her in 2020. She is cautious about using good sleep hygiene practices. She reports being on Ambien  15mg  daily  in the past that she feels worked better for her. She has tried other sleep aids in the past that were not helpful.   She continues to follow with Dr Osa for ADHA. She continues Vyvanse  40mg  in the mornings and 20mg  around 3pm. 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 She works four 10 hour days.   HISTORY (copied from Dr Dohmeier's previous note)  Tiffany Carson is a 58 year old patient with chronic insomnia, no longer with focal dystonia. Ambien  dependent.  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  She does not have to provide night services or emergency call.  She intends to go to PA school.  Her current Epworth sleepiness score is endorsed at 0 points and fatigue severity at 12 points.  Her insomnia is still present and chronically so but she did adjust sleep habits and sleep hygiene and has been doing well with sleep overall but using Ambien .she has taken Trazodone  to alternate with Ambien  on weekends, clonidin is used on weekdays ( Dr. Greig Osa, MD- ADHD specialist)   HPI:  Chronic Insomnia , but had been presenting with a task specific dystonia in 2015. SABRA Chief complaint according to patient : here today for Ambien  refill. The patient reports that she is doing well on Ambien  12.5 in an extended release form, provided by Dr. Charlett. Meanwhile , our NP has taken tihs over.    The patient had undergone a sleep study which revealed no organic sleep disorder of significance. She is meanwhile 58 years old, she underwent a hysterectomy at age 45 and very likely went into menopause within the next 2 years. This was a time of exacerbation of insomnia. She still needs about an hour to fall asleep after taking Ambien . Her bedtime is usually around 11:30 PM so she may fall asleep anytime between midnight and 1 AM. She does not have nocturia breaks, she usually sleeps through until 4 AM when her alarm rings. So she really doesn't gets a lot of sleep only about 4 hours with the help of medication each night. Her fatigue severity score was endorsed at 23 points the Epworth sleepiness score at 4 points both are below average not indicative of high fatigue or high sleepiness. She arrives at work at 6 AM. She takes rarely afternoon naps, if so- 30 minutes duration.  She was given Vyvanse  for adult ADD, and has tolerated this well, no tics, no twitches. 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  She is aware that the continuous use of Ambien  can  lead to addiction. She had sleep eating spells on Ambien .    Social history: full tie gainfully employed, reviewed sleep habits, caffeine use 1 cup in AM.  No ETOH.  Non smoker.  Lives alone - wants to move her mother into her own home.  Pets; 2 dogs   REVIEW OF SYSTEMS: Out of a complete 14 system review of symptoms, the patient complains only of the following symptoms, insomnia, and all other reviewed systems are negative.  ESS: 2 FSS: 23  ALLERGIES: Allergies  Allergen Reactions   Percocet [Oxycodone-Acetaminophen ] Itching   Codeine Itching     HOME MEDICATIONS: Outpatient Medications Prior to Visit  Medication Sig Dispense Refill   celecoxib  (CELEBREX ) 100 MG capsule Take 1 capsule (100 mg total) by mouth 2 (two) times daily for 30 days. 60 capsule 0   HYDROcodone -acetaminophen  (NORCO/VICODIN)  5-325 MG tablet Take 1 tablet by mouth every 6 (six) hours. 5 tablet 0   lisdexamfetamine (VYVANSE ) 20 MG capsule Take 1 capsule by mouth daily; May be filled 30 days after date prescribed 30 capsule 0   lisdexamfetamine (VYVANSE ) 20 MG capsule Take 1 capsule by mouth daily. 30 capsule 0   lisdexamfetamine (VYVANSE ) 20 MG capsule Take 1 capsule by mouth daily; 30 capsule 0   lisdexamfetamine (VYVANSE ) 20 MG capsule Take 1 capsule by mouth daily 30 capsule 0   lisdexamfetamine (VYVANSE ) 20 MG capsule Take 1 capsule (20 mg total) by mouth daily. 30 capsule 0   lisdexamfetamine (VYVANSE ) 20 MG capsule Take 1 capsule (20 mg total) by mouth daily May be filled 30 days after date prescribed 30 capsule 0   lisdexamfetamine (VYVANSE ) 20 MG capsule Take 1 capsule by mouth daily 30 capsule 0   lisdexamfetamine (VYVANSE ) 40 MG capsule Take 1 capsule by mouth daily 30 capsule 0   lisdexamfetamine (VYVANSE ) 40 MG capsule Take 1 capsule (40 mg total) by mouth daily. 30 capsule 0   lisdexamfetamine (VYVANSE ) 40 MG capsule Take 1 capsule (40 mg total) by mouth dailyMay be filled 30 days after date  prescribed 30 capsule 0   lisdexamfetamine (VYVANSE ) 40 MG capsule Take 1 capsule by mouth daily 30 capsule 0   Omega-3 Fatty Acids (FISH OIL) 1000 MG CPDR Take 1 tablet by mouth daily.     predniSONE  (DELTASONE ) 20 MG tablet Take 2 tablets (40 mg total) by mouth daily. 10 tablet 0   Vitamin D , Cholecalciferol, 400 UNITS CHEW Chew by mouth.      zolpidem  (AMBIEN  CR) 12.5 MG CR tablet Take 1 tablet (12.5 mg total) by mouth at bedtime as needed for sleep. 30 tablet 2   cefdinir  (OMNICEF ) 300 MG capsule Take 1 capsule (300 mg total) by mouth 2 (two) times daily. (Patient not taking: Reported on 05/15/2024) 14 capsule 0   HYDROcodone  bit-homatropine (HYCODAN) 5-1.5 MG/5ML syrup Take 5 mLs by mouth every 6 (six) hours as needed for cough. (Patient not taking: Reported on 05/15/2024) 90 mL 0   traZODone  (DESYREL ) 50 MG tablet Take 1 tablet (50 mg total) by mouth at bedtime as needed for sleep (Patient not taking: Reported on 05/15/2024) 30 tablet 5   No facility-administered medications prior to visit.     PAST MEDICAL HISTORY: Past Medical History:  Diagnosis Date   ABDOMINAL PAIN RIGHT LOWER QUADRANT 10/03/2010   Acquired absence of both cervix and uterus 10/03/2010   ADHD (attention deficit hyperactivity disorder)    per dr Elouise  see notes   ANEMIA, IRON DEFICIENCY 07/02/2010   COUGH, CHRONIC 04/09/2010   pt denies   FATIGUE 06/24/2009   FIBROIDS, UTERUS 09/03/2010   GERD 08/08/2007   HYPERLIPIDEMIA 08/08/2007   Irregular menstrual cycle 06/24/2009   LEG CRAMPS 06/24/2009   LEG PAIN, RIGHT 10/03/2010   OBESITY 06/24/2009   SLEEPLESSNESS 11/16/2007   TINGLING 10/03/2010   TREMOR 07/02/2010   VITAMIN D  DEFICIENCY 06/02/2010     PAST SURGICAL HISTORY: Past Surgical History:  Procedure Laterality Date   ABDOMINAL HYSTERECTOMY  nov 2011   secondary infection  had adhesions   BREAST REDUCTION SURGERY  09 20 12   BREAST SURGERY     CHOLECYSTECTOMY     CYST REMOVAL HAND     right hand 2019    OOPHORECTOMY     left   salpingesctomy    REDUCTION MAMMAPLASTY Bilateral 07/2011   TRIGGER  FINGER RELEASE  08/2018   right middle finger and excision of ganglion cyst of right wrist     FAMILY HISTORY: Family History  Problem Relation Age of Onset   Diabetes Mother    Hypertension Mother    Diabetes Brother 74       2013, type 2   Arthritis Other    Hypertension Other    Colon cancer Neg Hx      SOCIAL HISTORY: Social History   Socioeconomic History   Marital status: Single    Spouse name: n/a   Number of children: 0   Years of education: 16   Highest education level: Not on file  Occupational History   Occupation: NUCLEAR MEDICINE    Employer: [employer]    Comment: [employer]  Tobacco Use   Smoking status: Never   Smokeless tobacco: Never  Vaping Use   Vaping status: Never Used  Substance and Sexual Activity   Alcohol use: Yes    Alcohol/week: 0.0 - 1.0 standard drinks of alcohol    Comment: rarely   Drug use: No   Sexual activity: Never    Birth control/protection: None  Other Topics Concern   Not on file  Social History Narrative   Lives alone, with 1 dog.   Cousins live nearby.  Siblings are in [place] .   Neg td  ocass etoh 1 caffeine per day    40 hours  Per week.    Patient is right-handed.   Patient has a Probation officer.         Social Drivers of Corporate investment banker Strain: Not on file  Food Insecurity: Not on file  Transportation Needs: Not on file  Physical Activity: Not on file  Stress: Not on file  Social Connections: Not on file  Intimate Partner Violence: Not on file     PHYSICAL EXAM  Vitals:   05/15/24 1358  BP: (!) 139/91  Pulse: 96  SpO2: 98%  Weight: 193 lb (87.5 kg)  Height: 5' 3 (1.6 m)     Body mass index is 34.19 kg/m.   Generalized: Well developed, in no acute distress  Neurological examination  Mentation: Alert oriented to time, place, history  taking. Follows all commands speech and language fluent Cranial nerve II-XII: Pupils were equal round reactive to light. Extraocular movements were full, visual field were full on confrontational test. Facial sensation and strength were normal. Head turning and shoulder shrug  were normal and symmetric. Motor: The motor testing reveals 5 over 5 strength of all 4 extremities. Good symmetric motor tone is noted throughout.  Gait and station: Gait is normal.    DIAGNOSTIC DATA (LABS, IMAGING, TESTING) - I reviewed patient records, labs, notes, testing and imaging myself where available.  Lab Results  Component Value Date   WBC 10.3 11/22/2018   HGB 15.3 (H) 11/22/2018   HCT 45.4 11/22/2018   MCV 90.3 11/22/2018   PLT 426.0 (H) 11/22/2018      Component Value Date/Time   NA 139 11/22/2018 1616   K 4.6 11/22/2018 1616   CL 100 11/22/2018 1616   CO2 31 11/22/2018 1616   GLUCOSE 104 (H) 11/22/2018 1616   BUN 12 11/22/2018 1616   CREATININE 0.67 11/22/2018 1616   CALCIUM 10.2 11/22/2018 1616   PROT 7.2 12/27/2017 1422   ALBUMIN 4.6 12/27/2017 1422   AST 18 12/27/2017 1422   ALT 21 12/27/2017 1422   ALKPHOS 84 12/27/2017  1422   BILITOT 0.3 12/27/2017 1422   GFRNONAA 100.56 10/03/2010 0000   Lab Results  Component Value Date   CHOL 217 (H) 11/30/2018   HDL 58.50 11/30/2018   LDLCALC 135 (H) 11/30/2018   LDLDIRECT 135.4 06/27/2012   TRIG 114.0 11/30/2018   CHOLHDL 4 11/30/2018   Lab Results  Component Value Date   HGBA1C 6.1 11/22/2018   Lab Results  Component Value Date   VITAMINB12 521 05/04/2016   Lab Results  Component Value Date   TSH 0.97 11/22/2018        No data to display               No data to display           ASSESSMENT AND PLAN  58 y.o. year old female  has a past medical history of ABDOMINAL PAIN RIGHT LOWER QUADRANT (10/03/2010), Acquired absence of both cervix and uterus (10/03/2010), ADHD (attention deficit hyperactivity disorder),  ANEMIA, IRON DEFICIENCY (07/02/2010), COUGH, CHRONIC (04/09/2010), FATIGUE (06/24/2009), FIBROIDS, UTERUS (09/03/2010), GERD (08/08/2007), HYPERLIPIDEMIA (08/08/2007), Irregular menstrual cycle (06/24/2009), LEG CRAMPS (06/24/2009), LEG PAIN, RIGHT (10/03/2010), OBESITY (06/24/2009), SLEEPLESSNESS (11/16/2007), TINGLING (10/03/2010), TREMOR (07/02/2010), and VITAMIN D  DEFICIENCY (06/02/2010). here with    Chronic insomnia  Vallory reports doing well. We will continue zolpidem  ER 12.5mg  daily as needed. She was advised to try taking 1/2 tablet during the work week to see if she can more sleep. PDMP shows appropriate 90 day refills. She will request refills when due. She will focus on healthy lifestyle habits. She will follow up in 8 months.    No orders of the defined types were placed in this encounter.     No orders of the defined types were placed in this encounter.  I personally spent a total of 30 minutes in the care of the patient today including preparing to see the patient, getting/reviewing separately obtained history, performing a medically appropriate exam/evaluation, counseling and educating, and documenting clinical information in the EHR.   Greig Forbes, MSN, FNP-C 05/15/2024, 2:44 PM  [facility] [address] [place], [place] [address] [phone]

## 2024-05-29 ENCOUNTER — Other Ambulatory Visit (HOSPITAL_COMMUNITY): Payer: Self-pay

## 2024-05-29 DIAGNOSIS — F902 Attention-deficit hyperactivity disorder, combined type: Secondary | ICD-10-CM | POA: Diagnosis not present

## 2024-05-29 DIAGNOSIS — Z79899 Other long term (current) drug therapy: Secondary | ICD-10-CM | POA: Diagnosis not present

## 2024-05-29 MED ORDER — LISDEXAMFETAMINE DIMESYLATE 20 MG PO CAPS
20.0000 mg | ORAL_CAPSULE | Freq: Every day | ORAL | 0 refills | Status: AC
  Filled 2024-05-29: qty 30, 30d supply, fill #0

## 2024-05-29 MED ORDER — LISDEXAMFETAMINE DIMESYLATE 40 MG PO CAPS
40.0000 mg | ORAL_CAPSULE | Freq: Every day | ORAL | 0 refills | Status: AC
  Filled 2024-05-29: qty 30, 30d supply, fill #0

## 2024-05-29 MED ORDER — LISDEXAMFETAMINE DIMESYLATE 40 MG PO CAPS
40.0000 mg | ORAL_CAPSULE | Freq: Every day | ORAL | 0 refills | Status: AC
  Filled 2024-05-29 – 2024-10-06 (×2): qty 30, 30d supply, fill #0

## 2024-05-29 MED ORDER — LISDEXAMFETAMINE DIMESYLATE 20 MG PO CAPS
20.0000 mg | ORAL_CAPSULE | Freq: Every day | ORAL | 0 refills | Status: AC
  Filled 2024-07-07: qty 30, 30d supply, fill #0

## 2024-05-29 MED ORDER — LISDEXAMFETAMINE DIMESYLATE 40 MG PO CAPS
40.0000 mg | ORAL_CAPSULE | Freq: Every day | ORAL | 0 refills | Status: AC
  Filled 2024-07-07: qty 30, 30d supply, fill #0

## 2024-05-29 MED ORDER — LISDEXAMFETAMINE DIMESYLATE 20 MG PO CAPS
20.0000 mg | ORAL_CAPSULE | Freq: Every day | ORAL | 0 refills | Status: AC
  Filled 2024-07-07 – 2024-10-06 (×3): qty 30, 30d supply, fill #0

## 2024-06-02 ENCOUNTER — Other Ambulatory Visit (HOSPITAL_COMMUNITY): Payer: Self-pay

## 2024-06-05 ENCOUNTER — Other Ambulatory Visit: Payer: Self-pay | Admitting: Neurology

## 2024-06-05 ENCOUNTER — Other Ambulatory Visit (HOSPITAL_COMMUNITY): Payer: Self-pay

## 2024-06-05 DIAGNOSIS — F5105 Insomnia due to other mental disorder: Secondary | ICD-10-CM

## 2024-06-05 DIAGNOSIS — F5104 Psychophysiologic insomnia: Secondary | ICD-10-CM

## 2024-06-08 ENCOUNTER — Other Ambulatory Visit (HOSPITAL_COMMUNITY): Payer: Self-pay

## 2024-06-08 MED ORDER — ZOLPIDEM TARTRATE ER 12.5 MG PO TBCR
12.5000 mg | EXTENDED_RELEASE_TABLET | Freq: Every evening | ORAL | 2 refills | Status: DC | PRN
  Filled 2024-06-08: qty 30, 30d supply, fill #0
  Filled 2024-07-08: qty 30, 30d supply, fill #1
  Filled 2024-08-09: qty 30, 30d supply, fill #2

## 2024-06-08 NOTE — Telephone Encounter (Signed)
 Last seen 05/15/24, next appt 01/15/25  Dispenses   Dispensed Days Supply Quantity Provider Pharmacy  zolpidem  (AMBIEN  CR) 12.5 MG CR tablet 05/04/2024 30 30 tablet Dohmeier, Dedra, MD [place] [facility]  zolpidem  (AMBIEN  CR) 12.5 MG CR tablet 04/07/2024 30 30 tablet Dohmeier, Dedra, MD [place] [facility]  zolpidem  (AMBIEN  CR) 12.5 MG CR tablet 03/08/2024 30 30 tablet Dohmeier, Dedra, MD [place] [facility]  zolpidem  (AMBIEN  CR) 12.5 MG CR tablet 02/04/2024 30 30 tablet Dohmeier, Dedra, MD [place] [facility]  zolpidem  (AMBIEN  CR) 12.5 MG CR tablet 01/07/2024 30 30 tablet Dohmeier, Dedra, MD [place] [facility]  zolpidem  (AMBIEN  CR) 12.5 MG CR tablet 12/10/2023 30 30 tablet Dohmeier, Dedra, MD [place] [facility]  zolpidem  (AMBIEN  CR) 12.5 MG CR tablet 11/12/2023 30 30 tablet Dohmeier, Dedra, MD [place] [facility]  zolpidem  (AMBIEN  CR) 12.5 MG CR tablet 10/15/2023 30 30 tablet Dohmeier, Dedra, MD [place] [facility]  zolpidem  (AMBIEN  CR) 12.5 MG CR tablet 09/17/2023 30 30 tablet Dohmeier, Dedra, MD [place] [facility]  zolpidem  (AMBIEN  CR) 12.5 MG CR tablet 07/30/2023 30 30 tablet Dohmeier, Dedra, MD  [facility]  zolpidem  (AMBIEN  CR) 12.5 MG CR tablet 07/02/2023 30 30 tablet Dohmeier, Dedra, MD  [facility]    Please escribe

## 2024-06-10 ENCOUNTER — Other Ambulatory Visit (HOSPITAL_COMMUNITY): Payer: Self-pay

## 2024-06-12 DIAGNOSIS — M6281 Muscle weakness (generalized): Secondary | ICD-10-CM | POA: Diagnosis not present

## 2024-06-12 DIAGNOSIS — M25562 Pain in left knee: Secondary | ICD-10-CM | POA: Diagnosis not present

## 2024-06-19 DIAGNOSIS — M6281 Muscle weakness (generalized): Secondary | ICD-10-CM | POA: Diagnosis not present

## 2024-06-19 DIAGNOSIS — M25562 Pain in left knee: Secondary | ICD-10-CM | POA: Diagnosis not present

## 2024-06-26 ENCOUNTER — Ambulatory Visit (HOSPITAL_BASED_OUTPATIENT_CLINIC_OR_DEPARTMENT_OTHER): Attending: Physical Therapy | Admitting: Physical Therapy

## 2024-06-26 NOTE — Therapy (Deleted)
 OUTPATIENT PHYSICAL THERAPY LOWER EXTREMITY EVALUATION   Patient Name: Tiffany Carson MRN: [id number] DOB:1966/08/16, 58 y.o., female Today's Date: 06/26/2024  END OF SESSION:   Past Medical History:  Diagnosis Date   ABDOMINAL PAIN RIGHT LOWER QUADRANT 10/03/2010   Acquired absence of both cervix and uterus 10/03/2010   ADHD (attention deficit hyperactivity disorder)    per dr Elouise  see notes   ANEMIA, IRON DEFICIENCY 07/02/2010   COUGH, CHRONIC 04/09/2010   pt denies   FATIGUE 06/24/2009   FIBROIDS, UTERUS 09/03/2010   GERD 08/08/2007   HYPERLIPIDEMIA 08/08/2007   Irregular menstrual cycle 06/24/2009   LEG CRAMPS 06/24/2009   LEG PAIN, RIGHT 10/03/2010   OBESITY 06/24/2009   SLEEPLESSNESS 11/16/2007   TINGLING 10/03/2010   TREMOR 07/02/2010   VITAMIN D  DEFICIENCY 06/02/2010   Past Surgical History:  Procedure Laterality Date   ABDOMINAL HYSTERECTOMY  nov 2011   secondary infection  had adhesions   BREAST REDUCTION SURGERY  09 20 12   BREAST SURGERY     CHOLECYSTECTOMY     CYST REMOVAL HAND     right hand 2019   OOPHORECTOMY     left   salpingesctomy    REDUCTION MAMMAPLASTY Bilateral 07/2011   TRIGGER FINGER RELEASE  08/2018   right middle finger and excision of ganglion cyst of right wrist   Patient Active Problem List   Diagnosis Date Noted   Dysfunction of left eustachian tube 11/04/2023   Ear itch 11/04/2023   Otalgia, left ear 11/04/2023   Varicose veins of left lower extremity with complications 03/24/2016   Dystonia, torsion, fragments of 06/17/2015   Insomnia due to mental condition 06/17/2015   Obesity 06/17/2015   Akinetic rigid syndrome 12/26/2014   Writer's cramp 12/26/2014   Snoring 04/09/2014   Swelling of joint of right knee 03/30/2014   Chronic insomnia 02/19/2014   Primary snoring 02/19/2014   Focal dystonia 02/19/2014   Task-specific dystonia of hand 02/19/2014   Hand pain, right 12/04/2013   Visit for preventive health examination 01/09/2013    Medial knee pain 11/30/2012   Gynecomastia, female 02/20/2011   Neck pain 02/20/2011   ACQUIRED ABSENCE OF BOTH CERVIX AND UTERUS 10/03/2010   Abnormal involuntary movement 07/02/2010   Vitamin D  deficiency 06/02/2010   OBESITY 06/24/2009   SLEEPLESSNESS 11/16/2007   GERD 08/08/2007    PCP: Apolinar Eastern MD   REFERRING PROVIDER: Gladis Citron MD  REFERRING DIAG:  Diagnosis  M25.562 (ICD-10-CM) - Pain in left knee    THERAPY DIAG:  No diagnosis found.  Rationale for Evaluation and Treatment: Rehabilitation  ONSET DATE:   SUBJECTIVE:   SUBJECTIVE STATEMENT: ***  PERTINENT HISTORY: ADHD, fatigue , leg cramps, tremors,  PAIN:  Are you having pain? Yes: NPRS scale: *** Pain location: *** Pain description: *** Aggravating factors: *** Relieving factors: ***  PRECAUTIONS: {Therapy precautions:24002}  RED FLAGS: {PT Red Flags:29287}   WEIGHT BEARING RESTRICTIONS: {Yes ***/No:24003}  FALLS:  Has patient fallen in last 6 months? {fallsyesno:27318}  LIVING ENVIRONMENT: Lives with: {OPRC lives with:25569::lives with their family} Lives in: {Lives in:25570} Stairs: {opstairs:27293} Has following equipment at home: {Assistive devices:23999}  OCCUPATION: ***  PLOF: {PLOF:24004}  PATIENT GOALS: ***  NEXT MD VISIT: ***  OBJECTIVE:  Note: Objective measures were completed at Evaluation unless otherwise noted.  DIAGNOSTIC FINDINGS: ***  PATIENT SURVEYS:  LEFS  Extreme difficulty/unable (0), Quite a bit of difficulty (1), Moderate difficulty (2), Little difficulty (3), No difficulty (4) Survey date:  Any of your usual work, housework or school activities   2. Usual hobbies, recreational or sporting activities   3. Getting into/out of the bath   4. Walking between rooms   5. Putting on socks/shoes   6. Squatting    7. Lifting an object, like a bag of groceries from the floor   8. Performing light activities around your home   9. Performing heavy  activities around your home   10. Getting into/out of a car   11. Walking 2 blocks   12. Walking 1 mile   13. Going up/down 10 stairs (1 flight)   14. Standing for 1 hour   15.  sitting for 1 hour   16. Running on even ground   17. Running on uneven ground   18. Making sharp turns while running fast   19. Hopping    20. Rolling over in bed   Score total:  ***     COGNITION: Overall cognitive status: {cognition:24006}     SENSATION: {sensation:27233}  EDEMA:  {edema:24020}  MUSCLE LENGTH: Hamstrings: Right *** deg; Left *** deg Debby test: Right *** deg; Left *** deg  POSTURE: {posture:25561}  PALPATION: ***  LOWER EXTREMITY ROM:  {AROM/PROM:27142} ROM Right eval Left eval  Hip flexion    Hip extension    Hip abduction    Hip adduction    Hip internal rotation    Hip external rotation    Knee flexion    Knee extension    Ankle dorsiflexion    Ankle plantarflexion    Ankle inversion    Ankle eversion     (Blank rows = not tested)  LOWER EXTREMITY MMT:  MMT Right eval Left eval  Hip flexion    Hip extension    Hip abduction    Hip adduction    Hip internal rotation    Hip external rotation    Knee flexion    Knee extension    Ankle dorsiflexion    Ankle plantarflexion    Ankle inversion    Ankle eversion     (Blank rows = not tested)  LOWER EXTREMITY SPECIAL TESTS:  {LEspecialtests:26242}  FUNCTIONAL TESTS:  {Functional tests:24029}  GAIT: Distance walked: *** Assistive device utilized: {Assistive devices:23999} Level of assistance: {Levels of assistance:24026} Comments: ***                                                                                                                                TREATMENT DATE: ***    PATIENT EDUCATION:  Education details: *** Person educated: {Person educated:25204} Education method: {Education Method:25205} Education comprehension: {Education Comprehension:25206}  HOME EXERCISE  PROGRAM: ***  ASSESSMENT:  CLINICAL IMPRESSION: Patient is a *** y.o. *** who was seen today for physical therapy evaluation and treatment for ***.   OBJECTIVE IMPAIRMENTS: {opptimpairments:25111}.   ACTIVITY LIMITATIONS: {activitylimitations:27494}  PARTICIPATION LIMITATIONS: {participationrestrictions:25113}  PERSONAL FACTORS: {Personal factors:25162} are also affecting patient's functional outcome.  REHAB POTENTIAL: Good  CLINICAL DECISION MAKING: {clinical decision making:25114}  EVALUATION COMPLEXITY: {Evaluation complexity:25115}   GOALS: Goals reviewed with patient? {yes/no:20286}  SHORT TERM GOALS: Target date: *** *** Baseline: Goal status: INITIAL  2.  *** Baseline:  Goal status: INITIAL  3.  *** Baseline:  Goal status: INITIAL  4.  *** Baseline:  Goal status: INITIAL  5.  *** Baseline:  Goal status: INITIAL  6.  *** Baseline:  Goal status: INITIAL  LONG TERM GOALS: Target date: ***  *** Baseline:  Goal status: INITIAL  2.  *** Baseline:  Goal status: INITIAL  3.  *** Baseline:  Goal status: INITIAL  4.  *** Baseline:  Goal status: INITIAL  5.  *** Baseline:  Goal status: INITIAL  6.  *** Baseline:  Goal status: INITIAL   PLAN:  PT FREQUENCY: 2x/week  PT DURATION: 8 weeks  PLANNED INTERVENTIONS: {rehab planned interventions:25118::97110-Therapeutic exercises,97530- Therapeutic [phone]- Neuromuscular re-education,97535- Self Rjmz,02859- Manual therapy}  PLAN FOR NEXT SESSION: ***   Alm JINNY Don, PT 06/26/2024, 8:15 AM

## 2024-07-07 ENCOUNTER — Other Ambulatory Visit (HOSPITAL_COMMUNITY): Payer: Self-pay

## 2024-07-08 ENCOUNTER — Other Ambulatory Visit (HOSPITAL_COMMUNITY): Payer: Self-pay

## 2024-07-10 ENCOUNTER — Other Ambulatory Visit: Payer: Self-pay

## 2024-07-10 DIAGNOSIS — M25562 Pain in left knee: Secondary | ICD-10-CM | POA: Diagnosis not present

## 2024-07-10 DIAGNOSIS — M6281 Muscle weakness (generalized): Secondary | ICD-10-CM | POA: Diagnosis not present

## 2024-08-04 ENCOUNTER — Telehealth: Admitting: Physician Assistant

## 2024-08-04 ENCOUNTER — Other Ambulatory Visit (HOSPITAL_COMMUNITY): Payer: Self-pay

## 2024-08-04 DIAGNOSIS — M545 Low back pain, unspecified: Secondary | ICD-10-CM | POA: Diagnosis not present

## 2024-08-04 MED ORDER — CYCLOBENZAPRINE HCL 10 MG PO TABS
5.0000 mg | ORAL_TABLET | Freq: Three times a day (TID) | ORAL | 0 refills | Status: AC | PRN
  Filled 2024-08-04: qty 30, 10d supply, fill #0

## 2024-08-04 MED ORDER — METHYLPREDNISOLONE 4 MG PO TBPK
ORAL_TABLET | ORAL | 0 refills | Status: AC
  Filled 2024-08-04: qty 21, 6d supply, fill #0

## 2024-08-04 NOTE — Progress Notes (Signed)
 We are sorry that you are not feeling well.  Here is how we plan to help!  Based on what you have shared with me it looks like you mostly have acute back pain.  Acute back pain is defined as musculoskeletal pain that can resolve in 1-3 weeks with conservative treatment.  I have prescribed Medrol dose pack Take as directed on package instructions, a steroid anti-inflammatory, as well as Flexeril  10 mg every eight hours as needed which is a muscle relaxer  Some patients experience stomach irritation or in increased heartburn with anti-inflammatory drugs.  Please keep in mind that muscle relaxer's can cause fatigue and should not be taken while at work or driving.  Back pain is very common.  The pain often gets better over time.  The cause of back pain is usually not dangerous.  Most people can learn to manage their back pain on their own.  Home Care Stay active.  Start with short walks on flat ground if you can.  Try to walk farther each day. Do not sit, drive or stand in one place for more than 30 minutes.  Do not stay in bed. Do not avoid exercise or work.  Activity can help your back heal faster. Be careful when you bend or lift an object.  Bend at your knees, keep the object close to you, and do not twist. Sleep on a firm mattress.  Lie on your side, and bend your knees.  If you lie on your back, put a pillow under your knees. Only take medicines as told by your doctor. Put ice on the injured area. Put ice in a plastic bag Place a towel between your skin and the bag Leave the ice on for 15-20 minutes, 3-4 times a day for the first 2-3 days. 210 After that, you can switch between ice and heat packs. Ask your doctor about back exercises or massage. Avoid feeling anxious or stressed.  Find good ways to deal with stress, such as exercise.  Get Help Right Way If: Your pain does not go away with rest or medicine. Your pain does not go away in 1 week. You have new problems. You do not feel  well. The pain spreads into your legs. You cannot control when you poop (bowel movement) or pee (urinate) You feel sick to your stomach (nauseous) or throw up (vomit) You have belly (abdominal) pain. You feel like you may pass out (faint). If you develop a fever.  Make Sure you: Understand these instructions. Will watch your condition Will get help right away if you are not doing well or get worse.  Your e-visit answers were reviewed by a board certified advanced clinical practitioner to complete your personal care plan.  Depending on the condition, your plan could have included both over the counter or prescription medications.  If there is a problem please reply  once you have received a response from your provider.  Your safety is important to us .  If you have drug allergies check your prescription carefully.    You can use MyChart to ask questions about today's visit, request a non-urgent call back, or ask for a work or school excuse for 24 hours related to this e-Visit. If it has been greater than 24 hours you will need to follow up with your provider, or enter a new e-Visit to address those concerns.  You will get an e-mail in the next two days asking about your experience.  I hope that your  e-visit has been valuable and will speed your recovery. Thank you for using e-visits.   I have spent 5 minutes in review of e-visit questionnaire, review and updating patient chart, medical decision making and response to patient.   Delon CHRISTELLA Dickinson, PA-C

## 2024-08-09 ENCOUNTER — Other Ambulatory Visit (HOSPITAL_COMMUNITY): Payer: Self-pay

## 2024-08-21 ENCOUNTER — Other Ambulatory Visit (HOSPITAL_COMMUNITY): Payer: Self-pay

## 2024-09-06 ENCOUNTER — Other Ambulatory Visit: Payer: Self-pay | Admitting: Neurology

## 2024-09-06 ENCOUNTER — Other Ambulatory Visit (HOSPITAL_COMMUNITY): Payer: Self-pay

## 2024-09-06 DIAGNOSIS — F5104 Psychophysiologic insomnia: Secondary | ICD-10-CM

## 2024-09-06 DIAGNOSIS — F5105 Insomnia due to other mental disorder: Secondary | ICD-10-CM

## 2024-09-07 NOTE — Telephone Encounter (Signed)
 Pt is asking to be called with the status of this refill request,

## 2024-09-08 ENCOUNTER — Other Ambulatory Visit (HOSPITAL_COMMUNITY): Payer: Self-pay

## 2024-09-08 NOTE — Telephone Encounter (Signed)
 Last seen 05/15/24 Next appt 01/15/25 Dispenses   Dispensed Days Supply Quantity Provider Pharmacy  zolpidem  (AMBIEN  CR) 12.5 MG CR tablet 08/09/2024 30 30 tablet Dohmeier, Dedra, MD [facility]  zolpidem  (AMBIEN  CR) 12.5 MG CR tablet 07/10/2024 30 30 tablet Dohmeier, Dedra, MD [place] [facility]  zolpidem  (AMBIEN  CR) 12.5 MG CR tablet 06/10/2024 30 30 tablet Dohmeier, Dedra, MD [place] [facility]  zolpidem  (AMBIEN  CR) 12.5 MG CR tablet 05/04/2024 30 30 tablet Dohmeier, Dedra, MD [place] [facility]  zolpidem  (AMBIEN  CR) 12.5 MG CR tablet 04/07/2024 30 30 tablet Dohmeier, Dedra, MD [place] [facility]  zolpidem  (AMBIEN  CR) 12.5 MG CR tablet 03/08/2024 30 30 tablet Dohmeier, Dedra, MD [place] [facility]  zolpidem  (AMBIEN  CR) 12.5 MG CR tablet 02/04/2024 30 30 tablet Dohmeier, Dedra, MD [place] [facility]  zolpidem  (AMBIEN  CR) 12.5 MG CR tablet 01/07/2024 30 30 tablet Dohmeier, Dedra, [facility] [place] [facility]  zolpidem  (AMBIEN  CR) 12.5 MG CR tablet 12/10/2023 30 30 tablet Dohmeier, Dedra, MD [place] [facility]  zolpidem  (AMBIEN  CR) 12.5 MG CR tablet 11/12/2023 30 30 tablet Dohmeier, Dedra, MD [place] [facility]  zolpidem  (AMBIEN  CR) 12.5 MG CR tablet 10/15/2023 30 30 tablet Dohmeier, Dedra, MD [place] [facility]  zolpidem  (AMBIEN  CR) 12.5 MG CR tablet 09/17/2023 30 30 tablet Dohmeier, Dedra, MD [place] [facility][email or web address]

## 2024-09-08 NOTE — Telephone Encounter (Signed)
 Pt called to request medication refill zolpidem  (AMBIEN  CR) 12.5 MG CR tablet   Pt medication is to be sent to    [facility] (Ph: [phone])

## 2024-09-10 MED ORDER — ZOLPIDEM TARTRATE ER 12.5 MG PO TBCR
12.5000 mg | EXTENDED_RELEASE_TABLET | Freq: Every evening | ORAL | 2 refills | Status: AC | PRN
  Filled 2024-09-10: qty 30, 30d supply, fill #0
  Filled 2024-10-10: qty 30, 30d supply, fill #1
  Filled 2024-11-08: qty 30, 30d supply, fill #2

## 2024-09-11 ENCOUNTER — Other Ambulatory Visit (HOSPITAL_COMMUNITY): Payer: Self-pay

## 2024-10-06 ENCOUNTER — Other Ambulatory Visit (HOSPITAL_COMMUNITY): Payer: Self-pay

## 2024-10-09 ENCOUNTER — Other Ambulatory Visit (HOSPITAL_COMMUNITY): Payer: Self-pay

## 2024-10-10 ENCOUNTER — Other Ambulatory Visit: Payer: Self-pay

## 2024-11-04 ENCOUNTER — Other Ambulatory Visit (HOSPITAL_COMMUNITY): Payer: Self-pay

## 2024-11-04 ENCOUNTER — Telehealth: Admitting: Family Medicine

## 2024-11-04 DIAGNOSIS — J208 Acute bronchitis due to other specified organisms: Secondary | ICD-10-CM

## 2024-11-04 DIAGNOSIS — B9689 Other specified bacterial agents as the cause of diseases classified elsewhere: Secondary | ICD-10-CM

## 2024-11-04 MED ORDER — BENZONATATE 100 MG PO CAPS
100.0000 mg | ORAL_CAPSULE | Freq: Two times a day (BID) | ORAL | 0 refills | Status: AC | PRN
  Filled 2024-11-04: qty 20, 10d supply, fill #0

## 2024-11-04 MED ORDER — AZITHROMYCIN 250 MG PO TABS
ORAL_TABLET | ORAL | 0 refills | Status: AC
  Filled 2024-11-04: qty 6, 5d supply, fill #0

## 2024-11-04 NOTE — Progress Notes (Signed)
 We are sorry that you are not feeling well.  Here is how we plan to help!  Based on your presentation I believe you most likely have A cough due to bacteria.  When patients have a fever and a productive cough with a change in color or increased sputum production, we are concerned about bacterial bronchitis.  If left untreated it can progress to pneumonia.  If your symptoms do not improve with your treatment plan it is important that you contact your provider.   I have prescribed Azithromyin 250 mg: two tablets now and then one tablet daily for 4 additonal days    In addition you may use A prescription cough medication called Tessalon  Perles 100mg . You may take 1-2 capsules every 8 hours as needed for your cough.  From your responses in the eVisit questionnaire you describe inflammation in the upper respiratory tract which is causing a significant cough.  This is commonly called Bronchitis and has four common causes:   Allergies Viral Infections Acid Reflux Bacterial Infection Allergies, viruses and acid reflux are treated by controlling symptoms or eliminating the cause. An example might be a cough caused by taking certain blood pressure medications. You stop the cough by changing the medication. Another example might be a cough caused by acid reflux. Controlling the reflux helps control the cough.  USE OF BRONCHODILATOR (RESCUE) INHALERS: There is a risk from using your bronchodilator too frequently.  The risk is that over-reliance on a medication which only relaxes the muscles surrounding the breathing tubes can reduce the effectiveness of medications prescribed to reduce swelling and congestion of the tubes themselves.  Although you feel brief relief from the bronchodilator inhaler, your asthma may actually be worsening with the tubes becoming more swollen and filled with mucus.  This can delay other crucial treatments, such as oral steroid medications. If you need to use a bronchodilator inhaler  daily, several times per day, you should discuss this with your provider.  There are probably better treatments that could be used to keep your asthma under control.     HOME CARE Only take medications as instructed by your medical team. Complete the entire course of an antibiotic. Drink plenty of fluids and get plenty of rest. Avoid close contacts especially the very young and the elderly Cover your mouth if you cough or cough into your sleeve. Always remember to wash your hands A steam or ultrasonic humidifier can help congestion.   GET HELP RIGHT AWAY IF: You develop worsening fever. You become short of breath You cough up blood. Your symptoms persist after you have completed your treatment plan MAKE SURE YOU  Understand these instructions. Will watch your condition. Will get help right away if you are not doing well or get worse.  Your e-visit answers were reviewed by a board certified advanced clinical practitioner to complete your personal care plan.  Depending on the condition, your plan could have included both over the counter or prescription medications. If there is a problem please reply  once you have received a response from your provider. Your safety is important to us .  If you have drug allergies check your prescription carefully.    You can use MyChart to ask questions about today's visit, request a non-urgent call back, or ask for a work or school excuse for 24 hours related to this e-Visit. If it has been greater than 24 hours you will need to follow up with your provider, or enter a new e-Visit to  address those concerns. You will get an e-mail in the next two days asking about your experience.  I hope that your e-visit has been valuable and will speed your recovery. Thank you for using e-visits.   I have spent 5 minutes in review of e-visit questionnaire, review and updating patient chart, medical decision making and response to patient.   Mart Colpitts, FNP

## 2024-11-08 ENCOUNTER — Other Ambulatory Visit (HOSPITAL_COMMUNITY): Payer: Self-pay

## 2024-11-08 ENCOUNTER — Other Ambulatory Visit: Payer: Self-pay

## 2024-12-07 ENCOUNTER — Other Ambulatory Visit (HOSPITAL_COMMUNITY): Payer: Self-pay

## 2024-12-07 ENCOUNTER — Other Ambulatory Visit: Payer: Self-pay | Admitting: Neurology

## 2024-12-07 DIAGNOSIS — F5105 Insomnia due to other mental disorder: Secondary | ICD-10-CM

## 2024-12-07 DIAGNOSIS — F5104 Psychophysiologic insomnia: Secondary | ICD-10-CM

## 2024-12-08 NOTE — Telephone Encounter (Signed)
 Pt has called to check on the refill status, she states she is out of this medication.

## 2025-01-15 ENCOUNTER — Ambulatory Visit: Admitting: Family Medicine
